# Patient Record
Sex: Female | Born: 1980 | Race: Black or African American | Hispanic: No | Marital: Single | Smoking: Never smoker
Health system: Southern US, Community
[De-identification: ages and names within clinical notes are randomized; demographics above are authoritative.]

## PROBLEM LIST (undated history)

## (undated) DIAGNOSIS — I10 Essential (primary) hypertension: Secondary | ICD-10-CM

## (undated) DIAGNOSIS — D649 Anemia, unspecified: Secondary | ICD-10-CM

## (undated) DIAGNOSIS — K6289 Other specified diseases of anus and rectum: Secondary | ICD-10-CM

## (undated) DIAGNOSIS — R0602 Shortness of breath: Secondary | ICD-10-CM

## (undated) DIAGNOSIS — F419 Anxiety disorder, unspecified: Secondary | ICD-10-CM

## (undated) DIAGNOSIS — A4902 Methicillin resistant Staphylococcus aureus infection, unspecified site: Secondary | ICD-10-CM

## (undated) DIAGNOSIS — F32A Depression, unspecified: Secondary | ICD-10-CM

## (undated) DIAGNOSIS — N390 Urinary tract infection, site not specified: Secondary | ICD-10-CM

## (undated) DIAGNOSIS — O24419 Gestational diabetes mellitus in pregnancy, unspecified control: Secondary | ICD-10-CM

## (undated) DIAGNOSIS — F329 Major depressive disorder, single episode, unspecified: Secondary | ICD-10-CM

## (undated) DIAGNOSIS — B009 Herpesviral infection, unspecified: Secondary | ICD-10-CM

## (undated) DIAGNOSIS — O359XX0 Maternal care for (suspected) fetal abnormality and damage, unspecified, not applicable or unspecified: Secondary | ICD-10-CM

## (undated) HISTORY — PX: OTHER SURGICAL HISTORY: SHX169

## (undated) HISTORY — DX: Anxiety disorder, unspecified: F41.9

## (undated) HISTORY — DX: Major depressive disorder, single episode, unspecified: F32.9

## (undated) HISTORY — DX: Maternal care for (suspected) fetal abnormality and damage, unspecified, not applicable or unspecified: O35.9XX0

## (undated) HISTORY — DX: Anemia, unspecified: D64.9

## (undated) HISTORY — DX: Depression, unspecified: F32.A

## (undated) HISTORY — DX: Essential (primary) hypertension: I10

## (undated) HISTORY — DX: Methicillin resistant Staphylococcus aureus infection, unspecified site: A49.02

---

## 2002-04-22 ENCOUNTER — Encounter: Admission: RE | Admit: 2002-04-22 | Discharge: 2002-04-22 | Payer: Self-pay | Admitting: *Deleted

## 2002-11-07 ENCOUNTER — Emergency Department (HOSPITAL_COMMUNITY): Admission: EM | Admit: 2002-11-07 | Discharge: 2002-11-07 | Payer: Self-pay | Admitting: Emergency Medicine

## 2002-11-08 ENCOUNTER — Encounter: Payer: Self-pay | Admitting: *Deleted

## 2003-02-05 ENCOUNTER — Ambulatory Visit (HOSPITAL_COMMUNITY): Admission: RE | Admit: 2003-02-05 | Discharge: 2003-02-05 | Payer: Self-pay | Admitting: Obstetrics & Gynecology

## 2003-02-05 ENCOUNTER — Encounter: Payer: Self-pay | Admitting: Obstetrics & Gynecology

## 2003-04-30 ENCOUNTER — Encounter: Payer: Self-pay | Admitting: Obstetrics

## 2003-04-30 ENCOUNTER — Inpatient Hospital Stay (HOSPITAL_COMMUNITY): Admission: AD | Admit: 2003-04-30 | Discharge: 2003-04-30 | Payer: Self-pay | Admitting: Obstetrics

## 2003-06-23 ENCOUNTER — Inpatient Hospital Stay (HOSPITAL_COMMUNITY): Admission: AD | Admit: 2003-06-23 | Discharge: 2003-06-23 | Payer: Self-pay | Admitting: Obstetrics

## 2003-07-05 ENCOUNTER — Inpatient Hospital Stay (HOSPITAL_COMMUNITY): Admission: AD | Admit: 2003-07-05 | Discharge: 2003-07-05 | Payer: Self-pay | Admitting: Obstetrics

## 2003-07-07 ENCOUNTER — Ambulatory Visit (HOSPITAL_COMMUNITY): Admission: RE | Admit: 2003-07-07 | Discharge: 2003-07-07 | Payer: Self-pay | Admitting: Obstetrics & Gynecology

## 2003-07-14 ENCOUNTER — Inpatient Hospital Stay (HOSPITAL_COMMUNITY): Admission: AD | Admit: 2003-07-14 | Discharge: 2003-07-19 | Payer: Self-pay | Admitting: Obstetrics

## 2003-07-16 ENCOUNTER — Encounter (INDEPENDENT_AMBULATORY_CARE_PROVIDER_SITE_OTHER): Payer: Self-pay | Admitting: *Deleted

## 2004-01-14 ENCOUNTER — Emergency Department (HOSPITAL_COMMUNITY): Admission: EM | Admit: 2004-01-14 | Discharge: 2004-01-14 | Payer: Self-pay | Admitting: Emergency Medicine

## 2004-07-12 ENCOUNTER — Encounter: Admission: RE | Admit: 2004-07-12 | Discharge: 2004-07-12 | Payer: Self-pay | Admitting: *Deleted

## 2004-08-19 ENCOUNTER — Ambulatory Visit (HOSPITAL_COMMUNITY): Admission: RE | Admit: 2004-08-19 | Discharge: 2004-08-19 | Payer: Self-pay | Admitting: Obstetrics & Gynecology

## 2005-01-06 ENCOUNTER — Inpatient Hospital Stay (HOSPITAL_COMMUNITY): Admission: RE | Admit: 2005-01-06 | Discharge: 2005-01-09 | Payer: Self-pay | Admitting: Obstetrics & Gynecology

## 2007-06-26 ENCOUNTER — Ambulatory Visit: Payer: Self-pay | Admitting: Internal Medicine

## 2007-11-27 ENCOUNTER — Emergency Department (HOSPITAL_COMMUNITY): Admission: EM | Admit: 2007-11-27 | Discharge: 2007-11-28 | Payer: Self-pay | Admitting: Emergency Medicine

## 2008-11-13 ENCOUNTER — Emergency Department: Payer: Self-pay | Admitting: Emergency Medicine

## 2009-03-07 ENCOUNTER — Observation Stay: Payer: Self-pay | Admitting: Obstetrics and Gynecology

## 2009-04-29 ENCOUNTER — Ambulatory Visit (HOSPITAL_COMMUNITY): Admission: RE | Admit: 2009-04-29 | Discharge: 2009-04-29 | Payer: Self-pay | Admitting: Obstetrics & Gynecology

## 2009-05-04 ENCOUNTER — Inpatient Hospital Stay (HOSPITAL_COMMUNITY): Admission: AD | Admit: 2009-05-04 | Discharge: 2009-05-04 | Payer: Self-pay | Admitting: Obstetrics & Gynecology

## 2009-05-18 ENCOUNTER — Encounter: Payer: Self-pay | Admitting: Obstetrics & Gynecology

## 2009-05-18 ENCOUNTER — Inpatient Hospital Stay (HOSPITAL_COMMUNITY): Admission: RE | Admit: 2009-05-18 | Discharge: 2009-05-21 | Payer: Self-pay | Admitting: Obstetrics & Gynecology

## 2009-05-22 ENCOUNTER — Encounter: Admission: RE | Admit: 2009-05-22 | Discharge: 2009-06-20 | Payer: Self-pay | Admitting: Obstetrics & Gynecology

## 2009-06-21 ENCOUNTER — Encounter: Admission: RE | Admit: 2009-06-21 | Discharge: 2009-07-08 | Payer: Self-pay | Admitting: Obstetrics & Gynecology

## 2009-07-22 ENCOUNTER — Encounter: Admission: RE | Admit: 2009-07-22 | Discharge: 2009-08-21 | Payer: Self-pay | Admitting: Obstetrics & Gynecology

## 2009-08-22 ENCOUNTER — Encounter: Admission: RE | Admit: 2009-08-22 | Discharge: 2009-08-26 | Payer: Self-pay | Admitting: Obstetrics & Gynecology

## 2009-09-24 ENCOUNTER — Emergency Department (HOSPITAL_COMMUNITY): Admission: EM | Admit: 2009-09-24 | Discharge: 2009-09-24 | Payer: Self-pay | Admitting: Emergency Medicine

## 2009-10-04 ENCOUNTER — Emergency Department: Payer: Self-pay | Admitting: Emergency Medicine

## 2009-12-04 ENCOUNTER — Emergency Department: Payer: Self-pay | Admitting: Emergency Medicine

## 2009-12-31 ENCOUNTER — Ambulatory Visit: Payer: Self-pay | Admitting: Family Medicine

## 2010-03-17 ENCOUNTER — Emergency Department: Payer: Self-pay | Admitting: Emergency Medicine

## 2010-07-10 DIAGNOSIS — O359XX0 Maternal care for (suspected) fetal abnormality and damage, unspecified, not applicable or unspecified: Secondary | ICD-10-CM

## 2010-07-10 HISTORY — DX: Maternal care for (suspected) fetal abnormality and damage, unspecified, not applicable or unspecified: O35.9XX0

## 2010-07-30 ENCOUNTER — Encounter: Payer: Self-pay | Admitting: Obstetrics & Gynecology

## 2010-08-01 ENCOUNTER — Encounter: Payer: Self-pay | Admitting: Obstetrics & Gynecology

## 2010-10-02 LAB — LIPASE, BLOOD: Lipase: 37 U/L (ref 11–59)

## 2010-10-02 LAB — URINALYSIS, ROUTINE W REFLEX MICROSCOPIC
Bilirubin Urine: NEGATIVE
Ketones, ur: NEGATIVE mg/dL
Nitrite: NEGATIVE
Protein, ur: NEGATIVE mg/dL
Urobilinogen, UA: 0.2 mg/dL (ref 0.0–1.0)

## 2010-10-02 LAB — COMPREHENSIVE METABOLIC PANEL
ALT: 23 U/L (ref 0–35)
Albumin: 3.8 g/dL (ref 3.5–5.2)
Calcium: 9.5 mg/dL (ref 8.4–10.5)
GFR calc Af Amer: >60 mL/min (ref >60)
GFR calc non Af Amer: >60 mL/min (ref >60)
Glucose, Bld: 129 mg/dL — ABNORMAL HIGH (ref 70–99)
Sodium: 137 mEq/L (ref 135–145)
Total Protein: 7 g/dL (ref 6.0–8.3)

## 2010-10-02 LAB — DIFFERENTIAL
Basophils Absolute: 0 10*3/uL (ref 0.0–0.1)
Basophils Relative: 0 % (ref 0–1)
Eosinophils Relative: 4 % (ref 0–5)
Lymphocytes Relative: 35 % (ref 12–46)
Neutro Abs: 3.9 10*3/uL (ref 1.7–7.7)

## 2010-10-02 LAB — CBC
Hemoglobin: 12.1 g/dL (ref 12.0–15.0)
MCHC: 32.8 g/dL (ref 30.0–36.0)
Platelets: 244 10*3/uL (ref 150–400)
RDW: 16 % — ABNORMAL HIGH (ref 11.5–15.5)

## 2010-10-11 LAB — CBC: Hemoglobin: 11.5 g/dL — AB (ref 12.0–16.0)

## 2010-10-11 LAB — HEPATITIS B SURFACE ANTIGEN: Hepatitis B Surface Ag: NEGATIVE

## 2010-10-11 LAB — RPR: RPR: NONREACTIVE

## 2010-10-11 LAB — GC/CHLAMYDIA PROBE AMP, GENITAL: Chlamydia: NEGATIVE

## 2010-10-11 LAB — ABO/RH

## 2010-10-11 LAB — HIV ANTIBODY (ROUTINE TESTING W REFLEX): HIV: NONREACTIVE

## 2010-10-12 LAB — CBC
HCT: 25.4 % — ABNORMAL LOW (ref 36.0–46.0)
HCT: 31.6 % — ABNORMAL LOW (ref 36.0–46.0)
Hemoglobin: 8.3 g/dL — ABNORMAL LOW (ref 12.0–15.0)
MCHC: 33.2 g/dL (ref 30.0–36.0)
MCV: 78.3 fL (ref 78.0–100.0)
MCV: 79 fL (ref 78.0–100.0)
RBC: 3.22 MIL/uL — ABNORMAL LOW (ref 3.87–5.11)
RBC: 4.04 MIL/uL (ref 3.87–5.11)
WBC: 11.7 10*3/uL — ABNORMAL HIGH (ref 4.0–10.5)
WBC: 11.7 10*3/uL — ABNORMAL HIGH (ref 4.0–10.5)

## 2010-10-12 LAB — GLUCOSE, CAPILLARY: Glucose-Capillary: 89 mg/dL (ref 70–99)

## 2010-10-12 LAB — RPR: RPR Ser Ql: NONREACTIVE

## 2010-10-12 LAB — TYPE AND SCREEN: Antibody Screen: NEGATIVE

## 2010-10-13 LAB — URINALYSIS, ROUTINE W REFLEX MICROSCOPIC
Bilirubin Urine: NEGATIVE
Glucose, UA: NEGATIVE mg/dL
Hgb urine dipstick: NEGATIVE
Ketones, ur: NEGATIVE mg/dL
Nitrite: NEGATIVE
Specific Gravity, Urine: 1.015 (ref 1.005–1.030)
pH: 7.5 (ref 5.0–8.0)

## 2010-10-13 LAB — GLUCOSE, CAPILLARY: Glucose-Capillary: 88 mg/dL (ref 70–99)

## 2010-10-13 LAB — URINE MICROSCOPIC-ADD ON

## 2010-10-23 ENCOUNTER — Emergency Department: Payer: Self-pay | Admitting: Emergency Medicine

## 2010-10-28 ENCOUNTER — Emergency Department: Payer: Self-pay | Admitting: Emergency Medicine

## 2010-11-25 NOTE — Op Note (Signed)
Lynn Vargas, [place] NO.:  [id number]   MEDICAL RECORD NO.:  [id number]          PATIENT TYPE:  INP   LOCATION:  9199                          FACILITY:  [facility]   PHYSICIAN:  Roseanna Rainbow, M.D.DATE OF BIRTH:  07/10/1981   DATE OF PROCEDURE:  01/06/2005  DATE OF DISCHARGE:                                 OPERATIVE REPORT   PREOPERATIVE DIAGNOSES:  Term intrauterine pregnancy, history of a previous  cesarean delivery, declines trial of labor.   POSTOPERATIVE DIAGNOSIS:  Term intrauterine pregnancy, history of a previous  cesarean delivery, declines trial of labor.   PROCEDURE:  Repeat low transverse cesarean section via Pfannenstiel.   SURGEON:  1.  Dr. Tamela Oddi.  2.  Dr. Clearance Coots   ANESTHESIA:  Spinal.   COMPLICATIONS:  None.   ESTIMATED BLOOD LOSS:  80-100 mL.   FLUIDS AND URINE OUTPUT:  As per anesthesiology.   INDICATIONS:  The patient is a 30 year old para 1 with an intrauterine  pregnancy at term for an elective cesarean delivery.   FINDINGS:  Infant in cephalic presentation.  Neonatology present at  delivery.   PROCEDURES:  The patient was taken to the operating room where spinal  anesthetic was administered and found to be adequate. She was then prepped  and draped in the usual sterile fashion in the dorsal supine position with a  leftward tilt.  A Pfannenstiel skin incision was then made with the scalpel  and carried through to the underlying layer of fascia. The fascia was nicked  in the midline and the incision extended laterally with Mayo scissors.  The  superior aspect of the fascial incision was then grasped with the Kocher  clamps, elevated, and the underlying rectus muscles dissected off.  The  inferior aspect of the incision was manipulated in a similar fashion.  The  rectus muscles were separated in the midline.  The parietoperitoneum was  tented up and entered sharply with Metzenbaum scissors.  The peritoneal  incision  was then extended superiorly and inferiorly with good visualization  of the bladder.  The bladder blade was then inserted and the vesicouterine  peritoneum identified, grasped with pickups, and entered sharply with  Metzenbaum scissors.  This incision was then extended laterally and the  bladder flap created sharply.  The bladder blade was then reinserted and the  lower uterine segment incised in a transverse fashion with the scalpel.  The  uterine incision was then extended bluntly.  The bladder blade was removed  and the infant's head delivered atraumatically.  The nose and mouth were  suctioned with bulb suction and the cord was clamped and cut. The infant was  handed off to the awaiting neonatologist.  The placenta was then removed.  The uterus was cleared of all clots and debris.  The uterine incision was  repaired with O Monocryl in a running locked fashion.  A second layer of the  same suture was used to obtain hemostasis.  The gutters were cleared of all  clots.  The peritoneum was closed with 2-0 Vicryl.  The fascia was  reapproximated  with O PDS in a running fashion.  The skin was closed in a  subcuticular fashion with 3-0 Monocryl.  The patient tolerated the procedure  well.  Sponge, lap, and needle counts were correct x2.  One gram of  cephazolin was given at cord clamp.  The patient was taken to the PACU in  stable condition.       LAJ/MEDQ  D:  01/06/2005  T:  01/06/2005  Job:  [id number]

## 2010-11-25 NOTE — Op Note (Signed)
NAME:  Lynn Vargas, Lynn Vargas NO.:  [id number]   MEDICAL RECORD NO.:  [id number]                   PATIENT TYPE:  INP   LOCATION:  9111                                 [facility]  [facility]   PHYSICIAN:  Roseanna Rainbow, M.D.         DATE OF BIRTH:  [birth year]   DATE OF PROCEDURE:  07/16/2003  DATE OF DISCHARGE:                                 OPERATIVE REPORT   PREOPERATIVE DIAGNOSES:  1. Forty-one plus week intrauterine pregnancy.  2. Failed induction.  3. Meconium-stained amniotic fluid.   POSTOPERATIVE DIAGNOSES:  1. Forty-one plus week intrauterine pregnancy.  2. Failed induction.  3. Meconium-stained amniotic fluid.   PROCEDURE:  Primary low transverse cesarean section via Pfannenstiel.   SURGEON:  Roseanna Rainbow, M.D.   ANESTHESIA:  Epidural.   COMPLICATIONS:  None.   ESTIMATED BLOOD LOSS:  1 L.   Fluids and urine output as per anesthesiology.   INDICATIONS:  The patient is a 30 year old G1, P0, at 41+ weeks, induced for  post dates.  Maximum dilatation 2 cm.   FINDINGS:  A female infant in cephalic presentation.  Neonatology present at  delivery.  Normal uterus, tubes, and ovaries.   DESCRIPTION OF PROCEDURE:  The patient was taken to the operating room,  where her epidural anesthetic was found to be adequate.  She was then  prepped and draped in the usual sterile fashion in the dorsal supine  position with a leftward tilt.  A Pfannenstiel skin incision was then made  with the scalpel and carried through to the underlying layer of fascia.  The  fascia was then incised in the midline and the incision extended laterally  with Mayo scissors.  The superior aspect of the fascial incision was then  grasped with Kocher clamps, elevated, and the underlying rectus muscles  dissected off.  Attention was then turned to the inferior aspect of this  incision, which was manipulated in a similar fashion.  The rectus muscles  were separated in the  midline.  The parietal peritoneum was identified,  tented up, and entered sharply with the Metzenbaum scissors.  The peritoneal  incision was then extended superiorly and inferiorly with good visualization  of the bladder.  The bladder blade was then inserted and the vesicouterine  peritoneum, identified, grasped with pickups, and entered sharply with the  Metzenbaum scissors.  This incision was then extended laterally and the  bladder flap created digitally.  The bladder blade was then reinserted in  the lower uterine segment, incised in a transverse fashion with the scalpel.  The uterine incision was then extended laterally with the bandage scissors.  The bladder blade was then removed and the infant's head delivered  atraumatically.  The nose and mouth were suctioned with a DeLee suction trap  and the cord was clamped and cut.  The infant was handed off to the awaiting  neonatologist.  Cord gases were sent.  The  placenta was then removed, the  uterus cleared of all clots and debris.  The uterine incision was repaired  with 0 Monocryl in a running locked fashion.  A second layer of the same  suture was used to obtain adequate hemostasis.  The gutters were cleared of  all clots.  The parietal peritoneum was closed with 2-0 Vicryl.  The fascia  was reapproximated with 0 Vicryl in a running fashion.  The skin was closed  with staples.  The patient tolerated the procedure well.  Sponge, lap, and  needle counts were correct x2.  Cefazolin 1 g was given at cord clamp.  The  patient was taken to the PACU in stable condition.                                               Roseanna Rainbow, M.D.    Judee Clara  D:  07/16/2003  T:  07/16/2003  Job:  [id number]

## 2010-11-25 NOTE — Discharge Summary (Signed)
Lynn Vargas, Lynn Vargas                ACCOUNT NO.:  [id number]   MEDICAL RECORD NO.:  [id number]          PATIENT TYPE:  INP   LOCATION:  9117                          [facility]   PHYSICIAN:  Charles A. Clearance Coots, M.D.DATE OF BIRTH:  [birth year]   DATE OF ADMISSION:  01/06/2005  DATE OF DISCHARGE:                                 DISCHARGE SUMMARY   ADMITTING DIAGNOSES:  1.  Thirty and six-sevenths weeks gestation.  2.  Previous cesarean section, desired repeat cesarean section.   DISCHARGE DIAGNOSES:  1.  Thirty and six-sevenths weeks gestation.  2.  Previous cesarean section, desired repeat cesarean section.  3.  Status post delivery by cesarean section, viable female infant, on December 27, 2004 at 11:08. Apgars of 9 at one minute, 9 at five minutes, weight      of 2990 g, length of 47 cm. Mother and infant discharged home in good      condition.   REASON FOR ADMISSION:  A 30 year old black female G3 P1 at 93 and six-  sevenths weeks gestation. History of previous cesarean section, desired  repeat cesarean section. Prenatal care was uncomplicated except for history  of positive herpes.   PAST MEDICAL HISTORY:  Surgery:  Cesarean section and pilonidal cyst  resection. Illnesses:  Herpes. Medications:  Prenatal vitamins, Valtrex.  Allergies:  No known drug allergies.   SOCIAL HISTORY:  Single. Negative tobacco, alcohol, or recreational drug  use.   PHYSICAL EXAMINATION:  VITAL SIGNS:  Afebrile, blood pressure 130/84, weight  226 pounds.  LUNGS:  Clear to auscultation bilaterally.  HEART:  Regular rate and rhythm.  ABDOMEN:  Gravid, nontender.  PELVIC:  Cervix is long and closed.   ADMITTING LABORATORY VALUES:  Hemoglobin 10.4, hematocrit 30. Rubella  nonimmune.   HOSPITAL COURSE:  The patient underwent a repeat low transverse cesarean  section on January 06, 2005. There were no intraoperative complications.  Postoperative course was uncomplicated and the patient  was discharged home  on postoperative day #3 in good condition.   DISCHARGE LABORATORY VALUES:  Hemoglobin 9.0, hematocrit 26.5.   DISCHARGE DISPOSITION:  1.  Medications:  Tylox, ibuprofen, and Valtrex were prescribed.  2.  Routine written instructions for obstetrical discharge after cesarean      section was given by booklet.  3.  The patient is to call the office for a follow-up appointment in 2      weeks.       CAH/MEDQ  D:  01/09/2005  T:  01/09/2005  Job:  [id number]

## 2010-11-25 NOTE — Discharge Summary (Signed)
NAME:  Lynn Vargas, Lynn Vargas NO.:  [id number]   MEDICAL RECORD NO.:  [id number]                   PATIENT TYPE:  INP   LOCATION:  9111                                 [facility]   PHYSICIAN:  Roseanna Rainbow, M.D.         DATE OF BIRTH:  28-Aug-1980   DATE OF ADMISSION:  07/14/2003  DATE OF DISCHARGE:  07/19/2003                                 DISCHARGE SUMMARY   CHIEF COMPLAINT:  The patient is a 30 year old, gravida 2, para 0, with an  intrauterine pregnancy at 41-3/7 weeks who presents for induction of labor  secondary to post dates.   HISTORY OF PRESENT ILLNESS:  As above. The patient denies any contractions,  rupture of membranes, vaginal bleeding. She of course has good fetal  movement, prenatal course, onset of care at 10 weeks. Pregnancy,  complications and risks, sickle cell trait, history of genital herpes, and  GBS bacteruria.   MEDICATIONS:  1. Prenatal vitamins.  2. Valtrex.  3. Medigesic.   ALLERGIES:  No known drug allergies.   PAST OBSTETRICS HISTORY:  Spontaneous abortion first trimester.   PAST GYNECOLOGIC HISTORY:  See above.   PAST MEDICAL HISTORY:  Gastritis.   PAST SURGICAL HISTORY:  Pilonidal cystectomy.   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 [place]   PRENATAL LABORATORY:  O positive, antibody screen negative, rubella immune,  hepatitis B surface antigen negative. With recent ultrasound at 31 weeks,  estimated fetal weight percentile of 75th, normal AFI.   PHYSICAL EXAMINATION:  VITAL SIGNS: Stable and afebrile.  GENERAL: Well-developed, in no apparent distress.  ABDOMEN: Gravida and nontender.  PELVIC EXAM: Deferred. Fetal  heart rate baseline 140s, good long term  variability, reactive; mild variable decelerations. No contractions.   ASSESSMENT:  Intrauterine pregnancy at 41+ weeks.   PLAN:  Induction of labor.   HOSPITAL COURSE:  The patient was admitted. The cervix was ripened  with  prostaglandin. Artificial rupture of membranes was performed, late in labor,  and meconium stained fluid was noted.  An amnio-infusion was started in  labor.  Despite appropriate Pitocin administration and adequate amount of  __________ units, the patient failed to progress beyond 3 cm.  At this point  the patient was taken to the operating room for Cesarean delivery secondary  to failed induction of labor.  Please see the dictated operative summary for  further details.  Her postoperative course was remarkable for mild  tachycardia, mild anemia with a hemoglobin of 8.6 on postoperative day #1.  EKG and oxygen saturations were normal. She was discharged to home on  postoperative day #3, tolerating a regular diet.   DISCHARGE DIAGNOSES:  1. Intrauterine pregnancy at 41+ weeks.  2. Failed induction of labor.   PROCEDURE:  Cesarean delivery.   CONDITION:  Stable.   DIET:  Regular.   ACTIVITY:  No strenuous activity.  Pelvic rest.   MEDICATIONS:  Percocet  one to two tablets q.6h. p.r.n.   DISPOSITION:  The patient is to follow up in the office in one week.                                               Roseanna Rainbow, M.D.    Judee Clara  D:  09/03/2003  T:  09/03/2003  Job:  [id number]

## 2011-01-18 ENCOUNTER — Inpatient Hospital Stay (HOSPITAL_COMMUNITY)
Admission: EM | Admit: 2011-01-18 | Discharge: 2011-01-18 | Disposition: A | Discharge status: Home / Self Care | Payer: BC Managed Care – PPO | Source: Ambulatory Visit | Attending: Obstetrics | Admitting: Obstetrics

## 2011-01-18 ENCOUNTER — Encounter (HOSPITAL_COMMUNITY): Payer: Self-pay

## 2011-01-18 ENCOUNTER — Inpatient Hospital Stay (HOSPITAL_COMMUNITY): Admission: EM | Admit: 2011-01-18 | Payer: Self-pay | Source: Ambulatory Visit | Admitting: Obstetrics

## 2011-01-18 DIAGNOSIS — R109 Unspecified abdominal pain: Secondary | ICD-10-CM | POA: Insufficient documentation

## 2011-01-18 DIAGNOSIS — N949 Unspecified condition associated with female genital organs and menstrual cycle: Secondary | ICD-10-CM

## 2011-01-18 LAB — URINALYSIS, ROUTINE W REFLEX MICROSCOPIC
Bilirubin Urine: NEGATIVE
Ketones, ur: NEGATIVE mg/dL
Leukocytes, UA: NEGATIVE
Nitrite: NEGATIVE
Protein, ur: NEGATIVE mg/dL
pH: 6.5 (ref 5.0–8.0)

## 2011-01-18 LAB — URINE MICROSCOPIC-ADD ON

## 2011-01-18 NOTE — Progress Notes (Signed)
Pt states that she is having more cramping than usual with "pain". States she did have some leaking "fluid" this am but not now.

## 2011-01-18 NOTE — ED Provider Notes (Signed)
History   Pt presents today c/o lower abd cramping that is worse with movement. She states the pain comes and goes and is especially bad after working all day. When she rests, the pain improves. She denies vag dc, bleeding, fever, or any other sx at this time.  Chief Complaint  Patient presents with  . Abdominal Pain   HPI  OB History    Grav Para Term Preterm Abortions TAB SAB Ect Mult Living   4 3 3  0 0 0 0 0 0 3      No past medical history on file.  Past Surgical History  Procedure Date  . Cesarean section     Family History  Problem Relation Age of Onset  . Asthma Mother   . Diabetes Son     History  Substance Use Topics  . Smoking status: Never Smoker   . Smokeless tobacco: Never Used  . Alcohol Use: No    Allergies:  Allergies  Allergen Reactions  . Latex     rash    No prescriptions prior to admission    Review of Systems  Constitutional: Negative for fever and chills.  Cardiovascular: Negative for chest pain.  Gastrointestinal: Positive for abdominal pain. Negative for nausea, vomiting and diarrhea.  Genitourinary: Negative for dysuria, urgency, frequency and hematuria.  Neurological: Negative for dizziness and headaches.  Psychiatric/Behavioral: Negative for depression and suicidal ideas.   Physical Exam   Blood pressure 112/67, pulse 99, temperature 98.6 F (37 C), temperature source Oral, resp. rate 20, height 5\' 2"  (1.575 m), weight 237 lb 9.6 oz (107.775 kg).  Physical Exam  Constitutional: She is oriented to person, place, and time. She appears well-developed and well-nourished.  HENT:  Head: Normocephalic and atraumatic.  GI: She exhibits no distension. There is no tenderness. There is no rebound and no guarding.  Genitourinary: No bleeding around the vagina. No vaginal discharge found.       Cervix Lg/closed.  Neurological: She is alert and oriented to person, place, and time.  Skin: Skin is warm and dry.  Psychiatric: She has a  normal mood and affect. Her behavior is normal. Judgment and thought content normal.    MAU Course  Procedures  Results for orders placed during the hospital encounter of 01/18/11 (from the past 24 hour(s))  URINALYSIS, ROUTINE W REFLEX MICROSCOPIC     Status: Abnormal   Collection Time   01/18/11  3:11 PM      Component Value Range   Color, Urine STRAW (*) YELLOW    Appearance CLEAR  CLEAR    Specific Gravity, Urine 1.010  1.005 - 1.030    pH 6.5  5.0 - 8.0    Glucose, UA 100 (*) NEGATIVE (mg/dL)   Hgb urine dipstick TRACE (*) NEGATIVE    Bilirubin Urine NEGATIVE  NEGATIVE    Ketones NEGATIVE  NEGATIVE (mg/dL)   Protein NEGATIVE  NEGATIVE (mg/dL)   Urobilinogen, UA 0.2  0.0 - 1.0 (mg/dL)   Nitrite NEGATIVE  NEGATIVE    Leukocytes, UA NEGATIVE  NEGATIVE   URINE MICROSCOPIC-ADD ON     Status: Abnormal   Collection Time   01/18/11  3:11 PM      Component Value Range   Squamous Epithelial / LPF FEW (*) RARE    WBC, UA 0-2  <3 (WBC/hpf)   Bacteria, UA RARE  RARE     A/P: 1) Round ligament pain: discussed with pt at length. She has f/u scheduled with Dr. Verdell Carmine  office. Discussed diet, activity, risks, and precautions.    Henrietta Hoover, [place] 01/18/11 1621

## 2011-01-18 NOTE — Progress Notes (Signed)
Patient complaints of increased pain and cramping.Pt states she has had pain before but feels in has been worse today.Pt indicates cramping in her lower abdomen and pain radiating across her right side. Pt denies vaginal bleeding

## 2011-04-04 ENCOUNTER — Observation Stay: Payer: Self-pay

## 2011-04-05 LAB — URINALYSIS, ROUTINE W REFLEX MICROSCOPIC
Bilirubin Urine: NEGATIVE
Glucose, UA: NEGATIVE
Nitrite: NEGATIVE
Specific Gravity, Urine: 1.014
pH: 6.5

## 2011-04-05 LAB — COMPREHENSIVE METABOLIC PANEL
ALT: 18
AST: 17
Albumin: 3.5
Alkaline Phosphatase: 30 — ABNORMAL LOW
Chloride: 105
Potassium: 3.8
Sodium: 136
Total Protein: 6.5

## 2011-04-05 LAB — DIFFERENTIAL
Basophils Relative: 0
Eosinophils Absolute: 0.2
Eosinophils Relative: 3
Monocytes Absolute: 0.5
Monocytes Relative: 6

## 2011-04-05 LAB — CBC
Platelets: 178
RDW: 12.6
WBC: 7.8

## 2011-04-05 LAB — HCG, QUANTITATIVE, PREGNANCY: hCG, Beta Chain, Quant, S: 39751 — ABNORMAL HIGH

## 2011-04-05 LAB — LIPASE, BLOOD: Lipase: 32

## 2011-04-05 LAB — POCT PREGNANCY, URINE: Preg Test, Ur: POSITIVE

## 2011-04-11 ENCOUNTER — Encounter (HOSPITAL_COMMUNITY): Payer: Self-pay

## 2011-04-11 ENCOUNTER — Inpatient Hospital Stay (HOSPITAL_COMMUNITY)
Admission: AD | Admit: 2011-04-11 | Discharge: 2011-04-12 | DRG: 886 | Disposition: A | Discharge status: Other Acute Inpatient Hospital | Payer: BC Managed Care – PPO | Source: Ambulatory Visit | Attending: Obstetrics & Gynecology | Admitting: Obstetrics & Gynecology

## 2011-04-11 ENCOUNTER — Ambulatory Visit: Payer: Self-pay | Admitting: Obstetrics & Gynecology

## 2011-04-11 DIAGNOSIS — O358XX0 Maternal care for other (suspected) fetal abnormality and damage, not applicable or unspecified: Secondary | ICD-10-CM | POA: Diagnosis present

## 2011-04-11 DIAGNOSIS — IMO0002 Reserved for concepts with insufficient information to code with codable children: Secondary | ICD-10-CM | POA: Diagnosis present

## 2011-04-11 DIAGNOSIS — O99810 Abnormal glucose complicating pregnancy: Secondary | ICD-10-CM | POA: Diagnosis present

## 2011-04-11 DIAGNOSIS — O34219 Maternal care for unspecified type scar from previous cesarean delivery: Secondary | ICD-10-CM | POA: Diagnosis present

## 2011-04-11 DIAGNOSIS — O409XX0 Polyhydramnios, unspecified trimester, not applicable or unspecified: Principal | ICD-10-CM | POA: Diagnosis present

## 2011-04-11 HISTORY — DX: Other specified diseases of anus and rectum: K62.89

## 2011-04-11 HISTORY — DX: Shortness of breath: R06.02

## 2011-04-11 HISTORY — DX: Gestational diabetes mellitus in pregnancy, unspecified control: O24.419

## 2011-04-11 HISTORY — DX: Herpesviral infection, unspecified: B00.9

## 2011-04-11 HISTORY — DX: Urinary tract infection, site not specified: N39.0

## 2011-04-11 MED ORDER — CALCIUM CARBONATE ANTACID 500 MG PO CHEW
1.0000 | CHEWABLE_TABLET | Freq: Three times a day (TID) | ORAL | Status: DC | PRN
  Administered 2011-04-11: 200 mg via ORAL
  Filled 2011-04-11: qty 1

## 2011-04-11 MED ORDER — ZOLPIDEM TARTRATE 5 MG PO TABS
5.0000 mg | ORAL_TABLET | Freq: Every evening | ORAL | Status: DC | PRN
  Administered 2011-04-11: 5 mg via ORAL
  Filled 2011-04-11: qty 1

## 2011-04-11 NOTE — ED Provider Notes (Signed)
History     No chief complaint on file.  HPI  Pt is 34 weeks 1 day pregnant and had an ultrasound today at [facility] and was told to come here because she had too much fluid.  She was treated for a UTI a couple weeks ago, she was asymptomatic.  She is on Penicillin.   She was unable to complete the Glucose screening due to vomiting the glucola.   She had gestational diabetes with her previous pregnancy Nov 2010.  She has had elevated BP.  She denies headaches.  She has had nausea throughout her pregnancy.  She last ate at 1 pm - salad, corn, roasted [place] and french fries and orange ade.  She had some leakage of fluid last night -pinkish discharge.  She had some lower abdominal pain last night which has resolved.  She denies constipation or diarrhea.    Past Medical History  Diagnosis Date  . Gestational diabetes     diet controlled  . Shortness of breath   . UTI (lower urinary tract infection)   . 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  . Cyst of perianal area     removed    Past Surgical History  Procedure Date  . Cesarean section     Family History  Problem Relation Age of Onset  . Asthma Mother   . Diabetes Son     History  Substance Use Topics  . Smoking status: Never Smoker   . Smokeless tobacco: Never Used  . Alcohol Use: No    Allergies:  Allergies  Allergen Reactions  . Latex     rash    Prescriptions prior to admission  Medication Sig Dispense Refill  . Multiple Vitamins-Minerals (MULTI-VITAMIN GUMMIES PO) Take 1 tablet by mouth daily.        . penicillin v potassium (VEETID) 250 MG tablet Take 250 mg by mouth 4 (four) times daily. For urinary tract infection       . zolpidem (AMBIEN) 10 MG tablet Take 10 mg by mouth at bedtime as needed. For sleep         ROS Physical Exam   Blood pressure 140/101, pulse 114, temperature 98.5 F (36.9 C), temperature source Oral, resp. rate 24, height 5' 2.5" (1.588 m),  weight 249 lb 12.8 oz (113.309 kg), SpO2 100.00%.  Physical Exam  MAU Course  Procedures  MDM   Assessment and Plan  Polyhydramnious@34  weeks 1 day with fetal anomaly for direct admit per Dr. Tamela Oddi   [facility] 04/11/2011, 5:15 PM

## 2011-04-11 NOTE — Progress Notes (Signed)
Patient states that she is because dr Tamela Oddi asked to come to [facility]. She had an ultrasound at [facility] and was told that she had too much amniotic fluids. She c/o constant back pain and intermittent pelvic pain. She states that she started having pinkish vaginal discharge yesterday. She decreased fetal movement

## 2011-04-11 NOTE — H&P (Signed)
This is Dr. Francoise Ceo dictating the history and physical on  Lynn Vargas she's a 30 year old gravida 6 para 3023 at 34 weeks and 1 day She was admitted by Dr. 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

## 2011-04-11 NOTE — Progress Notes (Signed)
Pt states she had an ultrasound at [facility] and has polyhydramnios and sent to MAU for evaluation.

## 2011-04-11 NOTE — Progress Notes (Signed)
Dr Gaynell Face notified of patient and that dr Tamela Oddi states to call him. Order received to admit patient to antenatal unit. mfm consult tomorrow for further plan of care.

## 2011-04-12 ENCOUNTER — Inpatient Hospital Stay (HOSPITAL_COMMUNITY): Payer: BC Managed Care – PPO

## 2011-04-12 DIAGNOSIS — IMO0002 Reserved for concepts with insufficient information to code with codable children: Secondary | ICD-10-CM | POA: Diagnosis present

## 2011-04-12 NOTE — Initial Assessments (Signed)
Lynn Vargas discussing findings of ultrasound, options for pt, plan of care, pt's questions answered, pt and fob verbalized understanding

## 2011-04-12 NOTE — Discharge Summary (Signed)
  Physician Discharge Summary  Patient ID: Lynn Vargas MRN: [id number] DOB/AGE: [birth year] 30 y.o.  Admit date: 04/11/2011 Discharge date: 04/12/2011  Admission Diagnoses:Principal Problem:  *Fetal tumor Active Problems:  Polyhydramnios, maternal, antepartum    Discharge Diagnoses:  Principal Problem:  *Fetal tumor Active Problems:  Polyhydramnios, maternal, antepartum   Discharged Condition: stable  Hospital Course: The patient was admitted.  Her O2 saturations remained in range.  The FHT was reassuring.  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  Consults: MFM  Significant Diagnostic Studies: radiology: Ultrasound: See above    Discharge Exam: Blood pressure 126/90, pulse 102, temperature 98.3 F (36.8 C), temperature source Oral, resp. rate 20, height 5' 2.5" (1.588 m), weight 113.309 kg (249 lb 12.8 oz), SpO2 100.00%. General appearance: alert and no distress Resp: clear to auscultation bilaterally  Disposition: Transfer to [facility]  Discharge Orders    Future Orders Please Complete By Expires   HIV antibody      Comments:   This external order was created through the Results Console.   GC/chlamydia probe amp, genital      Comments:   This external order was created through the Results Console.   CBC      Comments:   This external order was created through the Results Console.   Rubella antibody, IgM      Comments:   This external order was created through the Results Console.   Hepatitis B surface antigen      Comments:   This external order was created through the Results Console.   RPR      Comments:   This external order was created through the Results Console.   ABO/Rh      Comments:   This external order was created through the Results Console.     Current Discharge Medication List      STOP taking these medications     Multiple Vitamins-Minerals (MULTI-VITAMIN GUMMIES PO)      penicillin v potassium (VEETID) 250 MG tablet      zolpidem (AMBIEN) 10 MG tablet          Signed: JACKSON-MOORE,Tahari Clabaugh A 04/12/2011, 1:02 PM

## 2011-04-12 NOTE — Progress Notes (Signed)
MATERNAL FETAL MEDICINE CONSULT  Patient Name: Lynn Vargas Medical Record Number:  [id number] Date of Birth: May 22, 1981 Requesting Physician Name:  Roseanna Rainbow, MD Date of Service: 04/12/2011  Chief Complaint Fetal neck mass  History of Present Illness Lynn Vargas was seen today for prenatal diagnosis secondary to a fetal tumor mass at the request of Dr. Tamela Oddi  The patient is a 30 y.o. [id number], with an EDD of 05/22/2011, by Other Basis dating method.  She had a follow-up U/S study yesterday at an outside facility because of increasing fundal size.  The study demonstrated an appropriately grown fetus for [redacted] weeks gestation, polyhydramnios to >40 cm, and a solid appearing mass that appears to arise from the fetal neck.  Review of Systems Pertinent items are noted in HPI.  Patient History OB History    Grav Para Term Preterm Abortions TAB SAB Ect Mult Living   6 3 3  0 2 2 0 0 0 3     # Outc Date GA Lbr Len/2nd Wgt Sex Del Anes PTL Lv   1 TRM            2 TRM            3 TRM            4 TAB            5 TAB            6 CUR               Past Medical History  Diagnosis Date  . Gestational diabetes     diet controlled  . Shortness of breath   . UTI (lower urinary tract infection)   . 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  . Cyst of perianal area     removed    Past Surgical History  Procedure Date  . Cesarean section     History   Social History  . Marital Status: Married    Spouse Name: N/A    Number of Children: N/A  . Years of Education: N/A   Social History Main Topics  . Smoking status: Never Smoker   . Smokeless tobacco: Never Used  . Alcohol Use: No  . Drug Use: No  . Sexually Active: Not Currently   Other Topics Concern  . None   Social History Narrative  . None    Family History  Problem Relation Age of Onset  . Asthma Mother   . Diabetes Son    In addition, the patient has no family  history of mental retardation, birth defects, or genetic diseases.  Physical Examination Filed Vitals:   04/12/11 0832  BP: 126/90  Pulse: 102  Temp:   Resp: 20   General appearance - alert, well appearing, and in no distress, anxious, crying and upset Mental status - alert, oriented to person, place, and time.  Ultrasound findings are scanned in AS/EPIC.  Polyhydramnios is confirmed and an exophytic appearing mass appears to arise from the fetal right orbital fossa with primary blood supply from the ocular artery region.  There does not appear to be extensive involvement of the intracranial region with no deviation of the midline falx. The fetus appears to be swallowing and moving fluid from the nose and mouth on U/S study.  It does not appear that there is present respiratory obstruction, but MRI is recommended to delineate the source and extent of the  tumor in relation to the face and cranium..  Assessment and Recommendations 1. 34 week intrauterine gestation 2. 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 Gestational diabetes 4. Previous cesarean delivery  Recommend transfer to [facility] to [facility] MFM service for; 1. Fetal MRI 2. Fetal BPP/NST 3. Possible perinatal corticosteroids 4. Newborn Medicine and Pediatric Neurosurgery consults 5. Possible amnioreduction for symptoms 6. Planning for delivery and newborn management  I spent 30 minutes in face to face consultation with the patient. Thank you for the opportunity to work with Thermon Leyland and her husband.    Mariama Saintvil,JOE

## 2011-04-12 NOTE — Plan of Care (Signed)
Pt to be transferred to [facility] to Dr Gavin Potters for continued care, pt verbalized understanding and signed consent for transfer and release of medical records. Called [phone] and talked with Barkley Bruns, RN-charge nurse, report given and will call care link for pt to be transferred.

## 2011-04-12 NOTE — Treatment Plan (Signed)
Dr. Tamela Oddi at bedside discussing findings of ultrasound on 03/12/11, will repeat ultrasound today per MFM then make plan of care, pt and fob questions answered and verbalized understanding.

## 2011-04-12 NOTE — Plan of Care (Signed)
Pt transferred to [facility] via [facility] by stretcher with FOB at pt's side, pt vitals stable and no c/o pain or discomfort.

## 2011-04-12 NOTE — Progress Notes (Signed)
  S: Preterm labor symptoms: None.  O: Blood pressure 126/90, pulse 102, temperature 98.3 F (36.8 C), temperature source Oral, resp. rate 20, height 5' 2.5" (1.588 m), weight 113.309 kg (249 lb 12.8 oz), SpO2 100.00%.   OZH:YQMVHQIO: 140 bpm, Variability: Good {> 6 bpm) and Accelerations: Reactive Toco: None SVE: Deferred  A/P- 30 y.o. admitted with fetal tumor; polyhydramios  No evidence of maternal respiratory compromise Dating:  [redacted]w[redacted]d  MFM consult today

## 2011-04-14 DIAGNOSIS — B009 Herpesviral infection, unspecified: Secondary | ICD-10-CM | POA: Insufficient documentation

## 2011-04-14 DIAGNOSIS — D649 Anemia, unspecified: Secondary | ICD-10-CM | POA: Insufficient documentation

## 2011-04-19 DIAGNOSIS — N719 Inflammatory disease of uterus, unspecified: Secondary | ICD-10-CM | POA: Insufficient documentation

## 2011-04-19 NOTE — Progress Notes (Signed)
UR chart review completed.  

## 2011-04-22 DIAGNOSIS — B952 Enterococcus as the cause of diseases classified elsewhere: Secondary | ICD-10-CM | POA: Insufficient documentation

## 2011-04-22 DIAGNOSIS — R7881 Bacteremia: Secondary | ICD-10-CM | POA: Insufficient documentation

## 2011-07-12 ENCOUNTER — Other Ambulatory Visit: Payer: Self-pay | Admitting: Obstetrics & Gynecology

## 2011-07-12 DIAGNOSIS — R1900 Intra-abdominal and pelvic swelling, mass and lump, unspecified site: Secondary | ICD-10-CM

## 2011-07-14 ENCOUNTER — Ambulatory Visit (HOSPITAL_COMMUNITY): Payer: BC Managed Care – PPO | Attending: Obstetrics & Gynecology

## 2011-08-24 ENCOUNTER — Encounter (HOSPITAL_COMMUNITY): Payer: Self-pay | Admitting: *Deleted

## 2012-08-04 ENCOUNTER — Emergency Department: Payer: Self-pay | Admitting: Emergency Medicine

## 2012-08-04 LAB — COMPREHENSIVE METABOLIC PANEL
Anion Gap: 7 (ref 7–16)
BUN: 17 mg/dL (ref 7–18)
Calcium, Total: 8.7 mg/dL (ref 8.5–10.1)
EGFR (African American): >60
EGFR (Non-African Amer.): >60
Glucose: 188 mg/dL — ABNORMAL HIGH (ref 65–99)
Osmolality: 278 (ref 275–301)
Potassium: 4.5 mmol/L (ref 3.5–5.1)
SGOT(AST): 18 U/L (ref 15–37)

## 2012-08-04 LAB — URINALYSIS, COMPLETE
Bilirubin,UR: NEGATIVE
Blood: NEGATIVE
Glucose,UR: 50 mg/dL (ref 0–75)
Ketone: NEGATIVE
Nitrite: NEGATIVE
Ph: 6 (ref 4.5–8.0)
Protein: 30
Specific Gravity: 1.012 (ref 1.003–1.030)
Squamous Epithelial: 1
WBC UR: 1 /HPF (ref 0–5)

## 2012-08-04 LAB — CBC
HCT: 42 % (ref 35.0–47.0)
MCH: 29.6 pg (ref 26.0–34.0)
MCHC: 33.6 g/dL (ref 32.0–36.0)
MCV: 88 fL (ref 80–100)
Platelet: 187 10*3/uL (ref 150–440)
RDW: 13.3 % (ref 11.5–14.5)
WBC: 13.6 10*3/uL — ABNORMAL HIGH (ref 3.6–11.0)

## 2012-08-04 LAB — PRO B NATRIURETIC PEPTIDE: B-Type Natriuretic Peptide: 14 pg/mL (ref 0–125)

## 2012-08-04 LAB — LIPASE, BLOOD: Lipase: 130 U/L (ref 73–393)

## 2012-08-04 LAB — TROPONIN I: Troponin-I: <0.02 ng/mL

## 2013-06-13 ENCOUNTER — Other Ambulatory Visit (HOSPITAL_COMMUNITY): Payer: Self-pay | Admitting: Obstetrics & Gynecology

## 2013-06-13 DIAGNOSIS — N809 Endometriosis, unspecified: Secondary | ICD-10-CM

## 2013-06-13 DIAGNOSIS — C499 Malignant neoplasm of connective and soft tissue, unspecified: Secondary | ICD-10-CM

## 2013-06-13 DIAGNOSIS — N719 Inflammatory disease of uterus, unspecified: Secondary | ICD-10-CM

## 2014-02-22 ENCOUNTER — Ambulatory Visit (INDEPENDENT_AMBULATORY_CARE_PROVIDER_SITE_OTHER): Payer: BC Managed Care – PPO | Admitting: Family Medicine

## 2014-02-22 VITALS — BP 118/86 | HR 89 | Temp 98.7°F | Resp 15 | Ht 62.5 in | Wt 199.0 lb

## 2014-02-22 DIAGNOSIS — J029 Acute pharyngitis, unspecified: Secondary | ICD-10-CM

## 2014-02-22 DIAGNOSIS — J3489 Other specified disorders of nose and nasal sinuses: Secondary | ICD-10-CM

## 2014-02-22 DIAGNOSIS — R0981 Nasal congestion: Secondary | ICD-10-CM

## 2014-02-22 LAB — POCT RAPID STREP A (OFFICE): Rapid Strep A Screen: NEGATIVE

## 2014-02-22 MED ORDER — AMOXICILLIN 875 MG PO TABS: 875.0000 mg | ORAL_TABLET | Freq: Two times a day (BID) | ORAL | Status: DC

## 2014-02-22 NOTE — Progress Notes (Signed)
Subjective:    Patient ID: Lynn Vargas, female    DOB: 1980-10-04, 33 y.o.   MRN: [id number] This chart was scribed for Robyn Haber, MD by Steva Colder, ED Scribe. The patient was seen in room 8 at 10:42 AM.   Chief Complaint  Patient presents with  . Sore Throat    since yesterday  . Epistaxis    sneezing    HPI HPI Comments: Lynn Vargas is a 33 y.o. female who presents today complaining of sore throat and epistaxis onset yesterday. She states that yesterday morning the back of her throat was hurting. She states that she is having associated symptoms of rhinorrhea and congestion. She states that she has tried Zyrtec with no relief for her symptoms. She denies any other associated symptoms. 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 She states that she works at [employer] full time and receives her benefits through them. She states that she is allergic to Latex.  Patient Active Problem List   Diagnosis Date Noted  . Polyhydramnios, maternal, antepartum 04/12/2011   Past Medical History  Diagnosis Date  . Gestational diabetes     diet controlled  . Shortness of breath   . UTI (lower urinary tract infection)   . 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  . Cyst of perianal area     removed   Past Surgical History  Procedure Laterality Date  . Cesarean section     Allergies  Allergen Reactions  . Latex     rash   Prior to Admission medications   Not on File     Review of Systems  HENT: Positive for congestion, rhinorrhea, sneezing and sore throat.        Objective:   Physical Exam  Nursing note and vitals reviewed. Constitutional: She is oriented to person, place, and time. She appears well-developed and well-nourished. No distress.  HENT:  Head: Normocephalic and atraumatic.  Right Ear: Tympanic membrane, external ear and ear canal normal.  Left Ear: Tympanic membrane, external ear and ear canal normal.    Mouth/Throat: Posterior oropharyngeal erythema present.  Eyes: EOM are normal.  Neck: Neck supple. No tracheal deviation present.  Cardiovascular: Normal rate, regular rhythm and normal heart sounds.   Pulmonary/Chest: Effort normal and breath sounds normal. No respiratory distress.  Musculoskeletal: Normal range of motion.  Lymphadenopathy:    She has no cervical adenopathy.  Neurological: She is alert and oriented to person, place, and time.  Skin: Skin is warm and dry.  Psychiatric: She has a normal mood and affect. Her behavior is normal.         BP 118/86  Pulse 89  Temp(Src) 98.7 F (37.1 C) (Oral)  Resp 15  Ht 5' 2.5" (1.588 m)  Wt 199 lb (90.266 kg)  BMI 35.80 kg/m2  SpO2 100%  LMP 02/21/2014  Assessment & Plan:  DIAGNOSTIC STUDIES: Oxygen Saturation is 100% on room air, normal by my interpretation.    COORDINATION OF CARE: 10:45 AM-Discussed treatment plan which includes rapid strep and Amoxicillin with pt at bedside and pt agreed to plan.   1. Sore throat   2. Sinus congestion      I personally performed the services described in this documentation, which was scribed in my presence. The recorded information has been reviewed and is accurate.  Sore throat - Plan: POCT rapid strep A, Throat culture ([facility]), amoxicillin (AMOXIL) 875 MG  tablet  Sinus congestion - Plan: amoxicillin (AMOXIL) 875 MG tablet  Signed, Robyn Haber, MD

## 2014-02-24 LAB — CULTURE, GROUP A STREP: Organism ID, Bacteria: NORMAL

## 2015-07-07 ENCOUNTER — Encounter: Payer: Self-pay | Admitting: Medical Oncology

## 2015-07-07 ENCOUNTER — Emergency Department
Admission: EM | Admit: 2015-07-07 | Discharge: 2015-07-07 | Disposition: A | Discharge status: Home / Self Care | Payer: No Typology Code available for payment source | Attending: Emergency Medicine | Admitting: Emergency Medicine

## 2015-07-07 DIAGNOSIS — Y92410 Unspecified street and highway as the place of occurrence of the external cause: Secondary | ICD-10-CM | POA: Diagnosis not present

## 2015-07-07 DIAGNOSIS — S161XXA Strain of muscle, fascia and tendon at neck level, initial encounter: Secondary | ICD-10-CM | POA: Diagnosis not present

## 2015-07-07 DIAGNOSIS — Z91040 Latex allergy status: Secondary | ICD-10-CM | POA: Insufficient documentation

## 2015-07-07 DIAGNOSIS — S29002A Unspecified injury of muscle and tendon of back wall of thorax, initial encounter: Secondary | ICD-10-CM | POA: Diagnosis not present

## 2015-07-07 DIAGNOSIS — Y9389 Activity, other specified: Secondary | ICD-10-CM | POA: Insufficient documentation

## 2015-07-07 DIAGNOSIS — Z792 Long term (current) use of antibiotics: Secondary | ICD-10-CM | POA: Insufficient documentation

## 2015-07-07 DIAGNOSIS — Y998 Other external cause status: Secondary | ICD-10-CM | POA: Insufficient documentation

## 2015-07-07 DIAGNOSIS — S199XXA Unspecified injury of neck, initial encounter: Secondary | ICD-10-CM | POA: Diagnosis present

## 2015-07-07 MED ORDER — IBUPROFEN 800 MG PO TABS: 800.0000 mg | ORAL_TABLET | Freq: Three times a day (TID) | ORAL | Status: DC

## 2015-07-07 MED ORDER — IBUPROFEN 800 MG PO TABS
800.0000 mg | ORAL_TABLET | Freq: Once | ORAL | Status: AC
  Administered 2015-07-07: 800 mg via ORAL
  Filled 2015-07-07: qty 1

## 2015-07-07 MED ORDER — CYCLOBENZAPRINE HCL 5 MG PO TABS: ORAL_TABLET | ORAL | Status: DC
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
## 2015-07-07 NOTE — Discharge Instructions (Signed)
Cervical Sprain A cervical sprain is when the tissues (ligaments) that hold the neck bones in place stretch or tear. HOME CARE   Put ice on the injured area.  Put ice in a plastic bag.  Place a towel between your skin and the bag.  Leave the ice on for 15-20 minutes, 3-4 times a day.  You may have been given a collar to wear. This collar keeps your neck from moving while you heal.  Do not take the collar off unless told by your doctor.  If you have long hair, keep it outside of the collar.  Ask your doctor before changing the position of your collar. You may need to change its position over time to make it more comfortable.  If you are allowed to take off the collar for cleaning or bathing, follow your doctor's instructions on how to do it safely.  Keep your collar clean by wiping it with mild soap and water. Dry it completely. If the collar has removable pads, remove them every 1-2 days to hand wash them with soap and water. Allow them to air dry. They should be dry before you wear them in the collar.  Do not drive while wearing the collar.  Only take medicine as told by your doctor.  Keep all doctor visits as told.  Keep all physical therapy visits as told.  Adjust your work station so that you have good posture while you work.  Avoid positions and activities that make your problems worse.  Warm up and stretch before being active. GET HELP IF:  Your pain is not controlled with medicine.  You cannot take less pain medicine over time as planned.  Your activity level does not improve as expected. GET HELP RIGHT AWAY IF:   You are bleeding.  Your stomach is upset.  You have an allergic reaction to your medicine.  You develop new problems that you cannot explain.  You lose feeling (become numb) or you cannot move any part of your body (paralysis).  You have tingling or weakness in any part of your body.  Your symptoms get worse. Symptoms include:  Pain,  soreness, stiffness, puffiness (swelling), or a burning feeling in your neck.  Pain when your neck is touched.  Shoulder or upper back pain.  Limited ability to move your neck.  Headache.  Dizziness.  Your hands or arms feel week, lose feeling, or tingle.  Muscle spasms.  Difficulty swallowing or chewing. MAKE SURE YOU:   Understand these instructions.  Will watch your condition.  Will get help right away if you are not doing well or get worse.   This information is not intended to replace advice given to you by your health care provider. Make sure you discuss any questions you have with your health care provider.   Document Released: 12/13/2007 Document Revised: 02/26/2013 Document Reviewed: 01/01/2013 Elsevier Interactive Patient Education 2016 [place] or ice to muscles as needed for soreness. Continue taking ibuprofen every 8 hours for soreness and inflammation. Flexeril at bedtime only if needed for muscle spasms. Follow-up with Dr. Rance Muir office if any continued problems.

## 2015-11-27 ENCOUNTER — Emergency Department
Admission: EM | Admit: 2015-11-27 | Discharge: 2015-11-27 | Disposition: A | Discharge status: Home / Self Care | Payer: BLUE CROSS/BLUE SHIELD | Attending: Emergency Medicine | Admitting: Emergency Medicine

## 2015-11-27 ENCOUNTER — Encounter: Payer: Self-pay | Admitting: Emergency Medicine

## 2015-11-27 DIAGNOSIS — Z79899 Other long term (current) drug therapy: Secondary | ICD-10-CM | POA: Diagnosis not present

## 2015-11-27 DIAGNOSIS — H6092 Unspecified otitis externa, left ear: Secondary | ICD-10-CM | POA: Insufficient documentation

## 2015-11-27 DIAGNOSIS — Z792 Long term (current) use of antibiotics: Secondary | ICD-10-CM | POA: Insufficient documentation

## 2015-11-27 DIAGNOSIS — Z791 Long term (current) use of non-steroidal anti-inflammatories (NSAID): Secondary | ICD-10-CM | POA: Diagnosis not present

## 2015-11-27 DIAGNOSIS — Z91040 Latex allergy status: Secondary | ICD-10-CM | POA: Diagnosis not present

## 2015-11-27 DIAGNOSIS — H9202 Otalgia, left ear: Secondary | ICD-10-CM | POA: Diagnosis present

## 2015-11-27 MED ORDER — HYDROCODONE-ACETAMINOPHEN 5-325 MG PO TABS: 1.0000 | ORAL_TABLET | ORAL | Status: DC | PRN

## 2015-11-27 MED ORDER — OFLOXACIN 0.3 % OP SOLN: 4.0000 [drp] | Freq: Two times a day (BID) | OPHTHALMIC | Status: DC

## 2015-11-27 NOTE — ED Notes (Signed)
Pt was seen at urgent care on Monday and given anitbitoic for left ear infection. Pt c/o no pain relief and green drainage coming from ear. Pt denies fevers

## 2015-11-27 NOTE — Discharge Instructions (Signed)
Otitis Externa  Otitis externa is a bacterial or fungal infection of the outer ear canal. This is the area from the eardrum to the outside of the ear. Otitis externa is sometimes called "swimmer's ear."  CAUSES   Possible causes of infection include:  · Swimming in dirty water.  · Moisture remaining in the ear after swimming or bathing.  · Mild injury (trauma) to the ear.  · Objects stuck in the ear (foreign body).  · Cuts or scrapes (abrasions) on the outside of the ear.  SIGNS AND SYMPTOMS   The first symptom of infection is often itching in the ear canal. Later signs and symptoms may include swelling and redness of the ear canal, ear pain, and yellowish-white fluid (pus) coming from the ear. The ear pain may be worse when pulling on the earlobe.  DIAGNOSIS   Your health care provider will perform a physical exam. A sample of fluid may be taken from the ear and examined for bacteria or fungi.  TREATMENT   Antibiotic ear drops are often given for 10 to 14 days. Treatment may also include pain medicine or corticosteroids to reduce itching and swelling.  HOME CARE INSTRUCTIONS   · Apply antibiotic ear drops to the ear canal as prescribed by your health care provider.  · Take medicines only as directed by your health care provider.  · If you have diabetes, follow any additional treatment instructions from your health care provider.  · Keep all follow-up visits as directed by your health care provider.  PREVENTION   · Keep your ear dry. Use the corner of a towel to absorb water out of the ear canal after swimming or bathing.  · Avoid scratching or putting objects inside your ear. This can damage the ear canal or remove the protective wax that lines the canal. This makes it easier for bacteria and fungi to grow.  · Avoid swimming in lakes, polluted water, or poorly chlorinated pools.  · You may use ear drops made of rubbing alcohol and vinegar after swimming. Combine equal parts of white vinegar and alcohol in a bottle.  Put 3 or 4 drops into each ear after swimming.  SEEK MEDICAL CARE IF:   · You have a fever.  · Your ear is still red, swollen, painful, or draining pus after 3 days.  · Your redness, swelling, or pain gets worse.  · You have a severe headache.  · You have redness, swelling, pain, or tenderness in the area behind your ear.  MAKE SURE YOU:   · Understand these instructions.  · Will watch your condition.  · Will get help right away if you are not doing well or get worse.     This information is not intended to replace advice given to you by your health care provider. Make sure you discuss any questions you have with your health care provider.     Document Released: 06/26/2005 Document Revised: 07/17/2014 Document Reviewed: 07/13/2011  Elsevier Interactive Patient Education ©2016 Elsevier Inc[employer]    Ear Drops, Adult  You have been diagnosed with a condition requiring you to put drops of medicine into your outer ear.  HOME CARE INSTRUCTIONS   · Put drops in the affected ear as instructed. After putting the drops in, you will need to lie down with the affected ear facing up for ten minutes so the drops will remain in the ear canal and run down and fill the canal. Continue using the ear drops for   as long as directed by your health care provider.  · Prior to getting up, put a cotton ball gently in your ear canal. Leave enough of the cotton ball out so it can be easily removed. Do not attempt to push this down into the canal with a cotton-tipped swab or other instrument.  · Do not irrigate or wash out your ears if you have had a perforated eardrum or mastoid surgery, or unless instructed to do so by your health care provider.  · Keep appointments with your health care provider as instructed.  · Finish all medicine, or use for the length of time prescribed by your health care provider. Continue the drops even if your problem seems to be doing well after a couple days, or continue as instructed.  SEEK MEDICAL CARE IF:  · You  become worse or develop increasing pain.  · You notice any unusual drainage from your ear (particularly if the drainage has a bad smell).  · You develop hearing difficulties.  · You experience a serious form of dizziness in which you feel as if the room is spinning, and you feel nauseated (vertigo).  · The outside of your ear becomes red or swollen or both. This may be a sign of an allergic reaction.  MAKE SURE YOU:   · Understand these instructions.  · Will watch your condition.  · Will get help right away if you are not doing well or get worse.     This information is not intended to replace advice given to you by your health care provider. Make sure you discuss any questions you have with your health care provider.     Document Released: 06/20/2001 Document Revised: 07/17/2014 Document Reviewed: 01/21/2013  Elsevier Interactive Patient Education ©2016 Elsevier Inc.

## 2015-11-27 NOTE — ED Notes (Signed)
See triage note.

## 2015-11-27 NOTE — ED Provider Notes (Signed)
[facility] Emergency Department Provider Note  ____________________________________________  Time seen: Approximately 2:45 PM  I have reviewed the triage vital signs and the nursing notes.   HISTORY  Chief Complaint Otalgia    HPI Lynn Vargas is a 35 y.o. female presents for evaluation of left ear infection. Patient states that she was seen in the urgent care 5 days ago and given antibiotics and has now noticed green discharge coming from her ear.   Past Medical History  Diagnosis Date  . Gestational diabetes     diet controlled  . Shortness of breath   . UTI (lower urinary tract infection)   . 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  . Cyst of perianal area     removed    Patient Active Problem List   Diagnosis Date Noted  . Polyhydramnios, maternal, antepartum 04/12/2011    Past Surgical History  Procedure Laterality Date  . Cesarean section      Current Outpatient Rx  Name  Route  Sig  Dispense  Refill  . amoxicillin (AMOXIL) 875 MG tablet   Oral   Take 1 tablet (875 mg total) by mouth 2 (two) times daily.   20 tablet   0   . cyclobenzaprine (FLEXERIL) 5 MG tablet      Take one tablet at bedtime prn muscle spasms   5 tablet   1   . HYDROcodone-acetaminophen (NORCO) 5-325 MG tablet   Oral   Take 1-2 tablets by mouth every 4 (four) hours as needed for moderate pain.   15 tablet   0   . ibuprofen (ADVIL,MOTRIN) 800 MG tablet   Oral   Take 1 tablet (800 mg total) by mouth 3 (three) times daily.   30 tablet   0   . ofloxacin (OCUFLOX) 0.3 % ophthalmic solution   Left Ear   Place 4 drops into the left ear 2 (two) times daily.   5 mL   0     Allergies Latex  Family History  Problem Relation Age of Onset  . Asthma Mother   . Diabetes Son     Social History Social History  Substance Use Topics  . Smoking status: Never Smoker   . Smokeless tobacco: Never Used  . Alcohol Use: No     Review of Systems Constitutional: No fever/chills Eyes: No visual changes. ENT: Positive for left ear infection with green discharge/drainage coming from the ear. Cardiovascular: Denies chest pain. Respiratory: Denies shortness of breath. Gastrointestinal: No abdominal pain.  No nausea, no vomiting.  No diarrhea.  No constipation. Genitourinary: Negative for dysuria. Musculoskeletal: Negative for back pain. Skin: Negative for rash. Neurological: Negative for headaches, focal weakness or numbness.  10-point ROS otherwise negative.  ____________________________________________   PHYSICAL EXAM: BP 109/67 mmHg  Pulse 81  Temp(Src) 99 F (37.2 C) (Oral)  Resp 18  SpO2 100%  LMP 11/08/2015  VITAL SIGNS: ED Triage Vitals  Enc Vitals Group     BP --      Pulse --      Resp --      Temp --      Temp src --      SpO2 --      Weight --      Height --      Head Cir --      Peak Flow --      Pain Score 11/27/15 1444 9  Pain Loc --      Pain Edu? --      Excl. in [place]? --     Constitutional: Alert and oriented. Well appearing and in no acute distress. Head: Atraumatic.Left ear with greenish drainage noted from the ear canal itself. Nose: No congestion/rhinnorhea. Mouth/Throat: Mucous membranes are moist.  Oropharynx non-erythematous. Neck: No stridor. Full range of motion nontender.  Skin:  Skin is warm, dry and intact. No rash noted. Psychiatric: Mood and affect are normal. Speech and behavior are normal.  ____________________________________________   LABS (all labs ordered are listed, but only abnormal results are displayed)  Labs Reviewed - No data to display ____________________________________________    PROCEDURES  Procedure(s) performed: None  Critical Care performed: No  ____________________________________________   INITIAL IMPRESSION / ASSESSMENT AND PLAN / ED COURSE  Pertinent labs & imaging results that were available during my care of  the patient were reviewed by me and considered in my medical decision making (see chart for details).  Acute otitis externa. Rx given for Ocuflox drops. Patient to follow up with PCP or return to the ER as needed. Referral given to ENT if needed. ____________________________________________   FINAL CLINICAL IMPRESSION(S) / ED DIAGNOSES  Final diagnoses:  Otitis externa, left     This chart was dictated using voice recognition software/Dragon. Despite best efforts to proofread, errors can occur which can change the meaning. Any change was purely unintentional.   Arlyss Repress, PA-C 11/27/15 [place], MD 11/27/15 [phone]

## 2016-06-15 DIAGNOSIS — F419 Anxiety disorder, unspecified: Secondary | ICD-10-CM | POA: Insufficient documentation

## 2016-06-15 DIAGNOSIS — F329 Major depressive disorder, single episode, unspecified: Secondary | ICD-10-CM

## 2016-06-15 DIAGNOSIS — F32A Depression, unspecified: Secondary | ICD-10-CM | POA: Insufficient documentation

## 2016-07-17 ENCOUNTER — Telehealth: Payer: Self-pay

## 2016-07-17 ENCOUNTER — Ambulatory Visit: Payer: Self-pay | Admitting: Unknown Physician Specialty

## 2016-07-17 NOTE — Telephone Encounter (Signed)
Called and spoke to patient. Patient had appointment today, rescheduled for tomorrow, and provider is still going to be out tomorrow. Offered appointment with Apolonio Schneiders tomorrow but patient stated she would just call back and re schedule at another time. Apologized to patient for any issues this may cause her to have.

## 2016-07-18 ENCOUNTER — Ambulatory Visit: Payer: Self-pay | Admitting: Unknown Physician Specialty

## 2016-07-18 ENCOUNTER — Ambulatory Visit: Payer: Self-pay | Admitting: Family Medicine

## 2016-08-07 ENCOUNTER — Other Ambulatory Visit: Payer: Self-pay

## 2016-08-07 ENCOUNTER — Ambulatory Visit (INDEPENDENT_AMBULATORY_CARE_PROVIDER_SITE_OTHER): Payer: BLUE CROSS/BLUE SHIELD | Admitting: Family Medicine

## 2016-08-07 ENCOUNTER — Encounter: Payer: Self-pay | Admitting: Family Medicine

## 2016-08-07 VITALS — BP 116/78 | HR 90 | Temp 98.6°F | Ht 63.25 in | Wt 211.0 lb

## 2016-08-07 DIAGNOSIS — R635 Abnormal weight gain: Secondary | ICD-10-CM | POA: Diagnosis not present

## 2016-08-07 DIAGNOSIS — K5900 Constipation, unspecified: Secondary | ICD-10-CM | POA: Diagnosis not present

## 2016-08-07 DIAGNOSIS — N898 Other specified noninflammatory disorders of vagina: Secondary | ICD-10-CM | POA: Diagnosis not present

## 2016-08-07 DIAGNOSIS — Z7689 Persons encountering health services in other specified circumstances: Secondary | ICD-10-CM

## 2016-08-07 NOTE — Progress Notes (Signed)
BP 116/78   Pulse 90   Temp 98.6 F (37 C)   Ht 5' 3.25" (1.607 m)   Wt 211 lb (95.7 kg)   LMP 07/07/2016 (Exact Date)   SpO2 99%   BMI 37.08 kg/m    Subjective:    Patient ID: Hettie Holstein, female    DOB: [birth year], 36 y.o.   MRN: [id number]  HPI: MADDYX BEAMES is a 36 y.o. female  Chief Complaint  Patient presents with  . Establish Care    Would like STD check today. Needs to be scheduled for a CPE.   Patient presents today to re-establish care. States overall she is doing well. Not currently taking any long term medications, only taking a short term course of macrobid for possible UTI and a diflucan tablet for possible yeast infection that she received at UC last week. Still having some vaginal irritation and is with a new partner so would like a full STD panel today. Has known HSV-2, no recent outbreaks. Notes that her period has been abnormal the past 2 months, coming twice in December and not yet this month so would like to have a pregnancy test as well.  Does have anxiety and HTN but both are well controlled without medication. Also has iron deficiency anemia for which she was taking a natural iron supplement but was becoming constipated so d/c'ed the supplement.   Also notes that she has been working with a Physiological scientist x 1 year and not seeing any weight loss results. Has not worked with a Automotive engineer, and feels like she is lost as to how to balance her meals during the day. Very motivated to lose weight and seeking help to do so.   Relevant past medical, surgical, family and social history reviewed and updated as indicated. Interim medical history since our last visit reviewed. Allergies and medications reviewed and updated.  Review of Systems  Constitutional: Negative.   HENT: Negative.   Eyes: Negative.   Respiratory: Negative.   Cardiovascular: Negative.   Gastrointestinal: Positive for constipation.  Genitourinary: Positive for menstrual problem. Vaginal  pain: irritation.  Musculoskeletal: Negative.   Psychiatric/Behavioral: Negative.     Per HPI unless specifically indicated above     Objective:    BP 116/78   Pulse 90   Temp 98.6 F (37 C)   Ht 5' 3.25" (1.607 m)   Wt 211 lb (95.7 kg)   LMP 07/07/2016 (Exact Date)   SpO2 99%   BMI 37.08 kg/m   Wt Readings from Last 3 Encounters:  08/07/16 211 lb (95.7 kg)  07/07/15 199 lb (90.3 kg)  02/22/14 199 lb (90.3 kg)    Physical Exam  Constitutional: She is oriented to person, place, and time. She appears well-developed and well-nourished. No distress.  HENT:  Head: Atraumatic.  Eyes: Conjunctivae are normal. Pupils are equal, round, and reactive to light. No scleral icterus.  Neck: Normal range of motion. Neck supple.  Cardiovascular: Normal rate and normal heart sounds.   Pulmonary/Chest: Effort normal and breath sounds normal. No respiratory distress.  Abdominal: Soft. Bowel sounds are normal. She exhibits no distension. There is no tenderness.  Musculoskeletal: Normal range of motion.  Neurological: She is alert and oriented to person, place, and time.  Skin: Skin is warm and dry.  Psychiatric: She has a normal mood and affect. Her behavior is normal.  Nursing note and vitals reviewed.     Assessment & Plan:   Problem List Items Addressed  This Visit    None    Visit Diagnoses    Encounter to establish care    -  Primary   Overall doing well. Will have her come back for a Physical in the near future.    Vaginal irritation       Await repeat U/A, as well as STD labs. Will have her come back for wet prep if final diflucan tablet does not resolve itching.    Relevant Orders   UA/M w/rflx Culture, Routine   RPR   GC/Chlamydia Probe Amp   Pregnancy, urine   HIV antibody   Weight gain       Referral to nutrician education placed. Continue working with Physiological scientist and making healthy diet choices.    Relevant Orders   Amb Referral to Nutrition and Diabetic E    Constipation, unspecified constipation type       Discussed to restart iron supplement and add miralax and probiotic to help counteract. Drink plenty of water, high fiber diet.        Follow up plan: Return for Physical Exam.

## 2016-08-07 NOTE — Patient Instructions (Signed)
Follow up for a Physical Exam

## 2016-08-08 LAB — HIV ANTIBODY (ROUTINE TESTING W REFLEX): HIV SCREEN 4TH GENERATION: NONREACTIVE

## 2016-08-08 LAB — MICROSCOPIC EXAMINATION
EPITHELIAL CELLS (NON RENAL): NONE SEEN /HPF (ref 0–10)
WBC UA: NONE SEEN /HPF (ref 0–?)

## 2016-08-08 LAB — UA/M W/RFLX CULTURE, ROUTINE
Bilirubin, UA: NEGATIVE
Glucose, UA: NEGATIVE
KETONES UA: NEGATIVE
LEUKOCYTES UA: NEGATIVE
Nitrite, UA: NEGATIVE
PH UA: 7 (ref 5.0–7.5)
Protein, UA: NEGATIVE
SPEC GRAV UA: 1.015 (ref 1.005–1.030)
Urobilinogen, Ur: 0.2 mg/dL (ref 0.2–1.0)

## 2016-08-08 LAB — PREGNANCY, URINE: Preg Test, Ur: NEGATIVE

## 2016-08-08 LAB — RPR: RPR Ser Ql: NONREACTIVE

## 2016-08-09 LAB — GC/CHLAMYDIA PROBE AMP
Chlamydia trachomatis, NAA: NEGATIVE
Neisseria gonorrhoeae by PCR: NEGATIVE

## 2016-08-21 ENCOUNTER — Encounter: Payer: BLUE CROSS/BLUE SHIELD | Admitting: Family Medicine

## 2016-08-28 ENCOUNTER — Encounter: Payer: Self-pay | Admitting: Family Medicine

## 2016-08-28 ENCOUNTER — Encounter: Payer: No Typology Code available for payment source | Attending: Family Medicine | Admitting: Dietician

## 2016-08-28 ENCOUNTER — Ambulatory Visit (INDEPENDENT_AMBULATORY_CARE_PROVIDER_SITE_OTHER): Payer: BLUE CROSS/BLUE SHIELD | Admitting: Family Medicine

## 2016-08-28 ENCOUNTER — Encounter: Payer: Self-pay | Admitting: Dietician

## 2016-08-28 VITALS — Ht 63.5 in | Wt 209.5 lb

## 2016-08-28 VITALS — BP 108/70 | HR 87 | Temp 99.0°F | Resp 17 | Ht 63.0 in | Wt 213.0 lb

## 2016-08-28 DIAGNOSIS — R635 Abnormal weight gain: Secondary | ICD-10-CM

## 2016-08-28 DIAGNOSIS — F330 Major depressive disorder, recurrent, mild: Secondary | ICD-10-CM | POA: Diagnosis not present

## 2016-08-28 DIAGNOSIS — Z0000 Encounter for general adult medical examination without abnormal findings: Secondary | ICD-10-CM

## 2016-08-28 DIAGNOSIS — Z6836 Body mass index (BMI) 36.0-36.9, adult: Secondary | ICD-10-CM | POA: Diagnosis not present

## 2016-08-28 DIAGNOSIS — E669 Obesity, unspecified: Secondary | ICD-10-CM | POA: Diagnosis not present

## 2016-08-28 DIAGNOSIS — F419 Anxiety disorder, unspecified: Secondary | ICD-10-CM

## 2016-08-28 LAB — UA/M W/RFLX CULTURE, ROUTINE
BILIRUBIN UA: NEGATIVE
GLUCOSE, UA: NEGATIVE
KETONES UA: NEGATIVE
Leukocytes, UA: NEGATIVE
Nitrite, UA: NEGATIVE
PROTEIN UA: NEGATIVE
RBC UA: NEGATIVE
SPEC GRAV UA: 1.015 (ref 1.005–1.030)
UUROB: 0.2 mg/dL (ref 0.2–1.0)
pH, UA: 7 (ref 5.0–7.5)

## 2016-08-28 MED ORDER — BUPROPION HCL ER (XL) 150 MG PO TB24: 150.0000 mg | ORAL_TABLET | Freq: Every day | ORAL | 0 refills | Status: DC

## 2016-08-28 NOTE — Patient Instructions (Signed)
   Continue to eat every 3-5 hours during the day.   Include lean protein source (7-30g), healthy carbohydrate, and plenty of veggies and/or fruit.   Discuss other possible causes of weight gain with doctor: stress/ stress hormones, testosterone level, basic metabolic rate, bacteria levels in your digestive tract.

## 2016-08-28 NOTE — Progress Notes (Signed)
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 Auditory . Visual . 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 She uses MyFitnessPal sporadically to track food intake, and averages under 1500kcal daily. She reports working to eliminate debt, and so cooks at home mostly. She is particularly concerned with abdominal fat, and states she has not been able to lose from her abdomen until she became ill with GI virus recently. She feels that some other issue is likely affecting her weight.    Physical activity: personal trainer 1x a week for 1 hour; on the job walking and lifting packages 40hrs weekly.  Dietary Intake:  Usual eating pattern includes 3 meals and 1-2 snacks per day. Dining out frequency: 1-2 meals per week.  Breakfast: orange juice cleanse drink or other protein drink, was eating oatmeal but getting full before finishing serving.  Snack: nuts with cranberries, small pack Lunch: lean cuisine meal Snack: none or same as am Supper: salmon, brown or wild rice, vegetables Snack: none Beverages: water, sometimes with Crystal light, coffee in am with splenda  Nutrition Care Education: Topics covered: weight management Basic nutrition: appropriate nutrient balance, appropriate meal and snack schedule Weight control: Estimated caloric needs at 1400-1500kcal daily for weight loss. Discussed factors that  can influence weight such as hormones, including stress-related hormones, thyroid function, gut bacteria, basal metabolic rate. Reviewed importance of eating at regular intervals and ensuring adequate nutrient intake from protein, CHO, and fat. Discussed role of physical activity in weight control.    Nutritional Diagnosis:  [place]-3.3 Overweight/obesity As related to history of excess calories, stress.  As evidenced by patient report.  Intervention: Instruction as noted above.   Measured patient's metabolic rate and body composition using InBody scale; BMR estimated to be 1509kcal.    Commended patient for efforts made thus far, and encouraged her to continue with healthy habits.    Encouraged patient to further discuss other potential weight factors at next MD visit.   No follow-up scheduled at this time; patient to call and schedule later if needed, or call with any questions.  Education Materials given:  Marland Kitchen InBody analysis report . Goals/ instructions  Learner/ who was taught:  . Patient   Level of understanding: Marland Kitchen Verbalizes/ demonstrates competency  Demonstrated degree of understanding via:   Teach back Learning barriers: . None  Willingness to learn/ readiness for change: . Eager, change in progress  Monitoring and Evaluation:  Dietary intake, exercise, and body weight      follow up: prn

## 2016-08-28 NOTE — Progress Notes (Signed)
BP 108/70 (BP Location: Left Arm, Patient Position: Sitting, Cuff Size: Large)   Pulse 87   Temp 99 F (37.2 C) (Oral)   Resp 17   Ht 5\' 3"  (1.6 m)   Wt 213 lb (96.6 kg)   LMP 08/10/2016 (Approximate)   SpO2 99%   BMI 37.73 kg/m    Subjective:    Patient ID: Lynn Vargas, female    DOB: [birth year], 36 y.o.   MRN: [id number]  HPI: Lynn Vargas is a 36 y.o. female presenting on 08/28/2016 for comprehensive medical examination. Current medical complaints include:weight gain. Had dietician appointment this morning as recommended at previous visit, states this went well but she is very frustrated because she has been working on lifestyle modification since visit last month and has not made any progress. Currently taking a metabolism support supplement from [facility]. Does state her work schedule was previously her biggest hindrance but that has resolved over the last few weeks so she is getting back with her trainer at the gym more often now. Works for [employer] and has a very physical job, also runs some mornings before work. Very interested in starting some sort of medicine at this point.   States her depression and anxiety are doing well right now, no real concerns with either. Pt under a great deal of stress being a single mom and working full time, but going back to a more doable work schedule lately has really helped her moods. States her biggest trigger now is her frustration with her weight. No SI/HI, crying spells, anhedonia.    Depression Screen done today and results listed below:  Depression screen [facility] 2/9 08/28/2016 08/28/2016  Decreased Interest 0 0  Down, Depressed, Hopeless 0 0  PHQ - 2 Score 0 0    The patient does not have a history of falls. I did not complete a risk assessment for falls. A plan of care for falls was not documented.   Past Medical History:  Past Medical History:  Diagnosis Date  . Anemia   . Anxiety   . Cyst of perianal area    removed  . Depression   .  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 Gestational diabetes    diet controlled  . 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  . Hypertension    after pregnancy  . Shortness of breath   . UTI (lower urinary tract infection)     Surgical History:  Past Surgical History:  Procedure Laterality Date  . CESAREAN SECTION    . Pionidal cyst      Medications:  No current outpatient prescriptions on file prior to visit.   No current facility-administered medications on file prior to visit.     Allergies:  Allergies  Allergen Reactions  . Latex     rash    Social History:  Social History   Social History  . Marital status: Married    Spouse name: N/A  . Number of children: N/A  . Years of education: N/A   Occupational History  . Not on file.   Social History Main Topics  . Smoking status: Never Smoker  . Smokeless tobacco: Never Used  . Alcohol use No  . Drug use: No  . Sexual activity: Not Currently   Other Topics Concern  . Not on file   Social History Narrative  . No narrative  on file   History  Smoking Status  . Never Smoker  Smokeless Tobacco  . Never Used   History  Alcohol Use No    Family History:  Family History  Problem Relation Age of Onset  . Asthma Mother   . Cancer Daughter     placenta cancer  . Diabetes Maternal Aunt   . Heart disease Maternal Grandmother   . Stroke Neg Hx     Past medical history, surgical history, medications, allergies, family history and social history reviewed with patient today and changes made to appropriate areas of the chart.   Review of Systems - General ROS: positive for  - weight gain Psychological ROS: negative Ophthalmic ROS: negative ENT ROS: negative Breast ROS: negative for breast lumps Respiratory ROS: no cough, shortness of breath, or wheezing Cardiovascular ROS: no chest pain or dyspnea on  exertion Gastrointestinal ROS: no abdominal pain, change in bowel habits, or black or bloody stools Genito-Urinary ROS: no dysuria, trouble voiding, or hematuria Musculoskeletal ROS: negative Neurological ROS: no TIA or stroke symptoms Dermatological ROS: negative All other ROS negative except what is listed above and in the HPI.      Objective:    BP 108/70 (BP Location: Left Arm, Patient Position: Sitting, Cuff Size: Large)   Pulse 87   Temp 99 F (37.2 C) (Oral)   Resp 17   Ht 5\' 3"  (1.6 m)   Wt 213 lb (96.6 kg)   LMP 08/10/2016 (Approximate)   SpO2 99%   BMI 37.73 kg/m   Wt Readings from Last 3 Encounters:  08/28/16 213 lb (96.6 kg)  08/28/16 209 lb 8 oz (95 kg)  08/07/16 211 lb (95.7 kg)    Physical Exam  Constitutional: She is oriented to person, place, and time. She appears well-developed and well-nourished. No distress.  HENT:  Head: Atraumatic.  Right Ear: External ear normal.  Left Ear: External ear normal.  Nose: Nose normal.  Mouth/Throat: Oropharynx is clear and moist. No oropharyngeal exudate.  Eyes: Conjunctivae are normal. Pupils are equal, round, and reactive to light. No scleral icterus.  Neck: Normal range of motion. Neck supple. No thyromegaly present.  Cardiovascular: Normal rate, regular rhythm, normal heart sounds and intact distal pulses.   Pulmonary/Chest: Effort normal and breath sounds normal. No respiratory distress. Right breast exhibits no mass, no skin change and no tenderness. Left breast exhibits no mass, no skin change and no tenderness.  Abdominal: Soft. Bowel sounds are normal. She exhibits no distension. There is no tenderness.  Genitourinary: There is no lesion on the right labia. There is no lesion on the left labia. Cervix exhibits no friability. Right adnexum displays no mass and no tenderness. Left adnexum displays no mass and no tenderness. Vaginal discharge (thin grey/white discharge present) found.  Musculoskeletal: Normal range of  motion. She exhibits no edema or tenderness.  Lymphadenopathy:    She has no cervical adenopathy.    She has no axillary adenopathy.  Neurological: She is alert and oriented to person, place, and time.  Skin: Skin is warm and dry.  Psychiatric: She has a normal mood and affect. Her behavior is normal.  Nursing note and vitals reviewed.     Assessment & Plan:   Problem List Items Addressed This Visit      Other   Depression    Stable without medications currently. Will be starting Wellbutrin mostly for weight loss purposes, monitor closely for mood response. Will check in by phone in 1-2 weeks.  Relevant Medications   buPROPion (WELLBUTRIN XL) 150 MG 24 hr tablet   Anxiety    Stable without medications currently.       Relevant Medications   buPROPion (WELLBUTRIN XL) 150 MG 24 hr tablet    Other Visit Diagnoses    Annual physical exam    -  Primary   Await labs, pt non-fasting today.    Relevant Orders   CBC with Differential/Platelet (Completed)   Comprehensive metabolic panel (Completed)   Lipid Panel w/o Chol/HDL Ratio (Completed)   UA/M w/rflx Culture, Routine (Completed)   IGP, Aptima HPV, rfx 16/18,45   Weight gain       Continue with dietician and trainer, long discussion about further modifications. Discussed many medication options and pt agreeable to starting wellbutrin.    Relevant Orders   TSH (Completed)    Will start wellbutrin 150 mg QD x 1 week, then if tolerating will discuss increasing to 300. Risks and benefits discussed.   Follow up plan: Return in about 2 months (around 10/26/2016).   LABORATORY TESTING:  - Pap smear: pap done  IMMUNIZATIONS:   - Tdap: Tetanus vaccination status reviewed: last tetanus booster within 10 years. - Influenza: Up to date  PATIENT COUNSELING:   Advised to take 1 mg of folate supplement per day if capable of pregnancy.   Sexuality: Discussed sexually transmitted diseases, partner selection, use of condoms,  avoidance of unintended pregnancy  and contraceptive alternatives.   Advised to avoid cigarette smoking.  I discussed with the patient that most people either abstain from alcohol or drink within safe limits (<=14/week and <=4 drinks/occasion for males, <=7/weeks and <= 3 drinks/occasion for females) and that the risk for alcohol disorders and other health effects rises proportionally with the number of drinks per week and how often a drinker exceeds daily limits.  Discussed cessation/primary prevention of drug use and availability of treatment for abuse.   Diet: Encouraged to adjust caloric intake to maintain  or achieve ideal body weight, to reduce intake of dietary saturated fat and total fat, to limit sodium intake by avoiding high sodium foods and not adding table salt, and to maintain adequate dietary potassium and calcium preferably from fresh fruits, vegetables, and low-fat dairy products.    stressed the importance of regular exercise  Injury prevention: Discussed safety belts, safety helmets, smoke detector, smoking near bedding or upholstery.   Dental health: Discussed importance of regular tooth brushing, flossing, and dental visits.    NEXT PREVENTATIVE PHYSICAL DUE IN 1 YEAR. Return in about 2 months (around 10/26/2016).

## 2016-08-29 LAB — CBC WITH DIFFERENTIAL/PLATELET
BASOS: 1 %
Basophils Absolute: 0 10*3/uL (ref 0.0–0.2)
EOS (ABSOLUTE): 0.2 10*3/uL (ref 0.0–0.4)
EOS: 3 %
HEMATOCRIT: 35.5 % (ref 34.0–46.6)
HEMOGLOBIN: 11.8 g/dL (ref 11.1–15.9)
IMMATURE GRANS (ABS): 0 10*3/uL (ref 0.0–0.1)
Immature Granulocytes: 0 %
LYMPHS ABS: 2.1 10*3/uL (ref 0.7–3.1)
LYMPHS: 32 %
MCH: 28.9 pg (ref 26.6–33.0)
MCHC: 33.2 g/dL (ref 31.5–35.7)
MCV: 87 fL (ref 79–97)
MONOCYTES: 9 %
Monocytes Absolute: 0.6 10*3/uL (ref 0.1–0.9)
NEUTROS ABS: 3.6 10*3/uL (ref 1.4–7.0)
Neutrophils: 55 %
Platelets: 249 10*3/uL (ref 150–379)
RBC: 4.08 x10E6/uL (ref 3.77–5.28)
RDW: 14.1 % (ref 12.3–15.4)
WBC: 6.6 10*3/uL (ref 3.4–10.8)

## 2016-08-29 LAB — COMPREHENSIVE METABOLIC PANEL
ALBUMIN: 4 g/dL (ref 3.5–5.5)
ALK PHOS: 36 IU/L — AB (ref 39–117)
ALT: 16 IU/L (ref 0–32)
AST: 13 IU/L (ref 0–40)
Albumin/Globulin Ratio: 1.6 (ref 1.2–2.2)
BILIRUBIN TOTAL: 0.2 mg/dL (ref 0.0–1.2)
BUN / CREAT RATIO: 18 (ref 9–23)
BUN: 12 mg/dL (ref 6–20)
CHLORIDE: 106 mmol/L (ref 96–106)
CO2: 23 mmol/L (ref 18–29)
Calcium: 9.2 mg/dL (ref 8.7–10.2)
Creatinine, Ser: 0.67 mg/dL (ref 0.57–1.00)
GFR calc non Af Amer: 113 mL/min/{1.73_m2} (ref >59)
GFR, EST AFRICAN AMERICAN: 131 mL/min/{1.73_m2} (ref >59)
GLOBULIN, TOTAL: 2.5 g/dL (ref 1.5–4.5)
Glucose: 93 mg/dL (ref 65–99)
Potassium: 4.2 mmol/L (ref 3.5–5.2)
SODIUM: 144 mmol/L (ref 134–144)
TOTAL PROTEIN: 6.5 g/dL (ref 6.0–8.5)

## 2016-08-29 LAB — LIPID PANEL W/O CHOL/HDL RATIO
CHOLESTEROL TOTAL: 130 mg/dL (ref 100–199)
HDL: 35 mg/dL — ABNORMAL LOW (ref >39)
LDL CALC: 74 mg/dL (ref 0–99)
Triglycerides: 106 mg/dL (ref 0–149)
VLDL CHOLESTEROL CAL: 21 mg/dL (ref 5–40)

## 2016-08-29 LAB — TSH: TSH: 1.57 u[IU]/mL (ref 0.450–4.500)

## 2016-08-30 NOTE — Assessment & Plan Note (Addendum)
Stable without medications currently. Will be starting Wellbutrin mostly for weight loss purposes, monitor closely for mood response. Will check in by phone in 1-2 weeks.

## 2016-08-30 NOTE — Patient Instructions (Signed)
Follow up in 2 months

## 2016-08-30 NOTE — Assessment & Plan Note (Signed)
Stable without medications currently.

## 2016-09-01 LAB — IGP, APTIMA HPV, RFX 16/18,45
HPV Aptima: POSITIVE — AB
HPV GENOTYPE 18,45: NEGATIVE
HPV Genotype 16: NEGATIVE
PAP Smear Comment: 0

## 2016-09-04 ENCOUNTER — Telehealth: Payer: Self-pay | Admitting: Family Medicine

## 2016-09-04 DIAGNOSIS — B977 Papillomavirus as the cause of diseases classified elsewhere: Secondary | ICD-10-CM

## 2016-09-04 NOTE — Telephone Encounter (Signed)
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 Normally we just repeat in 1 year, but given her complicated history with the placental malignancy I would like her to see GYN to monitor this.

## 2016-09-04 NOTE — Telephone Encounter (Signed)
Patient notified.  Patient asked to speak to [facility] for more explaining.  Lynn Vargas consulted patient via phone.

## 2016-09-04 NOTE — Telephone Encounter (Signed)
Spoke with patient about results and answered all questions she had. Await GYN consult.

## 2016-09-14 ENCOUNTER — Telehealth: Payer: Self-pay | Admitting: Family Medicine

## 2016-09-14 NOTE — Telephone Encounter (Signed)
Patient requested Dr. Duke Salvia who is part of [facility]. Referral submitted to [facility].

## 2016-09-14 NOTE — Telephone Encounter (Signed)
Routing to referral coordinator.

## 2016-09-14 NOTE — Telephone Encounter (Signed)
Patient called to give information regarding the OB-GYN referral. She would like to be referred to Dr Delsa Sale [address] [place]  Thanks

## 2016-09-15 ENCOUNTER — Telehealth: Payer: Self-pay | Admitting: Family Medicine

## 2016-09-15 NOTE — Telephone Encounter (Signed)
Please call [name] with [facility] regarding the referral on this patient, she has a question about the abnormal PAP test  Thanks  [phone] [name]

## 2016-09-18 NOTE — Telephone Encounter (Signed)
Called [name] back.  She was transferred the referral from Dr. Glennon Mac b/c usually if it's HPV positive F/U in just one year.  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   [name] said she'd note that and send the referral back to Dr. Glennon Mac with all the information given.

## 2016-10-03 ENCOUNTER — Ambulatory Visit (INDEPENDENT_AMBULATORY_CARE_PROVIDER_SITE_OTHER): Payer: BLUE CROSS/BLUE SHIELD | Admitting: Family Medicine

## 2016-10-03 ENCOUNTER — Encounter: Payer: Self-pay | Admitting: Family Medicine

## 2016-10-03 VITALS — BP 115/81 | HR 70 | Temp 99.0°F | Wt 203.0 lb

## 2016-10-03 DIAGNOSIS — N898 Other specified noninflammatory disorders of vagina: Secondary | ICD-10-CM

## 2016-10-03 DIAGNOSIS — H6592 Unspecified nonsuppurative otitis media, left ear: Secondary | ICD-10-CM | POA: Diagnosis not present

## 2016-10-03 MED ORDER — FLUCONAZOLE 150 MG PO TABS: 150.0000 mg | ORAL_TABLET | Freq: Once | ORAL | 0 refills | Status: AC

## 2016-10-03 NOTE — Patient Instructions (Signed)
Follow up as scheduled.  

## 2016-10-03 NOTE — Progress Notes (Signed)
BP 115/81   Pulse 70   Temp 99 F (37.2 C)   Wt 203 lb (92.1 kg)   SpO2 100%   BMI 35.96 kg/m    Subjective:    Patient ID: Lynn Vargas, female    DOB: 02/04/1981, 36 y.o.   MRN: [id number]  HPI: Lynn Vargas is a 36 y.o. female  Chief Complaint  Patient presents with  . Ear Pain    left ear x 2 weeks, head congestion x 3-4 days, h/a, sinus pressure.  No fever, no runny nose, no sore throat.  . Vaginitis    discharge x 5 days, just started her period, some odor, white in color.  no itching or burning.   Patient presents with 2 weeks of intermittent left ear pain/pressure. Notes sinus pressure and drainage as well the past few days. Denies cough, sore throat, fever, chills, aches. Usually takes sudafed and finds good relief when she gets these symptoms but has not yet tried anything.   Also notes 5 days of vaginal discharge and itching. Hx of yeast infections, states that is what this feels like. Discharge is white, some odor noted. Has not tried OTC creams. Started period today.   Past Medical History:  Diagnosis Date  . Anemia   . Anxiety   . Cyst of perianal area    removed  . Depression   . 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 Gestational diabetes    diet controlled  . 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  . Hypertension    after pregnancy  . Shortness of breath   . UTI (lower urinary tract infection)    Social History   Social History  . Marital status: Married    Spouse name: N/A  . Number of children: N/A  . Years of education: N/A   Occupational History  . Not on file.   Social History Main Topics  . Smoking status: Never Smoker  . Smokeless tobacco: Never Used  . Alcohol use No  . Drug use: No  . Sexual activity: Not Currently   Other Topics Concern  . Not on file   Social History Narrative  . No narrative on file   Relevant  past medical, surgical, family and social history reviewed and updated as indicated. Interim medical history since our last visit reviewed. Allergies and medications reviewed and updated.  Review of Systems  Constitutional: Negative.   HENT: Positive for ear pain, rhinorrhea and sinus pressure.   Eyes: Negative.   Respiratory: Negative.   Cardiovascular: Negative.   Gastrointestinal: Negative.   Genitourinary: Positive for vaginal discharge.  Musculoskeletal: Negative.   Neurological: Negative.   Psychiatric/Behavioral: Negative.     Per HPI unless specifically indicated above     Objective:    BP 115/81   Pulse 70   Temp 99 F (37.2 C)   Wt 203 lb (92.1 kg)   SpO2 100%   BMI 35.96 kg/m   Wt Readings from Last 3 Encounters:  10/03/16 203 lb (92.1 kg)  08/28/16 213 lb (96.6 kg)  08/28/16 209 lb 8 oz (95 kg)    Physical Exam  Constitutional: She is oriented to person, place, and time. She appears well-developed and well-nourished. No distress.  HENT:  Head: Atraumatic.  Nasal mucosa boggy and erythematous Left middle ear effusion   Eyes: Conjunctivae are normal. Pupils are equal,  round, and reactive to light.  Neck: Normal range of motion. Neck supple.  Cardiovascular: Normal rate and normal heart sounds.   Pulmonary/Chest: Effort normal and breath sounds normal.  Genitourinary:  Genitourinary Comments: Deferred with shared decision making d/t period  Neurological: She is alert and oriented to person, place, and time.  Skin: Skin is warm and dry.  Psychiatric: She has a normal mood and affect. Her behavior is normal.  Nursing note and vitals reviewed.     Assessment & Plan:   Problem List Items Addressed This Visit    None    Visit Diagnoses    Left otitis media with effusion    -  Primary   Discussed sudafed, zyrtec, and flonase BID. Can take tylenol or ibuprofen prn for pain relief. Follow up if worsening or no improvement.    Relevant Medications    fluconazole (DIFLUCAN) 150 MG tablet   Vaginal discharge       Will treat empirically with diflucan tablet and OTC creams, wet prep if no improvement once period ends. Discussed probiotic tablet daily       Follow up plan: Return for as scheduled.

## 2016-10-04 ENCOUNTER — Telehealth: Payer: Self-pay | Admitting: Family Medicine

## 2016-10-04 NOTE — Telephone Encounter (Signed)
Patient is scheduled for May 9th

## 2016-10-04 NOTE — Telephone Encounter (Signed)
Judeen Hammans from [place] please call  [phone]  Thanks

## 2016-10-23 ENCOUNTER — Ambulatory Visit (INDEPENDENT_AMBULATORY_CARE_PROVIDER_SITE_OTHER): Payer: BLUE CROSS/BLUE SHIELD | Admitting: Family Medicine

## 2016-10-23 ENCOUNTER — Encounter: Payer: Self-pay | Admitting: Family Medicine

## 2016-10-23 VITALS — BP 92/64 | HR 92 | Temp 99.4°F | Ht 63.5 in | Wt 201.0 lb

## 2016-10-23 DIAGNOSIS — N760 Acute vaginitis: Secondary | ICD-10-CM

## 2016-10-23 DIAGNOSIS — B9689 Other specified bacterial agents as the cause of diseases classified elsewhere: Secondary | ICD-10-CM | POA: Diagnosis not present

## 2016-10-23 DIAGNOSIS — F330 Major depressive disorder, recurrent, mild: Secondary | ICD-10-CM | POA: Diagnosis not present

## 2016-10-23 LAB — WET PREP FOR TRICH, YEAST, CLUE
Clue Cell Exam: POSITIVE — AB
TRICHOMONAS EXAM: NEGATIVE
YEAST EXAM: NEGATIVE

## 2016-10-23 MED ORDER — BUPROPION HCL ER (XL) 300 MG PO TB24: 300.0000 mg | ORAL_TABLET | Freq: Every day | ORAL | 0 refills | Status: DC

## 2016-10-23 MED ORDER — METRONIDAZOLE 500 MG PO TABS: 500.0000 mg | ORAL_TABLET | Freq: Two times a day (BID) | ORAL | 0 refills | Status: DC

## 2016-10-23 NOTE — Progress Notes (Signed)
BP 92/64 (BP Location: Right Arm, Patient Position: Sitting, Cuff Size: Large)   Pulse 92   Temp 99.4 F (37.4 C)   Ht 5' 3.5" (1.613 m)   Wt 201 lb (91.2 kg)   LMP 10/03/2016 (Exact Date)   SpO2 99%   BMI 35.05 kg/m    Subjective:    Patient ID: Lynn Vargas, female    DOB: 08-07-1980, 36 y.o.   MRN: [id number]  HPI: Lynn Vargas is a 36 y.o. female  Chief Complaint  Patient presents with  . Follow-up  . Depression   Patient presents for follow up depression and weight loss. Doing very well with her moods and her weight loss, down 12 lb since starting the medication. Still working with her Physiological scientist and improving her diet. Does feel like she wants to increase the dose, as she feels a positive effect but like it could be more.   Also complains of vaginal odor the past few weeks. Recently took diflucan for yeast, and ever since has noticed this smell. Denies discharge recently. Not currently sexually active. Denies dysuria, itching, irregular bleeding.   Past Medical History:  Diagnosis Date  . Anemia   . Anxiety   . Cyst of perianal area    removed  . Depression   . 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 Gestational diabetes    diet controlled  . 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  . Hypertension    after pregnancy  . Shortness of breath   . UTI (lower urinary tract infection)    Social History   Social History  . Marital status: Married    Spouse name: N/A  . Number of children: N/A  . Years of education: N/A   Occupational History  . Not on file.   Social History Main Topics  . Smoking status: Never Smoker  . Smokeless tobacco: Never Used  . Alcohol use No  . Drug use: No  . Sexual activity: Not Currently   Other Topics Concern  . Not on file   Social History Narrative  . No narrative on file    Relevant past medical, surgical,  family and social history reviewed and updated as indicated. Interim medical history since our last visit reviewed. Allergies and medications reviewed and updated.  Depression screen [facility] 2/9 10/23/2016 08/28/2016 08/28/2016  Decreased Interest 0 0 0  Down, Depressed, Hopeless 0 0 0  PHQ - 2 Score 0 0 0  Altered sleeping 3 - -  Tired, decreased energy 3 - -  Feeling bad or failure about yourself  0 - -  Trouble concentrating 0 - -  Moving slowly or fidgety/restless 1 - -  Suicidal thoughts 0 - -  PHQ-9 Score 7 - -    Review of Systems  Constitutional: Negative.   HENT: Negative.   Respiratory: Negative.   Cardiovascular: Negative.   Gastrointestinal: Negative.   Genitourinary:       Vaginal odor  Musculoskeletal: Negative.   Neurological: Negative.   Psychiatric/Behavioral: Negative.     Per HPI unless specifically indicated above     Objective:    BP 92/64 (BP Location: Right Arm, Patient Position: Sitting, Cuff Size: Large)   Pulse 92   Temp 99.4 F (37.4 C)   Ht 5' 3.5" (1.613 m)   Wt 201 lb (91.2 kg)   LMP 10/03/2016 (Exact  Date)   SpO2 99%   BMI 35.05 kg/m   Wt Readings from Last 3 Encounters:  10/23/16 201 lb (91.2 kg)  10/03/16 203 lb (92.1 kg)  08/28/16 213 lb (96.6 kg)    Physical Exam  Constitutional: She is oriented to person, place, and time. She appears well-developed and well-nourished.  HENT:  Head: Atraumatic.  Eyes: Conjunctivae are normal. Pupils are equal, round, and reactive to light.  Neck: Normal range of motion. Neck supple.  Cardiovascular: Normal rate and normal heart sounds.   Pulmonary/Chest: Effort normal and breath sounds normal.  Genitourinary: Vaginal discharge (thin white discharge present) found.  Musculoskeletal: Normal range of motion.  Neurological: She is alert and oriented to person, place, and time.  Skin: Skin is warm and dry.  Psychiatric: She has a normal mood and affect. Her behavior is normal.  Nursing note and  vitals reviewed.     Assessment & Plan:   Problem List Items Addressed This Visit      Other   Depression - Primary    Doing very well on wellbutrin, will increase to 300 mg. Continue good lifestyle modifications, f/u with any concerns prior to scheduled f/u in 3 months      Relevant Medications   buPROPion (WELLBUTRIN XL) 300 MG 24 hr tablet    Other Visit Diagnoses    BV (bacterial vaginosis)       Wet prep + for clue cells. Flagyl sent, discussed probiotic, gentle soaps, d/c summer's eve products. F/u if no improvement   Relevant Medications   metroNIDAZOLE (FLAGYL) 500 MG tablet   Other Relevant Orders   WET PREP FOR [place], YEAST, CLUE (Completed)       Follow up plan: Return in about 3 months (around 01/22/2017) for Depression.

## 2016-10-23 NOTE — Assessment & Plan Note (Signed)
Doing very well on wellbutrin, will increase to 300 mg. Continue good lifestyle modifications, f/u with any concerns prior to scheduled f/u in 3 months

## 2016-10-23 NOTE — Patient Instructions (Signed)
Follow up in 3 months

## 2016-10-27 ENCOUNTER — Other Ambulatory Visit: Payer: Self-pay | Admitting: Family Medicine

## 2016-11-10 ENCOUNTER — Telehealth: Payer: Self-pay | Admitting: Family Medicine

## 2016-11-10 NOTE — Telephone Encounter (Signed)
Not sure it's got to do with her medication, this is a new symptom and she's been on it a while so she should probably come in to talk about it

## 2016-11-10 NOTE — Telephone Encounter (Signed)
Routing to provider for advice.

## 2016-11-10 NOTE — Telephone Encounter (Signed)
Patient would like to speak to [facility] regarding her antidepressant, she does not feel it is working. She has had stomach pain for 3 days.  [phone]

## 2016-11-10 NOTE — Telephone Encounter (Signed)
Called patient.  Voice mailbox is full, unable to leave a message.

## 2016-11-13 NOTE — Telephone Encounter (Signed)
Spoke with patient, she states she went to [facility] and she stopped all of her meds. They think it was her increased iron dosage that caused her pain. She just restarted her Wellbutrin today. She will let us know if her symptoms return.

## 2016-11-20 ENCOUNTER — Other Ambulatory Visit: Payer: Self-pay | Admitting: Occupational Medicine

## 2016-11-20 ENCOUNTER — Ambulatory Visit: Payer: Self-pay

## 2016-11-20 DIAGNOSIS — M25531 Pain in right wrist: Secondary | ICD-10-CM

## 2017-02-05 ENCOUNTER — Encounter: Payer: Self-pay | Admitting: Family Medicine

## 2017-02-05 ENCOUNTER — Ambulatory Visit (INDEPENDENT_AMBULATORY_CARE_PROVIDER_SITE_OTHER): Payer: BLUE CROSS/BLUE SHIELD | Admitting: Family Medicine

## 2017-02-05 VITALS — BP 105/70 | HR 87 | Temp 98.6°F | Wt 199.0 lb

## 2017-02-05 DIAGNOSIS — B9689 Other specified bacterial agents as the cause of diseases classified elsewhere: Secondary | ICD-10-CM

## 2017-02-05 DIAGNOSIS — N760 Acute vaginitis: Secondary | ICD-10-CM

## 2017-02-05 DIAGNOSIS — F330 Major depressive disorder, recurrent, mild: Secondary | ICD-10-CM

## 2017-02-05 MED ORDER — METRONIDAZOLE 500 MG PO TABS: 500.0000 mg | ORAL_TABLET | Freq: Two times a day (BID) | ORAL | 0 refills | Status: DC

## 2017-02-05 MED ORDER — BUPROPION HCL ER (XL) 300 MG PO TB24: 300.0000 mg | ORAL_TABLET | Freq: Every day | ORAL | 1 refills | Status: DC

## 2017-02-05 NOTE — Progress Notes (Signed)
BP 105/70   Pulse 87   Temp 98.6 F (37 C)   Wt 199 lb (90.3 kg)   LMP 01/14/2017 (Exact Date)   SpO2 99%   BMI 34.70 kg/m    Subjective:    Patient ID: Lynn Vargas, female    DOB: Jun 20, 1981, 36 y.o.   MRN: [id number]  HPI: Lynn Vargas is a 36 y.o. female  Chief Complaint  Patient presents with  . Depression    follow up   Patient presents for depression and weight loss f/u today. Doing very well, feels happier than she has in years. Is down 15 lb from starting weight and still working diligently with her trainer as well as on her diet. No side effects from the medication. Taking faithfully.   Does feel like her BV infection is back. Having odor, discharge, and some pressure in the area again as she did before. Still taking her probiotics daily, and practicing good feminine hygiene tactics. Denies dysuria, itching, lesions.   Past Medical History:  Diagnosis Date  . Anemia   . Anxiety   . Cyst of perianal area    removed  . Depression   . 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 Gestational diabetes    diet controlled  . 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  . Hypertension    after pregnancy  . Shortness of breath   . UTI (lower urinary tract infection)    Social History   Social History  . Marital status: Married    Spouse name: N/A  . Number of children: N/A  . Years of education: N/A   Occupational History  . Not on file.   Social History Main Topics  . Smoking status: Never Smoker  . Smokeless tobacco: Never Used  . Alcohol use No  . Drug use: No  . Sexual activity: Not Currently   Other Topics Concern  . Not on file   Social History Narrative  . No narrative on file   Relevant past medical, surgical, family and social history reviewed and updated as indicated. Interim medical history since our last visit reviewed. Allergies and  medications reviewed and updated.  Review of Systems  Constitutional: Negative.   HENT: Negative.   Eyes: Negative.   Respiratory: Negative.   Cardiovascular: Negative.   Genitourinary: Positive for pelvic pain (pressure) and vaginal discharge.       Vaginal odor   Musculoskeletal: Negative.   Neurological: Negative.   Psychiatric/Behavioral: Negative.    Per HPI unless specifically indicated above     Objective:    BP 105/70   Pulse 87   Temp 98.6 F (37 C)   Wt 199 lb (90.3 kg)   LMP 01/14/2017 (Exact Date)   SpO2 99%   BMI 34.70 kg/m   Wt Readings from Last 3 Encounters:  02/05/17 199 lb (90.3 kg)  10/23/16 201 lb (91.2 kg)  10/03/16 203 lb (92.1 kg)    Physical Exam  Constitutional: She is oriented to person, place, and time. She appears well-developed and well-nourished. No distress.  Cardiovascular: Normal rate and normal heart sounds.   Pulmonary/Chest: Effort normal and breath sounds normal. No respiratory distress.  Musculoskeletal: Normal range of motion.  Neurological: She is alert and oriented to person, place, and time.  Skin: Skin is warm.  Nursing note and vitals reviewed.  Results for orders placed  or performed in visit on 10/23/16  WET PREP FOR [place], YEAST, CLUE  Result Value Ref Range   Trichomonas Exam Negative Negative   Yeast Exam Negative Negative   Clue Cell Exam Positive (A) Negative      Assessment & Plan:   Problem List Items Addressed This Visit      Other   Depression - Primary    Doing very well on 300 mg wellbutrin. Continue current regimen. Continue good lifestyle modifications.       Relevant Medications   buPROPion (WELLBUTRIN XL) 300 MG 24 hr tablet    Other Visit Diagnoses    BV (bacterial vaginosis)       Will start another round of flagyl. Continue probiotic and good hygiene. F/u if no improvement. Will start topical for prevention if recurring   Relevant Medications   metroNIDAZOLE (FLAGYL) 500 MG tablet         Follow up plan: Return in about 7 months (around 09/06/2017) for CPE, depression follow up.

## 2017-02-05 NOTE — Patient Instructions (Signed)
Follow up in 6 months 

## 2017-02-05 NOTE — Assessment & Plan Note (Signed)
Doing very well on 300 mg wellbutrin. Continue current regimen. Continue good lifestyle modifications.

## 2017-03-29 ENCOUNTER — Ambulatory Visit (INDEPENDENT_AMBULATORY_CARE_PROVIDER_SITE_OTHER): Payer: BLUE CROSS/BLUE SHIELD | Admitting: Family Medicine

## 2017-03-29 ENCOUNTER — Ambulatory Visit: Payer: BLUE CROSS/BLUE SHIELD | Admitting: Family Medicine

## 2017-03-29 ENCOUNTER — Encounter: Payer: Self-pay | Admitting: Family Medicine

## 2017-03-29 VITALS — BP 104/69 | HR 88 | Temp 99.3°F | Wt 198.0 lb

## 2017-03-29 DIAGNOSIS — R5383 Other fatigue: Secondary | ICD-10-CM | POA: Diagnosis not present

## 2017-03-29 DIAGNOSIS — D649 Anemia, unspecified: Secondary | ICD-10-CM

## 2017-03-29 NOTE — Progress Notes (Signed)
BP 104/69   Pulse 88   Temp 99.3 F (37.4 C)   Wt 198 lb (89.8 kg)   LMP 03/10/2017 (Exact Date)   SpO2 98%   Breastfeeding? No   BMI 34.52 kg/m    Subjective:    Patient ID: Lynn Vargas, female    DOB: Sep 27, 1980, 36 y.o.   MRN: [id number]  HPI: Lynn Vargas is a 36 y.o. female  Chief Complaint  Patient presents with  . Glucose    she wants to make sure she doesn't have DM. Had gestational diabetes and now after she eats something sweet she feels stange and falls into a deep sleep.   Patient presents today with concerns about her blood sugar. Noticing lately that if she has anything sweet lately she gets really sleepy and will fall asleep. States she always drinks lots of water and urinates frequently so unsure if more so lately. Had Gestational DM with one of her pregnancies. Does not have a primary fhx of DM.   On some new supplements for vitamins/weight loss, also takes iron pills nightly. Does have a hx of anemia, wanting levels checked.   Past Medical History:  Diagnosis Date  . Anemia   . Anxiety   . Cyst of perianal area    removed  . Depression   . 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 Gestational diabetes    diet controlled  . 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  . Hypertension    after pregnancy  . Shortness of breath   . UTI (lower urinary tract infection)    Social History   Social History  . Marital status: Married    Spouse name: N/A  . Number of children: N/A  . Years of education: N/A   Occupational History  . Not on file.   Social History Main Topics  . Smoking status: Never Smoker  . Smokeless tobacco: Never Used  . Alcohol use No  . Drug use: No  . Sexual activity: Not Currently   Other Topics Concern  . Not on file   Social History Narrative  . No narrative on file     Relevant past medical, surgical, family and  social history reviewed and updated as indicated. Interim medical history since our last visit reviewed. Allergies and medications reviewed and updated.  Review of Systems  Constitutional: Positive for fatigue.  HENT: Negative.   Respiratory: Negative.   Cardiovascular: Negative.   Gastrointestinal: Negative.  Negative for nausea and vomiting.  Endocrine: Negative for polydipsia, polyphagia and polyuria.  Genitourinary: Negative.   Musculoskeletal: Negative.   Neurological: Negative.   Psychiatric/Behavioral: Negative.    Per HPI unless specifically indicated above     Objective:    BP 104/69   Pulse 88   Temp 99.3 F (37.4 C)   Wt 198 lb (89.8 kg)   LMP 03/10/2017 (Exact Date)   SpO2 98%   Breastfeeding? No   BMI 34.52 kg/m   Wt Readings from Last 3 Encounters:  03/29/17 198 lb (89.8 kg)  02/05/17 199 lb (90.3 kg)  10/23/16 201 lb (91.2 kg)    Physical Exam  Constitutional: She is oriented to person, place, and time. She appears well-developed and well-nourished. No distress.  HENT:  Head: Atraumatic.  Eyes: Pupils are equal, round, and reactive to light. No scleral icterus.  Neck: Normal range of motion.  Neck supple.  Cardiovascular: Normal rate and normal heart sounds.   Pulmonary/Chest: Effort normal and breath sounds normal. No respiratory distress.  Musculoskeletal: Normal range of motion.  Neurological: She is alert and oriented to person, place, and time.  Skin: Skin is warm and dry.  Psychiatric: She has a normal mood and affect. Her behavior is normal.  Nursing note and vitals reviewed.   Results for orders placed or performed in visit on 70/01/74  Basic Metabolic Panel (BMET)  Result Value Ref Range   Glucose 98 65 - 99 mg/dL   BUN 13 6 - 20 mg/dL   Creatinine, Ser 0.87 0.57 - 1.00 mg/dL   GFR calc non Af Amer 86 >59 mL/min/1.73   GFR calc Af Amer 99 >59 mL/min/1.73   BUN/Creatinine Ratio 15 9 - 23   Sodium 135 134 - 144 mmol/L   Potassium 4.3  3.5 - 5.2 mmol/L   Chloride 100 96 - 106 mmol/L   CO2 21 20 - 29 mmol/L   Calcium 9.4 8.7 - 10.2 mg/dL  HgB A1c  Result Value Ref Range   Hgb A1c MFr Bld 5.1 4.8 - 5.6 %   Est. average glucose Bld gHb Est-mCnc 100 mg/dL  CBC with Differential/Platelet  Result Value Ref Range   WBC 5.2 3.4 - 10.8 x10E3/uL   RBC 4.25 3.77 - 5.28 x10E6/uL   Hemoglobin 12.5 11.1 - 15.9 g/dL   Hematocrit 37.2 34.0 - 46.6 %   MCV 88 79 - 97 fL   MCH 29.4 26.6 - 33.0 pg   MCHC 33.6 31.5 - 35.7 g/dL   RDW 14.6 12.3 - 15.4 %   Platelets 232 150 - 379 x10E3/uL   Neutrophils 62 Not Estab. %   Lymphs 25 Not Estab. %   Monocytes 8 Not Estab. %   Eos 4 Not Estab. %   Basos 1 Not Estab. %   Neutrophils Absolute 3.2 1.4 - 7.0 x10E3/uL   Lymphocytes Absolute 1.3 0.7 - 3.1 x10E3/uL   Monocytes Absolute 0.4 0.1 - 0.9 x10E3/uL   EOS (ABSOLUTE) 0.2 0.0 - 0.4 x10E3/uL   Basophils Absolute 0.0 0.0 - 0.2 x10E3/uL   Immature Granulocytes 0 Not Estab. %   Immature Grans (Abs) 0.0 0.0 - 0.1 x10E3/uL      Assessment & Plan:   Problem List Items Addressed This Visit      Other   Anemia    Will check levels today given her hx of anemia and recent fatigue. Continue supplements and good balanced diet.       Relevant Orders   CBC with Differential/Platelet (Completed)    Other Visit Diagnoses    Fatigue, unspecified type    -  Primary   Pt concerned for diabetes. Will check BMP and A1C today. Discussed continuing good lifestyle modifications, low GI foods, drinking plenty of water   Relevant Orders   Basic Metabolic Panel (BMET) (Completed)   HgB A1c (Completed)   CBC with Differential/Platelet (Completed)       Follow up plan: Return for as scheduled.

## 2017-03-30 LAB — CBC WITH DIFFERENTIAL/PLATELET
BASOS ABS: 0 10*3/uL (ref 0.0–0.2)
Basos: 1 %
EOS (ABSOLUTE): 0.2 10*3/uL (ref 0.0–0.4)
Eos: 4 %
HEMATOCRIT: 37.2 % (ref 34.0–46.6)
Hemoglobin: 12.5 g/dL (ref 11.1–15.9)
Immature Grans (Abs): 0 10*3/uL (ref 0.0–0.1)
Immature Granulocytes: 0 %
LYMPHS ABS: 1.3 10*3/uL (ref 0.7–3.1)
Lymphs: 25 %
MCH: 29.4 pg (ref 26.6–33.0)
MCHC: 33.6 g/dL (ref 31.5–35.7)
MCV: 88 fL (ref 79–97)
MONOS ABS: 0.4 10*3/uL (ref 0.1–0.9)
Monocytes: 8 %
NEUTROS PCT: 62 %
Neutrophils Absolute: 3.2 10*3/uL (ref 1.4–7.0)
Platelets: 232 10*3/uL (ref 150–379)
RBC: 4.25 x10E6/uL (ref 3.77–5.28)
RDW: 14.6 % (ref 12.3–15.4)
WBC: 5.2 10*3/uL (ref 3.4–10.8)

## 2017-03-30 LAB — BASIC METABOLIC PANEL
BUN/Creatinine Ratio: 15 (ref 9–23)
BUN: 13 mg/dL (ref 6–20)
CALCIUM: 9.4 mg/dL (ref 8.7–10.2)
CHLORIDE: 100 mmol/L (ref 96–106)
CO2: 21 mmol/L (ref 20–29)
Creatinine, Ser: 0.87 mg/dL (ref 0.57–1.00)
GFR calc Af Amer: 99 mL/min/{1.73_m2} (ref >59)
GFR, EST NON AFRICAN AMERICAN: 86 mL/min/{1.73_m2} (ref >59)
Glucose: 98 mg/dL (ref 65–99)
POTASSIUM: 4.3 mmol/L (ref 3.5–5.2)
Sodium: 135 mmol/L (ref 134–144)

## 2017-03-30 LAB — HEMOGLOBIN A1C
ESTIMATED AVERAGE GLUCOSE: 100 mg/dL
Hgb A1c MFr Bld: 5.1 % (ref 4.8–5.6)

## 2017-04-01 NOTE — Assessment & Plan Note (Signed)
Will check levels today given her hx of anemia and recent fatigue. Continue supplements and good balanced diet.

## 2017-04-01 NOTE — Patient Instructions (Signed)
Follow up as needed

## 2017-07-13 ENCOUNTER — Encounter: Payer: Self-pay | Admitting: Family Medicine

## 2017-07-15 ENCOUNTER — Other Ambulatory Visit: Payer: Self-pay | Admitting: Family Medicine

## 2017-08-03 DIAGNOSIS — M778 Other enthesopathies, not elsewhere classified: Secondary | ICD-10-CM | POA: Insufficient documentation

## 2017-08-29 ENCOUNTER — Other Ambulatory Visit: Payer: Self-pay | Admitting: Family Medicine

## 2017-08-29 MED ORDER — BUPROPION HCL ER (XL) 300 MG PO TB24: 300.0000 mg | ORAL_TABLET | Freq: Every day | ORAL | 1 refills | Status: DC

## 2017-08-29 NOTE — Telephone Encounter (Signed)
Copied from [place]. Topic: Quick Communication - Rx Refill/Question >> Aug 29, 2017 10:56 AM Lynn Vargas wrote: Contact pt to advise for med refill, pt would like a phone call today  Medication:  buPROPion (WELLBUTRIN XL) 300 MG 24 hr tablet [[id number]]    Preferred Pharmacy (with phone number or street name): [facility]   Agent: Please be advised that RX refills may take up to 3 business days. We ask that you follow-up with your pharmacy.

## 2017-08-29 NOTE — Telephone Encounter (Signed)
Upcoming appointment 09/07/2017

## 2017-08-30 ENCOUNTER — Encounter: Payer: BLUE CROSS/BLUE SHIELD | Admitting: Family Medicine

## 2017-09-03 ENCOUNTER — Encounter: Payer: BLUE CROSS/BLUE SHIELD | Admitting: Family Medicine

## 2017-09-07 ENCOUNTER — Ambulatory Visit (INDEPENDENT_AMBULATORY_CARE_PROVIDER_SITE_OTHER): Payer: BLUE CROSS/BLUE SHIELD | Admitting: Family Medicine

## 2017-09-07 ENCOUNTER — Encounter: Payer: Self-pay | Admitting: Family Medicine

## 2017-09-07 VITALS — BP 108/76 | HR 84 | Temp 98.3°F | Ht 62.25 in | Wt 192.2 lb

## 2017-09-07 DIAGNOSIS — N898 Other specified noninflammatory disorders of vagina: Secondary | ICD-10-CM

## 2017-09-07 DIAGNOSIS — Z0001 Encounter for general adult medical examination with abnormal findings: Secondary | ICD-10-CM | POA: Diagnosis not present

## 2017-09-07 DIAGNOSIS — F330 Major depressive disorder, recurrent, mild: Secondary | ICD-10-CM

## 2017-09-07 DIAGNOSIS — Z0000 Encounter for general adult medical examination without abnormal findings: Secondary | ICD-10-CM

## 2017-09-07 LAB — UA/M W/RFLX CULTURE, ROUTINE
Bilirubin, UA: NEGATIVE
Glucose, UA: NEGATIVE
Ketones, UA: NEGATIVE
Leukocytes, UA: NEGATIVE
NITRITE UA: NEGATIVE
PH UA: 6 (ref 5.0–7.5)
Protein, UA: NEGATIVE
Specific Gravity, UA: 1.01 (ref 1.005–1.030)
UUROB: 0.2 mg/dL (ref 0.2–1.0)

## 2017-09-07 LAB — WET PREP FOR TRICH, YEAST, CLUE
Clue Cell Exam: NEGATIVE
Trichomonas Exam: NEGATIVE
Yeast Exam: NEGATIVE

## 2017-09-07 LAB — MICROSCOPIC EXAMINATION
Bacteria, UA: NONE SEEN
WBC, UA: NONE SEEN /hpf (ref 0–?)

## 2017-09-07 MED ORDER — BUPROPION HCL ER (XL) 300 MG PO TB24: 300.0000 mg | ORAL_TABLET | Freq: Every day | ORAL | 1 refills | Status: DC

## 2017-09-07 NOTE — Progress Notes (Signed)
BP 108/76 (BP Location: Right Arm, Patient Position: Sitting, Cuff Size: Normal)   Pulse 84   Temp 98.3 F (36.8 C) (Oral)   Ht 5' 2.25" (1.581 m)   Wt 192 lb 3.2 oz (87.2 kg)   SpO2 100%   BMI 34.87 kg/m    Subjective:    Patient ID: Lynn Vargas, female    DOB: [birth year], 37 y.o.   MRN: [id number]  HPI: Lynn STRAUSSER is a 37 y.o. female presenting on 09/07/2017 for comprehensive medical examination. Current medical complaints include:none other than some vaginal discharge and change in odor the past week or so.   Doing very well with wellbutrin, feels her moods have improved significantly since starting it. Has also made major lifestyle changes the past year and has lost and kept off about 20 lb. Denies side effects, takes faithfully.   Depression Screen done today and results listed below:  Depression screen [facility] 2/9 09/07/2017 02/05/2017 10/23/2016 08/28/2016 08/28/2016  Decreased Interest 0 0 0 0 0  Down, Depressed, Hopeless 0 - 0 0 0  PHQ - 2 Score 0 0 0 0 0  Altered sleeping 3 - 3 - -  Tired, decreased energy 1 - 3 - -  Change in appetite 1 - - - -  Feeling bad or failure about yourself  0 - 0 - -  Trouble concentrating 0 - 0 - -  Moving slowly or fidgety/restless 0 - 1 - -  Suicidal thoughts 0 - 0 - -  PHQ-9 Score 5 - 7 - -    The patient does not have a history of falls. I did not complete a risk assessment for falls. A plan of care for falls was not documented.   Past Medical History:  Past Medical History:  Diagnosis Date  . Anemia   . Anxiety   . Cyst of perianal area    removed  . Depression   . 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 Gestational diabetes    diet controlled  . 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  . Hypertension    after pregnancy  . Shortness of breath   . UTI (lower urinary tract infection)     Surgical History:  Past Surgical  History:  Procedure Laterality Date  . CESAREAN SECTION    . Pionidal cyst      Medications:  No current outpatient medications on file prior to visit.   No current facility-administered medications on file prior to visit.     Allergies:  Allergies  Allergen Reactions  . Latex     rash    Social History:  Social History   Socioeconomic History  . Marital status: Married    Spouse name: Not on file  . Number of children: Not on file  . Years of education: Not on file  . Highest education level: Not on file  Social Needs  . Financial resource strain: Not on file  . Food insecurity - worry: Not on file  . Food insecurity - inability: Not on file  . Transportation needs - medical: Not on file  . Transportation needs - non-medical: Not on file  Occupational History  . Not on file  Tobacco Use  . Smoking status: Never Smoker  . Smokeless tobacco: Never Used  Substance and Sexual Activity  . Alcohol use: No  . Drug use: No  .  Sexual activity: Not Currently  Other Topics Concern  . Not on file  Social History Narrative  . Not on file   Social History   Tobacco Use  Smoking Status Never Smoker  Smokeless Tobacco Never Used   Social History   Substance and Sexual Activity  Alcohol Use No    Family History:  Family History  Problem Relation Age of Onset  . Asthma Mother   . Cancer Daughter        placenta cancer  . Diabetes Maternal Aunt   . Heart disease Maternal Grandmother   . Stroke Neg Hx     Past medical history, surgical history, medications, allergies, family history and social history reviewed with patient today and changes made to appropriate areas of the chart.   Review of Systems - General ROS: negative Psychological ROS: negative Ophthalmic ROS: negative ENT ROS: negative Allergy and Immunology ROS: negative Breast ROS: negative for breast lumps Respiratory ROS: no cough, shortness of breath, or wheezing Cardiovascular ROS: no chest  pain or dyspnea on exertion Gastrointestinal ROS: no abdominal pain, change in bowel habits, or black or bloody stools Genito-Urinary ROS: no dysuria, trouble voiding, or hematuria Musculoskeletal ROS: negative Neurological ROS: no TIA or stroke symptoms Dermatological ROS: negative All other ROS negative except what is listed above and in the HPI.      Objective:    BP 108/76 (BP Location: Right Arm, Patient Position: Sitting, Cuff Size: Normal)   Pulse 84   Temp 98.3 F (36.8 C) (Oral)   Ht 5' 2.25" (1.581 m)   Wt 192 lb 3.2 oz (87.2 kg)   SpO2 100%   BMI 34.87 kg/m   Wt Readings from Last 3 Encounters:  09/07/17 192 lb 3.2 oz (87.2 kg)  03/29/17 198 lb (89.8 kg)  02/05/17 199 lb (90.3 kg)    Physical Exam  Constitutional: She is oriented to person, place, and time. She appears well-developed and well-nourished. No distress.  HENT:  Head: Atraumatic.  Right Ear: External ear normal.  Left Ear: External ear normal.  Nose: Nose normal.  Mouth/Throat: Oropharynx is clear and moist. No oropharyngeal exudate.  Eyes: Conjunctivae are normal. Pupils are equal, round, and reactive to light. No scleral icterus.  Neck: Normal range of motion. Neck supple. No thyromegaly present.  Cardiovascular: Normal rate, regular rhythm, normal heart sounds and intact distal pulses.  Pulmonary/Chest: Effort normal and breath sounds normal. No respiratory distress.  Breast exam declined  Abdominal: Soft. Bowel sounds are normal. She exhibits no mass. There is no tenderness.  Musculoskeletal: Normal range of motion. She exhibits no edema or tenderness.  Lymphadenopathy:    She has no cervical adenopathy.  Neurological: She is alert and oriented to person, place, and time. No cranial nerve deficit.  Skin: Skin is warm and dry. No rash noted.  Psychiatric: She has a normal mood and affect. Her behavior is normal.  Nursing note and vitals reviewed.   Results for orders placed or performed in  visit on 09/07/17  WET PREP FOR [place], YEAST, CLUE  Result Value Ref Range   Trichomonas Exam Negative Negative   Yeast Exam Negative Negative   Clue Cell Exam Negative Negative  Microscopic Examination  Result Value Ref Range   WBC, UA None seen 0 - 5 /hpf   RBC, UA 0-2 0 - 2 /hpf   Epithelial Cells (non renal) 0-10 0 - 10 /hpf   Bacteria, UA None seen None seen/Few  CBC with Differential/Platelet  Result Value Ref Range   WBC 6.1 3.4 - 10.8 x10E3/uL   RBC 4.20 3.77 - 5.28 x10E6/uL   Hemoglobin 12.7 11.1 - 15.9 g/dL   Hematocrit 36.9 34.0 - 46.6 %   MCV 88 79 - 97 fL   MCH 30.2 26.6 - 33.0 pg   MCHC 34.4 31.5 - 35.7 g/dL   RDW 14.1 12.3 - 15.4 %   Platelets 227 150 - 379 x10E3/uL   Neutrophils 59 Not Estab. %   Lymphs 29 Not Estab. %   Monocytes 6 Not Estab. %   Eos 5 Not Estab. %   Basos 1 Not Estab. %   Neutrophils Absolute 3.6 1.4 - 7.0 x10E3/uL   Lymphocytes Absolute 1.8 0.7 - 3.1 x10E3/uL   Monocytes Absolute 0.4 0.1 - 0.9 x10E3/uL   EOS (ABSOLUTE) 0.3 0.0 - 0.4 x10E3/uL   Basophils Absolute 0.0 0.0 - 0.2 x10E3/uL   Immature Granulocytes 0 Not Estab. %   Immature Grans (Abs) 0.0 0.0 - 0.1 x10E3/uL  Comprehensive metabolic panel  Result Value Ref Range   Glucose 88 65 - 99 mg/dL   BUN 13 6 - 20 mg/dL   Creatinine, Ser 0.78 0.57 - 1.00 mg/dL   GFR calc non Af Amer 97 >59 mL/min/1.73   GFR calc Af Amer 112 >59 mL/min/1.73   BUN/Creatinine Ratio 17 9 - 23   Sodium 137 134 - 144 mmol/L   Potassium 4.3 3.5 - 5.2 mmol/L   Chloride 102 96 - 106 mmol/L   CO2 22 20 - 29 mmol/L   Calcium 9.2 8.7 - 10.2 mg/dL   Total Protein 7.0 6.0 - 8.5 g/dL   Albumin 4.3 3.5 - 5.5 g/dL   Globulin, Total 2.7 1.5 - 4.5 g/dL   Albumin/Globulin Ratio 1.6 1.2 - 2.2   Bilirubin Total 0.5 0.0 - 1.2 mg/dL   Alkaline Phosphatase 43 39 - 117 IU/L   AST 18 0 - 40 IU/L   ALT 15 0 - 32 IU/L  Lipid Panel w/o Chol/HDL Ratio  Result Value Ref Range   Cholesterol, Total 154 100 - 199 mg/dL    Triglycerides 39 0 - 149 mg/dL   HDL 57 >39 mg/dL   VLDL Cholesterol Cal 8 5 - 40 mg/dL   LDL Calculated 89 0 - 99 mg/dL  TSH  Result Value Ref Range   TSH 1.940 0.450 - 4.500 uIU/mL  UA/M w/rflx Culture, Routine  Result Value Ref Range   Specific Gravity, UA 1.010 1.005 - 1.030   pH, UA 6.0 5.0 - 7.5   Color, UA Yellow Yellow   Appearance Ur Clear Clear   Leukocytes, UA Negative Negative   Protein, UA Negative Negative/Trace   Glucose, UA Negative Negative   Ketones, UA Negative Negative   RBC, UA Trace (A) Negative   Bilirubin, UA Negative Negative   Urobilinogen, Ur 0.2 0.2 - 1.0 mg/dL   Nitrite, UA Negative Negative   Microscopic Examination See below:       Assessment & Plan:   Problem List Items Addressed This Visit      Other   Depression    Stable and under good control with wellbutrin. Continue current regimen      Relevant Medications   buPROPion (WELLBUTRIN XL) 300 MG 24 hr tablet    Other Visit Diagnoses    Vaginal discharge    -  Primary   Negative wet prep. Start probiotics, use gentle unscented soaps, f/u with any changes or worseening course  Relevant Orders   WET PREP FOR [place] (Completed)   Annual physical exam       Relevant Orders   CBC with Differential/Platelet (Completed)   Comprehensive metabolic panel (Completed)   Lipid Panel w/o Chol/HDL Ratio (Completed)   TSH (Completed)   UA/M w/rflx Culture, Routine (Completed)       Follow up plan: Return in about 6 months (around 03/10/2018) for Mood f/u.   LABORATORY TESTING:  - Pap smear: up to date  IMMUNIZATIONS:   - Tdap: Tetanus vaccination status reviewed: last tetanus booster within 10 years. - Influenza: Refused  PATIENT COUNSELING:   Advised to take 1 mg of folate supplement per day if capable of pregnancy.   Sexuality: Discussed sexually transmitted diseases, partner selection, use of condoms, avoidance of unintended pregnancy  and contraceptive alternatives.    Advised to avoid cigarette smoking.  I discussed with the patient that most people either abstain from alcohol or drink within safe limits (<=14/week and <=4 drinks/occasion for males, <=7/weeks and <= 3 drinks/occasion for females) and that the risk for alcohol disorders and other health effects rises proportionally with the number of drinks per week and how often a drinker exceeds daily limits.  Discussed cessation/primary prevention of drug use and availability of treatment for abuse.   Diet: Encouraged to adjust caloric intake to maintain  or achieve ideal body weight, to reduce intake of dietary saturated fat and total fat, to limit sodium intake by avoiding high sodium foods and not adding table salt, and to maintain adequate dietary potassium and calcium preferably from fresh fruits, vegetables, and low-fat dairy products.    stressed the importance of regular exercise  Injury prevention: Discussed safety belts, safety helmets, smoke detector, smoking near bedding or upholstery.   Dental health: Discussed importance of regular tooth brushing, flossing, and dental visits.    NEXT PREVENTATIVE PHYSICAL DUE IN 1 YEAR. Return in about 6 months (around 03/10/2018) for Mood f/u.

## 2017-09-08 LAB — COMPREHENSIVE METABOLIC PANEL
A/G RATIO: 1.6 (ref 1.2–2.2)
ALBUMIN: 4.3 g/dL (ref 3.5–5.5)
ALT: 15 IU/L (ref 0–32)
AST: 18 IU/L (ref 0–40)
Alkaline Phosphatase: 43 IU/L (ref 39–117)
BUN/Creatinine Ratio: 17 (ref 9–23)
BUN: 13 mg/dL (ref 6–20)
Bilirubin Total: 0.5 mg/dL (ref 0.0–1.2)
CALCIUM: 9.2 mg/dL (ref 8.7–10.2)
CO2: 22 mmol/L (ref 20–29)
Chloride: 102 mmol/L (ref 96–106)
Creatinine, Ser: 0.78 mg/dL (ref 0.57–1.00)
GFR calc Af Amer: 112 mL/min/{1.73_m2} (ref >59)
GFR, EST NON AFRICAN AMERICAN: 97 mL/min/{1.73_m2} (ref >59)
GLOBULIN, TOTAL: 2.7 g/dL (ref 1.5–4.5)
Glucose: 88 mg/dL (ref 65–99)
POTASSIUM: 4.3 mmol/L (ref 3.5–5.2)
SODIUM: 137 mmol/L (ref 134–144)
TOTAL PROTEIN: 7 g/dL (ref 6.0–8.5)

## 2017-09-08 LAB — CBC WITH DIFFERENTIAL/PLATELET
BASOS: 1 %
Basophils Absolute: 0 10*3/uL (ref 0.0–0.2)
EOS (ABSOLUTE): 0.3 10*3/uL (ref 0.0–0.4)
EOS: 5 %
HEMATOCRIT: 36.9 % (ref 34.0–46.6)
HEMOGLOBIN: 12.7 g/dL (ref 11.1–15.9)
IMMATURE GRANS (ABS): 0 10*3/uL (ref 0.0–0.1)
IMMATURE GRANULOCYTES: 0 %
Lymphocytes Absolute: 1.8 10*3/uL (ref 0.7–3.1)
Lymphs: 29 %
MCH: 30.2 pg (ref 26.6–33.0)
MCHC: 34.4 g/dL (ref 31.5–35.7)
MCV: 88 fL (ref 79–97)
MONOS ABS: 0.4 10*3/uL (ref 0.1–0.9)
Monocytes: 6 %
NEUTROS PCT: 59 %
Neutrophils Absolute: 3.6 10*3/uL (ref 1.4–7.0)
Platelets: 227 10*3/uL (ref 150–379)
RBC: 4.2 x10E6/uL (ref 3.77–5.28)
RDW: 14.1 % (ref 12.3–15.4)
WBC: 6.1 10*3/uL (ref 3.4–10.8)

## 2017-09-08 LAB — LIPID PANEL W/O CHOL/HDL RATIO
Cholesterol, Total: 154 mg/dL (ref 100–199)
HDL: 57 mg/dL (ref >39)
LDL Calculated: 89 mg/dL (ref 0–99)
Triglycerides: 39 mg/dL (ref 0–149)
VLDL Cholesterol Cal: 8 mg/dL (ref 5–40)

## 2017-09-08 LAB — TSH: TSH: 1.94 u[IU]/mL (ref 0.450–4.500)

## 2017-09-09 NOTE — Assessment & Plan Note (Signed)
Stable and under good control with wellbutrin. Continue current regimen 

## 2017-09-09 NOTE — Patient Instructions (Signed)
Follow up in 6 months 

## 2018-01-16 ENCOUNTER — Ambulatory Visit: Payer: BLUE CROSS/BLUE SHIELD | Admitting: Family Medicine

## 2018-02-27 ENCOUNTER — Telehealth: Payer: Self-pay | Admitting: Family Medicine

## 2018-02-27 NOTE — Telephone Encounter (Unsigned)
Copied from [place] [id number]. Topic: Quick Communication - See Telephone Encounter >> Feb 27, 2018  9:46 AM Neva Seat wrote: Pt is requeting her buPROPion (WELLBUTRIN XL) 300 MG 24 hr tablet be incresed.  Pt states the current dosage is not helping. Pl

## 2018-02-28 NOTE — Telephone Encounter (Signed)
We can discuss this at her upcoming OV, she's already on a high dose so we may need to discuss a different medication

## 2018-02-28 NOTE — Telephone Encounter (Signed)
Explained to patient what Apolonio Schneiders advised. Patient has an appointment 03/15/2018. Patient became frustrated and stated that she didn't understand what the problem was because Apolonio Schneiders asked her last visit if she needed to go up on her medication.  I explained to patient again that Apolonio Schneiders stated you were on such and high dose and maybe change to a different therapy at the upcoming office visit. I offered patient a sooner appointment and patient stated "uhm, no thanks."  Explained to patient that we would see on the 6th, if she had any other questions or concerns to call and to have great evening.   Routing to provider as Juluis Rainier.

## 2018-03-05 ENCOUNTER — Telehealth: Payer: Self-pay | Admitting: Family Medicine

## 2018-03-05 ENCOUNTER — Other Ambulatory Visit: Payer: Self-pay

## 2018-03-05 MED ORDER — BUPROPION HCL ER (XL) 300 MG PO TB24: 300.0000 mg | ORAL_TABLET | Freq: Every day | ORAL | 0 refills | Status: DC

## 2018-03-05 NOTE — Telephone Encounter (Signed)
Copied from [place] [id number]. Topic: Quick Communication - Rx Refill/Question >> Mar 05, 2018 10:49 AM Synthia Innocent wrote: Medication: buPROPion (WELLBUTRIN XL) 300 MG 24 hr tablet, only has 1 pill left, will need enough to get through appt on 03/15/18  Has the patient contacted their pharmacy? Yes.   (Agent: If no, request that the patient contact the pharmacy for the refill.) (Agent: If yes, when and what did the pharmacy advise?)  Preferred Pharmacy (with phone number or street name): [facility] [address]: Please be advised that RX refills may take up to 3 business days. We ask that you follow-up with your pharmacy.

## 2018-03-15 ENCOUNTER — Ambulatory Visit: Payer: BLUE CROSS/BLUE SHIELD | Admitting: Family Medicine

## 2018-03-15 ENCOUNTER — Encounter: Payer: Self-pay | Admitting: Family Medicine

## 2018-03-15 VITALS — BP 105/70 | HR 74 | Temp 98.4°F | Ht 62.0 in | Wt 197.4 lb

## 2018-03-15 DIAGNOSIS — E669 Obesity, unspecified: Secondary | ICD-10-CM | POA: Insufficient documentation

## 2018-03-15 DIAGNOSIS — F330 Major depressive disorder, recurrent, mild: Secondary | ICD-10-CM

## 2018-03-15 MED ORDER — BUPROPION HCL ER (XL) 300 MG PO TB24: 300.0000 mg | ORAL_TABLET | Freq: Every day | ORAL | 1 refills | Status: DC

## 2018-03-15 MED ORDER — BUPROPION HCL ER (XL) 150 MG PO TB24: 150.0000 mg | ORAL_TABLET | Freq: Every day | ORAL | 1 refills | Status: DC

## 2018-03-15 NOTE — Progress Notes (Signed)
BP 105/70 (BP Location: Right Arm, Patient Position: Sitting, Cuff Size: Normal)   Pulse 74   Temp 98.4 F (36.9 C) (Oral)   Ht 5\' 2"  (1.575 m)   Wt 197 lb 6.4 oz (89.5 kg)   SpO2 99%   BMI 36.10 kg/m    Subjective:    Patient ID: Lynn Vargas, female    DOB: 06-Feb-1981, 37 y.o.   MRN: [id number]  HPI: Lynn Vargas is a 37 y.o. female  Chief Complaint  Patient presents with  . Depression  . Fatigue  . Anxiety   Here today for weight management and mood f/u. Has been taking 300 mg wellbutrin for over a year and has been able to lose 20 lb. Ran out of medication a few weeks ago and never went to pick up refill. Has since gained 5 lb back. Does intermittent fasting which seems to work. Wanting to increase dose and get back on it. Denies SI/HI.   Depression screen [facility] 2/9 03/15/2018 09/07/2017 02/05/2017  Decreased Interest 1 0 0  Down, Depressed, Hopeless 2 0 -  PHQ - 2 Score 3 0 0  Altered sleeping 0 3 -  Tired, decreased energy 3 1 -  Change in appetite 3 1 -  Feeling bad or failure about yourself  0 0 -  Trouble concentrating 0 0 -  Moving slowly or fidgety/restless 0 0 -  Suicidal thoughts 0 0 -  PHQ-9 Score 9 5 -    Relevant past medical, surgical, family and social history reviewed and updated as indicated. Interim medical history since our last visit reviewed. Allergies and medications reviewed and updated.  Review of Systems  Per HPI unless specifically indicated above     Objective:    BP 105/70 (BP Location: Right Arm, Patient Position: Sitting, Cuff Size: Normal)   Pulse 74   Temp 98.4 F (36.9 C) (Oral)   Ht 5\' 2"  (1.575 m)   Wt 197 lb 6.4 oz (89.5 kg)   SpO2 99%   BMI 36.10 kg/m   Wt Readings from Last 3 Encounters:  03/15/18 197 lb 6.4 oz (89.5 kg)  09/07/17 192 lb 3.2 oz (87.2 kg)  03/29/17 198 lb (89.8 kg)    Physical Exam  Constitutional: She is oriented to person, place, and time. She appears well-developed and well-nourished. No  distress.  HENT:  Head: Atraumatic.  Eyes: Conjunctivae and EOM are normal.  Neck: Normal range of motion. Neck supple.  Cardiovascular: Normal rate, regular rhythm and normal heart sounds.  Pulmonary/Chest: Effort normal and breath sounds normal.  Musculoskeletal: Normal range of motion.  Neurological: She is alert and oriented to person, place, and time.  Skin: Skin is warm and dry.  Psychiatric: She has a normal mood and affect. Her behavior is normal.  Nursing note and vitals reviewed.   Results for orders placed or performed in visit on 09/07/17  WET PREP FOR [place], YEAST, CLUE  Result Value Ref Range   Trichomonas Exam Negative Negative   Yeast Exam Negative Negative   Clue Cell Exam Negative Negative  Microscopic Examination  Result Value Ref Range   WBC, UA None seen 0 - 5 /hpf   RBC, UA 0-2 0 - 2 /hpf   Epithelial Cells (non renal) 0-10 0 - 10 /hpf   Bacteria, UA None seen None seen/Few  CBC with Differential/Platelet  Result Value Ref Range   WBC 6.1 3.4 - 10.8 x10E3/uL   RBC 4.20 3.77 - 5.28  x10E6/uL   Hemoglobin 12.7 11.1 - 15.9 g/dL   Hematocrit 36.9 34.0 - 46.6 %   MCV 88 79 - 97 fL   MCH 30.2 26.6 - 33.0 pg   MCHC 34.4 31.5 - 35.7 g/dL   RDW 14.1 12.3 - 15.4 %   Platelets 227 150 - 379 x10E3/uL   Neutrophils 59 Not Estab. %   Lymphs 29 Not Estab. %   Monocytes 6 Not Estab. %   Eos 5 Not Estab. %   Basos 1 Not Estab. %   Neutrophils Absolute 3.6 1.4 - 7.0 x10E3/uL   Lymphocytes Absolute 1.8 0.7 - 3.1 x10E3/uL   Monocytes Absolute 0.4 0.1 - 0.9 x10E3/uL   EOS (ABSOLUTE) 0.3 0.0 - 0.4 x10E3/uL   Basophils Absolute 0.0 0.0 - 0.2 x10E3/uL   Immature Granulocytes 0 Not Estab. %   Immature Grans (Abs) 0.0 0.0 - 0.1 x10E3/uL  Comprehensive metabolic panel  Result Value Ref Range   Glucose 88 65 - 99 mg/dL   BUN 13 6 - 20 mg/dL   Creatinine, Ser 0.78 0.57 - 1.00 mg/dL   GFR calc non Af Amer 97 >59 mL/min/1.73   GFR calc Af Amer 112 >59 mL/min/1.73    BUN/Creatinine Ratio 17 9 - 23   Sodium 137 134 - 144 mmol/L   Potassium 4.3 3.5 - 5.2 mmol/L   Chloride 102 96 - 106 mmol/L   CO2 22 20 - 29 mmol/L   Calcium 9.2 8.7 - 10.2 mg/dL   Total Protein 7.0 6.0 - 8.5 g/dL   Albumin 4.3 3.5 - 5.5 g/dL   Globulin, Total 2.7 1.5 - 4.5 g/dL   Albumin/Globulin Ratio 1.6 1.2 - 2.2   Bilirubin Total 0.5 0.0 - 1.2 mg/dL   Alkaline Phosphatase 43 39 - 117 IU/L   AST 18 0 - 40 IU/L   ALT 15 0 - 32 IU/L  Lipid Panel w/o Chol/HDL Ratio  Result Value Ref Range   Cholesterol, Total 154 100 - 199 mg/dL   Triglycerides 39 0 - 149 mg/dL   HDL 57 >39 mg/dL   VLDL Cholesterol Cal 8 5 - 40 mg/dL   LDL Calculated 89 0 - 99 mg/dL  TSH  Result Value Ref Range   TSH 1.940 0.450 - 4.500 uIU/mL  UA/M w/rflx Culture, Routine  Result Value Ref Range   Specific Gravity, UA 1.010 1.005 - 1.030   pH, UA 6.0 5.0 - 7.5   Color, UA Yellow Yellow   Appearance Ur Clear Clear   Leukocytes, UA Negative Negative   Protein, UA Negative Negative/Trace   Glucose, UA Negative Negative   Ketones, UA Negative Negative   RBC, UA Trace (A) Negative   Bilirubin, UA Negative Negative   Urobilinogen, Ur 0.2 0.2 - 1.0 mg/dL   Nitrite, UA Negative Negative   Microscopic Examination See below:       Assessment & Plan:   Problem List Items Addressed This Visit      Other   Depression - Primary    Restart wellbutrin, increase dose to 450 mg. Continue lifestyle modifications      Relevant Medications   buPROPion (WELLBUTRIN XL) 150 MG 24 hr tablet   buPROPion (WELLBUTRIN XL) 300 MG 24 hr tablet   Obesity (BMI 35.0-39.9 without comorbidity)    Restart wellbutrin, increase dose to 450 mg. Continue lifestyle modifications          Follow up plan: Return in about 6 months (around 09/13/2018) for CPE.

## 2018-03-18 NOTE — Assessment & Plan Note (Signed)
Restart wellbutrin, increase dose to 450 mg. Continue lifestyle modifications

## 2018-03-18 NOTE — Patient Instructions (Signed)
Follow up in 6 months 

## 2018-05-27 ENCOUNTER — Ambulatory Visit: Payer: BLUE CROSS/BLUE SHIELD | Admitting: Family Medicine

## 2018-05-27 ENCOUNTER — Encounter: Payer: Self-pay | Admitting: Family Medicine

## 2018-05-27 VITALS — BP 115/79 | HR 91 | Temp 99.3°F | Ht 62.0 in | Wt 199.8 lb

## 2018-05-27 DIAGNOSIS — N760 Acute vaginitis: Secondary | ICD-10-CM | POA: Diagnosis not present

## 2018-05-27 DIAGNOSIS — B379 Candidiasis, unspecified: Secondary | ICD-10-CM

## 2018-05-27 DIAGNOSIS — G44209 Tension-type headache, unspecified, not intractable: Secondary | ICD-10-CM

## 2018-05-27 DIAGNOSIS — N898 Other specified noninflammatory disorders of vagina: Secondary | ICD-10-CM | POA: Diagnosis not present

## 2018-05-27 DIAGNOSIS — B9689 Other specified bacterial agents as the cause of diseases classified elsewhere: Secondary | ICD-10-CM

## 2018-05-27 LAB — WET PREP FOR TRICH, YEAST, CLUE
CLUE CELL EXAM: POSITIVE — AB
TRICHOMONAS EXAM: NEGATIVE
YEAST EXAM: POSITIVE — AB

## 2018-05-27 MED ORDER — METRONIDAZOLE 500 MG PO TABS: 500.0000 mg | ORAL_TABLET | Freq: Two times a day (BID) | ORAL | 0 refills | Status: DC

## 2018-05-27 MED ORDER — FLUCONAZOLE 150 MG PO TABS: 150.0000 mg | ORAL_TABLET | Freq: Once | ORAL | 0 refills | Status: AC

## 2018-05-27 NOTE — Progress Notes (Signed)
BP 115/79   Pulse 91   Temp 99.3 F (37.4 C) (Oral)   Ht 5\' 2"  (1.575 m)   Wt 199 lb 12.8 oz (90.6 kg)   SpO2 98%   BMI 36.54 kg/m    Subjective:    Patient ID: Lynn Vargas, female    DOB: July 14, 1980, 37 y.o.   MRN: [id number]  HPI: Lynn Vargas is a 37 y.o. female  Chief Complaint  Patient presents with  . Vaginal Discharge    pt states she has been having vaginal discharge for about a month  . Headache    pt states she has been having off and on headaches for the past 3 months, states it was very bad today    VAGINAL DISCHARGE Duration: 1 month Discharge description: cottage cheese  Pruritus: no Dysuria: no Malodorous: yes Urinary frequency: yes Fevers: no Abdominal pain: yes  Sexual activity: practicing safe sex History of sexually transmitted diseases: no Recent antibiotic use: no Context: staying about the same  Treatments attempted: none  Headache Duration: today- has had 1 other headache in the past Onset: gradual Severity: severe Quality: dull and aching Frequency: has eased off right now Location: back of the head Headache duration: couple of hours Radiation: no Headache status at time of visit: current headache Treatments attempted: none   Aura: no Nausea:  no Vomiting: no Photophobia:  no Phonophobia:  yes Effect on social functioning:  yes Numbers of missed days of school/work each month: <1  Confusion:  no Gait disturbance/ataxia:  no Behavioral changes:  yes- had a "nervous break down" the last week in October, has been doing better Fevers:  no  Relevant past medical, surgical, family and social history reviewed and updated as indicated. Interim medical history since our last visit reviewed. Allergies and medications reviewed and updated.  Review of Systems  Constitutional: Negative.   Respiratory: Negative.   Cardiovascular: Negative.   Genitourinary: Positive for vaginal discharge. Negative for decreased urine volume,  difficulty urinating, dyspareunia, dysuria, enuresis, flank pain, frequency, genital sores, hematuria, menstrual problem, pelvic pain, urgency, vaginal bleeding and vaginal pain.  Neurological: Positive for headaches. Negative for dizziness, tremors, seizures, syncope, facial asymmetry, speech difficulty, weakness, light-headedness and numbness.  Hematological: Negative.   Psychiatric/Behavioral: Negative.     Per HPI unless specifically indicated above     Objective:    BP 115/79   Pulse 91   Temp 99.3 F (37.4 C) (Oral)   Ht 5\' 2"  (1.575 m)   Wt 199 lb 12.8 oz (90.6 kg)   SpO2 98%   BMI 36.54 kg/m   Wt Readings from Last 3 Encounters:  05/27/18 199 lb 12.8 oz (90.6 kg)  03/15/18 197 lb 6.4 oz (89.5 kg)  09/07/17 192 lb 3.2 oz (87.2 kg)    Physical Exam  Constitutional: She is oriented to person, place, and time. She appears well-developed and well-nourished. No distress.  HENT:  Head: Normocephalic and atraumatic.  Right Ear: Hearing normal.  Left Ear: Hearing normal.  Nose: Nose normal.  Eyes: Pupils are equal, round, and reactive to light. Conjunctivae, EOM and lids are normal. Right eye exhibits no discharge. Left eye exhibits no discharge. No scleral icterus.  Cardiovascular: Normal rate, regular rhythm, normal heart sounds and intact distal pulses. Exam reveals no gallop and no friction rub.  No murmur heard. Pulmonary/Chest: Effort normal and breath sounds normal. No stridor. No respiratory distress. She has no wheezes. She has no rales. She exhibits no tenderness.  Abdominal: Soft. Bowel sounds are normal. She exhibits no distension and no mass. There is no tenderness. There is no guarding.  Genitourinary: Vaginal discharge found.  Musculoskeletal: Normal range of motion. She exhibits no edema or tenderness.  Neurological: She is alert and oriented to person, place, and time.  Skin: Skin is warm, dry and intact. Capillary refill takes less than 2 seconds. No rash  noted. She is not diaphoretic. No cyanosis or erythema. No pallor.  Psychiatric: She has a normal mood and affect. Her speech is normal and behavior is normal. Judgment and thought content normal. Cognition and memory are normal.  Nursing note and vitals reviewed.   Results for orders placed or performed in visit on 05/27/18  WET PREP FOR [place], YEAST, CLUE  Result Value Ref Range   Trichomonas Exam Negative Negative   Yeast Exam Positive (A) Negative   Clue Cell Exam Positive (A) Negative      Assessment & Plan:   Problem List Items Addressed This Visit    None    Visit Diagnoses    BV (bacterial vaginosis)    -  Primary   Will treat with flagyl. Call with any concerns or if not getting better.    Relevant Medications   metroNIDAZOLE (FLAGYL) 500 MG tablet   fluconazole (DIFLUCAN) 150 MG tablet   Vaginal discharge       +Clue cells and yeast.   Relevant Orders   WET PREP FOR [place], YEAST, CLUE (Completed)   Acute non intractable tension-type headache       No sign of migraine. Offered toradol shot, which was declined. Will start stretches for her neck and work on stress. Call if not getting better or getting worse   Yeast infection       Will treat with diflucan. Call with any concerns or if not getting better.    Relevant Medications   metroNIDAZOLE (FLAGYL) 500 MG tablet   fluconazole (DIFLUCAN) 150 MG tablet       Follow up plan: Return if symptoms worsen or fail to improve.

## 2018-05-27 NOTE — Patient Instructions (Signed)
Tension Headache A tension headache is a feeling of pain, pressure, or aching that is often felt over the front and sides of the head. The pain can be dull, or it can feel tight (constricting). Tension headaches are not normally associated with nausea or vomiting, and they do not get worse with physical activity. Tension headaches can last from 30 minutes to several days. This is the most common type of headache. CAUSES The exact cause of this condition is not known. Tension headaches often begin after stress, anxiety, or depression. Other triggers may include:  Alcohol.  Too much caffeine, or caffeine withdrawal.  Respiratory infections, such as colds, flu, or sinus infections.  Dental problems or teeth clenching.  Fatigue.  Holding your head and neck in the same position for a long period of time, such as while using a computer.  Smoking. SYMPTOMS Symptoms of this condition include:  A feeling of pressure around the head.  Dull, aching head pain.  Pain felt over the front and sides of the head.  Tenderness in the muscles of the head, neck, and shoulders. DIAGNOSIS This condition may be diagnosed based on your symptoms and a physical exam. Tests may be done, such as a CT scan or an MRI of your head. These tests may be done if your symptoms are severe or unusual. TREATMENT This condition may be treated with lifestyle changes and medicines to help relieve symptoms. HOME CARE INSTRUCTIONS Managing Pain  Take over-the-counter and prescription medicines only as told by your health care provider.  Lie down in a dark, quiet room when you have a headache.  If directed, apply ice to the head and neck area: ? Put ice in a plastic bag. ? Place a towel between your skin and the bag. ? Leave the ice on for 20 minutes, 2-3 times per day.  Use a heating pad or a hot shower to apply heat to the head and neck area as told by your health care provider. Eating and Drinking  Eat meals on  a regular schedule.  Limit alcohol use.  Decrease your caffeine intake, or stop using caffeine. General Instructions  Keep all follow-up visits as told by your health care provider. This is important.  Keep a headache journal to help find out what may trigger your headaches. For example, write down: ? What you eat and drink. ? How much sleep you get. ? Any change to your diet or medicines.  Try massage or other relaxation techniques.  Limit stress.  Sit up straight, and avoid tensing your muscles.  Do not use tobacco products, including cigarettes, chewing tobacco, or e-cigarettes. If you need help quitting, ask your health care provider.  Exercise regularly as told by your health care provider.  Get 7-9 hours of sleep, or the amount recommended by your health care provider. SEEK MEDICAL CARE IF:  Your symptoms are not helped by medicine.  You have a headache that is different from what you normally experience.  You have nausea or you vomit.  You have a fever. SEEK IMMEDIATE MEDICAL CARE IF:  Your headache becomes severe.  You have repeated vomiting.  You have a stiff neck.  You have a loss of vision.  You have problems with speech.  You have pain in your eye or ear.  You have muscular weakness or loss of muscle control.  You lose your balance or you have trouble walking.  You feel faint or you pass out.  You have confusion. This information   is not intended to replace advice given to you by your health care provider. Make sure you discuss any questions you have with your health care provider. Document Released: 06/26/2005 Document Revised: 03/17/2015 Document Reviewed: 10/19/2014 Elsevier Interactive Patient Education  2017 [employer]  Neck Exercises Neck exercises can be important for many reasons:  They can help you to improve and maintain flexibility in your neck. This can be especially important as you age.  They can help to make your neck  stronger. This can make movement easier.  They can reduce or prevent neck pain.  They may help your upper back.  Ask your health care provider which neck exercises would be best for you. Exercises Neck Press Repeat this exercise 10 times. Do it first thing in the morning and right before bed or as told by your health care provider. 1. Lie on your back on a firm bed or on the floor with a pillow under your head. 2. Use your neck muscles to push your head down on the pillow and straighten your spine. 3. Hold the position as well as you can. Keep your head facing up and your chin tucked. 4. Slowly count to 5 while holding this position. 5. Relax for a few seconds. Then repeat.  Isometric Strengthening Do a full set of these exercises 2 times a day or as told by your health care provider. 1. Sit in a supportive chair and place your hand on your forehead. 2. Push forward with your head and neck while pushing back with your hand. Hold for 10 seconds. 3. Relax. Then repeat the exercise 3 times. 4. Next, do thesequence again, this time putting your hand against the back of your head. Use your head and neck to push backward against the hand pressure. 5. Finally, do the same exercise on either side of your head, pushing sideways against the pressure of your hand.  Prone Head Lifts Repeat this exercise 5 times. Do this 2 times a day or as told by your health care provider. 1. Lie face-down, resting on your elbows so that your chest and upper back are raised. 2. Start with your head facing downward, near your chest. Position your chin either on or near your chest. 3. Slowly lift your head upward. Lift until you are looking straight ahead. Then continue lifting your head as far back as you can stretch. 4. Hold your head up for 5 seconds. Then slowly lower it to your starting position.  Supine Head Lifts Repeat this exercise 8-10 times. Do this 2 times a day or as told by your health care  provider. 1. Lie on your back, bending your knees to point to the ceiling and keeping your feet flat on the floor. 2. Lift your head slowly off the floor, raising your chin toward your chest. 3. Hold for 5 seconds. 4. Relax and repeat.  Scapular Retraction Repeat this exercise 5 times. Do this 2 times a day or as told by your health care provider. 1. Stand with your arms at your sides. Look straight ahead. 2. Slowly pull both shoulders backward and downward until you feel a stretch between your shoulder blades in your upper back. 3. Hold for 10-30 seconds. 4. Relax and repeat.  Contact a health care provider if:  Your neck pain or discomfort gets much worse when you do an exercise.  Your neck pain or discomfort does not improve within 2 hours after you exercise. If you have any of these problems,  stop exercising right away. Do not do the exercises again unless your health care provider says that you can. Get help right away if:  You develop sudden, severe neck pain. If this happens, stop exercising right away. Do not do the exercises again unless your health care provider says that you can. Exercises Neck Stretch  Repeat this exercise 3-5 times. 1. Do this exercise while standing or while sitting in a chair. 2. Place your feet flat on the floor, shoulder-width apart. 3. Slowly turn your head to the right. Turn it all the way to the right so you can look over your right shoulder. Do not tilt or tip your head. 4. Hold this position for 10-30 seconds. 5. Slowly turn your head to the left, to look over your left shoulder. 6. Hold this position for 10-30 seconds.  Neck Retraction Repeat this exercise 8-10 times. Do this 3-4 times a day or as told by your health care provider. 1. Do this exercise while standing or while sitting in a sturdy chair. 2. Look straight ahead. Do not bend your neck. 3. Use your fingers to push your chin backward. Do not bend your neck for this movement.  Continue to face straight ahead. If you are doing the exercise properly, you will feel a slight sensation in your throat and a stretch at the back of your neck. 4. Hold the stretch for 1-2 seconds. Relax and repeat.  This information is not intended to replace advice given to you by your health care provider. Make sure you discuss any questions you have with your health care provider. Document Released: 06/07/2015 Document Revised: 12/02/2015 Document Reviewed: 01/04/2015 Elsevier Interactive Patient Education  [employer].

## 2018-07-15 ENCOUNTER — Telehealth: Payer: Self-pay | Admitting: Family Medicine

## 2018-07-15 ENCOUNTER — Ambulatory Visit: Payer: Self-pay

## 2018-07-15 NOTE — Telephone Encounter (Signed)
See triage notes

## 2018-07-15 NOTE — Telephone Encounter (Signed)
Patient called in with c/o "genital herpes." She says "I have a flare up that started 2 days ago and I take Valtrex for it, but I don't have any more and I threw the bottle away. I have pain at a 9 with no other symptoms." According to protocol, see PCP within 24 hours, she says "I don't usually come in. They will send it in. Just have her to send it in for me. I really need it." I asked for the dosage on the bottle, she says she threw it away, because it's been so long she's had a flare up. I advised I will send this high priority for refill. I called [facility] and spoke to [place], [facility] who says the Valtrex 500 mg written to take BID was last prescribed in 05/2017 #6 pills.   Reason for Disposition . [1] Rash (e.g., redness, tiny bumps, sore) of genital area AND [2] present > 24 hours  Answer Assessment - Initial Assessment Questions 1. SYMPTOM: "What's the main symptom you're concerned about?" (e.g., rash, itching, swelling, dryness)     Rash 2. LOCATION: "Where is the outside located?" (e.g., inside/outside, left/right)     Rash 3. ONSET: "When did the rash start?"     2 days ago 4. PAIN: "Is there any pain?" If so, ask: "How bad is it?" (Scale: 1-10; mild, moderate, severe)   -  MILD (1-3): doesn't interfere with normal activities    -  MODERATE (4-7): interferes with normal activities (e.g., work or school) or awakens from sleep     -  SEVERE (8-10): excruciating pain, unable to do any normal activities     9 5. CAUSE: "What do you think is causing the symptoms?"     Herpes flare up 6. OTHER SYMPTOMS: "Do you have any other symptoms?" (e.g., fever, vaginal bleeding, pain with urination)     No 7. PREGNANCY: "Is there any chance you are pregnant?" "When was your last menstrual period?"     No  Protocols used: [facility]

## 2018-07-15 NOTE — Telephone Encounter (Signed)
Patient called, left VM to return call to the office. Will need to know why Valtrex is needed. This med is not on the recent profile or history medication profile.

## 2018-07-15 NOTE — Telephone Encounter (Signed)
Patient called, left VM to return call to the office. 

## 2018-07-15 NOTE — Telephone Encounter (Signed)
Copied from [place] [id number]. Topic: Quick Communication - Rx Refill/Question >> Jul 15, 2018  8:42 AM Judyann Munson wrote: Medication: Valtrex   Has the patient contacted their pharmacy? No  Preferred Pharmacy (with phone number or street name): [id number] [address], [place] [phone] (Phone) [phone] (Fax)    Agent: Please be advised that RX refills may take up to 3 business days. We ask that you follow-up with your pharmacy.

## 2018-07-16 MED ORDER — VALACYCLOVIR HCL 1 G PO TABS: 1000.0000 mg | ORAL_TABLET | Freq: Two times a day (BID) | ORAL | 1 refills | Status: DC

## 2018-07-16 NOTE — Telephone Encounter (Signed)
RX sent

## 2018-07-18 ENCOUNTER — Ambulatory Visit: Payer: Self-pay | Admitting: Family Medicine

## 2018-07-31 ENCOUNTER — Encounter: Payer: Self-pay | Admitting: Family Medicine

## 2018-07-31 ENCOUNTER — Ambulatory Visit: Payer: BLUE CROSS/BLUE SHIELD | Admitting: Family Medicine

## 2018-07-31 VITALS — BP 122/84 | HR 71 | Temp 98.8°F | Ht 62.0 in | Wt 203.8 lb

## 2018-07-31 DIAGNOSIS — F419 Anxiety disorder, unspecified: Secondary | ICD-10-CM | POA: Diagnosis not present

## 2018-07-31 DIAGNOSIS — F330 Major depressive disorder, recurrent, mild: Secondary | ICD-10-CM | POA: Diagnosis not present

## 2018-07-31 DIAGNOSIS — E6609 Other obesity due to excess calories: Secondary | ICD-10-CM | POA: Diagnosis not present

## 2018-07-31 DIAGNOSIS — R102 Pelvic and perineal pain: Secondary | ICD-10-CM | POA: Diagnosis not present

## 2018-07-31 DIAGNOSIS — Z6837 Body mass index (BMI) 37.0-37.9, adult: Secondary | ICD-10-CM

## 2018-07-31 MED ORDER — BUSPIRONE HCL 7.5 MG PO TABS: 7.5000 mg | ORAL_TABLET | Freq: Three times a day (TID) | ORAL | 1 refills | Status: DC

## 2018-07-31 NOTE — Progress Notes (Addendum)
BP 122/84   Pulse 71   Temp 98.8 F (37.1 C) (Oral)   Ht 5\' 2"  (1.575 m)   Wt 203 lb 12.8 oz (92.4 kg)   SpO2 97%   BMI 37.28 kg/m    Subjective:    Patient ID: Lynn Vargas, female    DOB: May 09, 1981, 38 y.o.   MRN: [id number]  HPI: Lynn Vargas is a 38 y.o. female  Chief Complaint  Patient presents with  . Depression  . Weight Check   Here today for 6 month f/u.   Anxiety is worse than ever. Feels like she got overworked during the holidays and has not recovered. Does still feel a benefit with the wellbutrin but possibly needing more. Thinks her weight gain is worsening things as well. Denies SI/HI.   Not working out anymore the past 3 months. Still eating fairly healthy and trying to do intermittent fasting. Finding it hard to find the time to be active with her increased work schedule.   Having persistent right sided pelvic cramping that feels like when she has had ovarian cysts in the past. 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 Not currently followed by GYN. Denies abnormal bleeding, weight loss, fatigue.   Depression screen [facility] 2/9 07/31/2018 03/15/2018 09/07/2017  Decreased Interest 3 1 0  Down, Depressed, Hopeless 3 2 0  PHQ - 2 Score 6 3 0  Altered sleeping 1 0 3  Tired, decreased energy 2 3 1   Change in appetite 3 3 1   Feeling bad or failure about yourself  0 0 0  Trouble concentrating 1 0 0  Moving slowly or fidgety/restless 0 0 0  Suicidal thoughts 0 0 0  PHQ-9 Score 13 9 5    GAD 7 : Generalized Anxiety Score 07/31/2018 03/15/2018  Nervous, Anxious, on Edge 1 3  Control/stop worrying 3 0  Worry too much - different things 2 2  Trouble relaxing 3 1  Restless 1 0  Easily annoyed or irritable 3 3  Afraid - awful might happen 0 0  Total GAD 7 Score 13 9  Anxiety Difficulty - Not difficult at all    Relevant past medical, surgical, family and social history reviewed and updated as indicated.  Interim medical history since our last visit reviewed. Allergies and medications reviewed and updated.  Review of Systems  Per HPI unless specifically indicated above     Objective:    BP 122/84   Pulse 71   Temp 98.8 F (37.1 C) (Oral)   Ht 5\' 2"  (1.575 m)   Wt 203 lb 12.8 oz (92.4 kg)   SpO2 97%   BMI 37.28 kg/m   Wt Readings from Last 3 Encounters:  07/31/18 203 lb 12.8 oz (92.4 kg)  05/27/18 199 lb 12.8 oz (90.6 kg)  03/15/18 197 lb 6.4 oz (89.5 kg)    Physical Exam Vitals signs and nursing note reviewed.  Constitutional:      Appearance: Normal appearance. She is obese. She is not ill-appearing.  HENT:     Head: Atraumatic.  Eyes:     Extraocular Movements: Extraocular movements intact.     Conjunctiva/sclera: Conjunctivae normal.  Neck:     Musculoskeletal: Normal range of motion and neck supple.  Cardiovascular:     Rate and Rhythm: Normal rate and regular rhythm.     Heart sounds: Normal heart sounds.  Pulmonary:     Effort: Pulmonary effort  is normal.     Breath sounds: Normal breath sounds.  Abdominal:     General: There is no distension.     Palpations: There is no mass.     Tenderness: There is no abdominal tenderness. There is no right CVA tenderness, left CVA tenderness or guarding.  Musculoskeletal: Normal range of motion.  Skin:    General: Skin is warm and dry.  Neurological:     Mental Status: She is alert and oriented to person, place, and time.  Psychiatric:        Mood and Affect: Mood normal.        Thought Content: Thought content normal.        Judgment: Judgment normal.     Results for orders placed or performed in visit on 05/27/18  WET PREP FOR [place], YEAST, CLUE  Result Value Ref Range   Trichomonas Exam Negative Negative   Yeast Exam Positive (A) Negative   Clue Cell Exam Positive (A) Negative      Assessment & Plan:   Problem List Items Addressed This Visit      Other   Depression    Not under good control, add  buspar and monitor for benefit      Relevant Medications   busPIRone (BUSPAR) 7.5 MG tablet   Anxiety - Primary    Not under good control, will add buspar and recheck in 1 month. Discussed meditation, counseling etc      Relevant Medications   busPIRone (BUSPAR) 7.5 MG tablet   Obesity    Work on lifestyle modifications and getting back on track with stress/anxiety levels. Continue to monitor. Discussed weight loss medications, pt wanting to hold off for now       Other Visit Diagnoses    Pelvic cramping       U/s ordered, follow up with GYN if worsening sxs or abnormal imaging. Declines further workup today   Relevant Orders   US Pelvic Complete With Transvaginal       Follow up plan: Return in about 6 weeks (around 09/11/2018) for Anxiety.

## 2018-08-04 DIAGNOSIS — E669 Obesity, unspecified: Secondary | ICD-10-CM | POA: Insufficient documentation

## 2018-08-04 NOTE — Assessment & Plan Note (Signed)
Not under good control, will add buspar and recheck in 1 month. Discussed meditation, counseling etc

## 2018-08-04 NOTE — Assessment & Plan Note (Signed)
Not under good control, add buspar and monitor for benefit

## 2018-08-04 NOTE — Assessment & Plan Note (Signed)
Work on lifestyle modifications and getting back on track with stress/anxiety levels. Continue to monitor. Discussed weight loss medications, pt wanting to hold off for now

## 2018-08-09 NOTE — Addendum Note (Signed)
Addended by: Merrie Roof E on: 08/09/2018 11:58 AM   Modules accepted: Orders

## 2018-08-16 ENCOUNTER — Ambulatory Visit: Payer: BLUE CROSS/BLUE SHIELD

## 2018-08-19 ENCOUNTER — Other Ambulatory Visit: Payer: Self-pay | Admitting: Family Medicine

## 2018-08-19 ENCOUNTER — Ambulatory Visit
Admission: RE | Admit: 2018-08-19 | Discharge: 2018-08-19 | Disposition: A | Discharge status: Home / Self Care | Payer: BLUE CROSS/BLUE SHIELD | Source: Ambulatory Visit | Attending: Family Medicine | Admitting: Family Medicine

## 2018-08-19 ENCOUNTER — Encounter: Payer: Self-pay | Admitting: Family Medicine

## 2018-08-19 DIAGNOSIS — R102 Pelvic and perineal pain: Secondary | ICD-10-CM | POA: Diagnosis present

## 2018-08-20 ENCOUNTER — Other Ambulatory Visit: Payer: Self-pay | Admitting: Family Medicine

## 2018-08-20 MED ORDER — VALACYCLOVIR HCL 1 G PO TABS: 1000.0000 mg | ORAL_TABLET | Freq: Two times a day (BID) | ORAL | 1 refills | Status: DC

## 2018-09-11 ENCOUNTER — Ambulatory Visit: Payer: BLUE CROSS/BLUE SHIELD | Admitting: Family Medicine

## 2018-09-16 ENCOUNTER — Ambulatory Visit (INDEPENDENT_AMBULATORY_CARE_PROVIDER_SITE_OTHER): Payer: BLUE CROSS/BLUE SHIELD | Admitting: Family Medicine

## 2018-09-16 ENCOUNTER — Encounter: Payer: Self-pay | Admitting: Family Medicine

## 2018-09-16 VITALS — BP 106/72 | HR 84 | Temp 98.7°F | Ht 63.5 in | Wt 213.4 lb

## 2018-09-16 DIAGNOSIS — E669 Obesity, unspecified: Secondary | ICD-10-CM | POA: Diagnosis not present

## 2018-09-16 DIAGNOSIS — F330 Major depressive disorder, recurrent, mild: Secondary | ICD-10-CM

## 2018-09-16 MED ORDER — BUSPIRONE HCL 10 MG PO TABS: 10.0000 mg | ORAL_TABLET | Freq: Every day | ORAL | 1 refills | Status: DC

## 2018-09-16 NOTE — Progress Notes (Signed)
BP 106/72 (BP Location: Right Arm, Patient Position: Sitting, Cuff Size: Large)   Pulse 84   Temp 98.7 F (37.1 C) (Oral)   Ht 5' 3.5" (1.613 m)   Wt 213 lb 6.4 oz (96.8 kg)   SpO2 97%   BMI 37.21 kg/m    Subjective:    Patient ID: Lynn Vargas, female    DOB: 1981-05-18, 38 y.o.   MRN: [id number]  HPI: KEISHAWNA CARRANZA is a 38 y.o. female  Chief Complaint  Patient presents with  . Anxiety    No Complaints  . Depression   Here today for anxiety and depression f/u. Has been on wellbutrin for about 2 years now which previously worked well but lately has not seemed to be enough. Buspar added at last visit, states it makes her sleepy so she takes it once at bedtime daily. Sleeping well, feeling happier than ever. Denies SI/HI, side effects aside from sedation. Stopped the wellbutrin weeks ago because she feels so good on the buspar.   Depression screen [facility] 2/9 07/31/2018 03/15/2018 09/07/2017  Decreased Interest 3 1 0  Down, Depressed, Hopeless 3 2 0  PHQ - 2 Score 6 3 0  Altered sleeping 1 0 3  Tired, decreased energy 2 3 1   Change in appetite 3 3 1   Feeling bad or failure about yourself  0 0 0  Trouble concentrating 1 0 0  Moving slowly or fidgety/restless 0 0 0  Suicidal thoughts 0 0 0  PHQ-9 Score 13 9 5    Relevant past medical, surgical, family and social history reviewed and updated as indicated. Interim medical history since our last visit reviewed. Allergies and medications reviewed and updated.  Review of Systems  Per HPI unless specifically indicated above     Objective:    BP 106/72 (BP Location: Right Arm, Patient Position: Sitting, Cuff Size: Large)   Pulse 84   Temp 98.7 F (37.1 C) (Oral)   Ht 5' 3.5" (1.613 m)   Wt 213 lb 6.4 oz (96.8 kg)   SpO2 97%   BMI 37.21 kg/m   Wt Readings from Last 3 Encounters:  09/16/18 213 lb 6.4 oz (96.8 kg)  07/31/18 203 lb 12.8 oz (92.4 kg)  05/27/18 199 lb 12.8 oz (90.6 kg)    Physical Exam Vitals signs and  nursing note reviewed.  Constitutional:      Appearance: Normal appearance. She is not ill-appearing.  HENT:     Head: Atraumatic.  Eyes:     Extraocular Movements: Extraocular movements intact.     Conjunctiva/sclera: Conjunctivae normal.  Neck:     Musculoskeletal: Normal range of motion and neck supple.  Cardiovascular:     Rate and Rhythm: Normal rate and regular rhythm.     Heart sounds: Normal heart sounds.  Pulmonary:     Effort: Pulmonary effort is normal.     Breath sounds: Normal breath sounds.  Musculoskeletal: Normal range of motion.  Skin:    General: Skin is warm and dry.  Neurological:     Mental Status: She is alert and oriented to person, place, and time.  Psychiatric:        Mood and Affect: Mood normal.        Thought Content: Thought content normal.        Judgment: Judgment normal.     Results for orders placed or performed in visit on 05/27/18  WET PREP FOR [place], YEAST, CLUE  Result Value Ref Range  Trichomonas Exam Negative Negative   Yeast Exam Positive (A) Negative   Clue Cell Exam Positive (A) Negative      Assessment & Plan:   Problem List Items Addressed This Visit      Other   Depression - Primary    Significant improvement on once daily buspar. Will increase to 10 mg nightly and continue to monitor      Relevant Medications   busPIRone (BUSPAR) 10 MG tablet   Obesity (BMI 35.0-39.9 without comorbidity)    Had lost almost 15 lb with a new ensure diet she was using but has gained back since coming off the diet. Discussed getting back on track with cutting back on sweets and increasing exercise          Follow up plan: Return in about 6 months (around 03/19/2019) for Mood f/u.

## 2018-09-16 NOTE — Assessment & Plan Note (Signed)
Significant improvement on once daily buspar. Will increase to 10 mg nightly and continue to monitor

## 2018-09-16 NOTE — Assessment & Plan Note (Signed)
Had lost almost 15 lb with a new ensure diet she was using but has gained back since coming off the diet. Discussed getting back on track with cutting back on sweets and increasing exercise

## 2018-10-07 ENCOUNTER — Telehealth: Payer: BLUE CROSS/BLUE SHIELD | Admitting: Family

## 2018-10-07 DIAGNOSIS — N760 Acute vaginitis: Secondary | ICD-10-CM

## 2018-10-07 DIAGNOSIS — B9689 Other specified bacterial agents as the cause of diseases classified elsewhere: Secondary | ICD-10-CM

## 2018-10-07 MED ORDER — METRONIDAZOLE 500 MG PO TABS: 500.0000 mg | ORAL_TABLET | Freq: Two times a day (BID) | ORAL | 0 refills | Status: DC

## 2018-10-07 NOTE — Addendum Note (Signed)
Addended by: Evelina Dun A on: 10/07/2018 04:54 PM   Modules accepted: Orders

## 2018-10-07 NOTE — Progress Notes (Signed)

## 2018-10-28 ENCOUNTER — Encounter: Payer: Self-pay | Admitting: Family Medicine

## 2018-10-31 ENCOUNTER — Ambulatory Visit: Payer: BLUE CROSS/BLUE SHIELD | Admitting: Family Medicine

## 2018-10-31 ENCOUNTER — Other Ambulatory Visit: Payer: Self-pay

## 2018-10-31 ENCOUNTER — Encounter: Payer: Self-pay | Admitting: Family Medicine

## 2018-10-31 VITALS — BP 118/84 | HR 81 | Temp 99.0°F | Ht 63.5 in | Wt 213.0 lb

## 2018-10-31 DIAGNOSIS — N760 Acute vaginitis: Secondary | ICD-10-CM

## 2018-10-31 DIAGNOSIS — F330 Major depressive disorder, recurrent, mild: Secondary | ICD-10-CM

## 2018-10-31 DIAGNOSIS — E669 Obesity, unspecified: Secondary | ICD-10-CM

## 2018-10-31 DIAGNOSIS — N898 Other specified noninflammatory disorders of vagina: Secondary | ICD-10-CM | POA: Diagnosis not present

## 2018-10-31 DIAGNOSIS — B9689 Other specified bacterial agents as the cause of diseases classified elsewhere: Secondary | ICD-10-CM

## 2018-10-31 LAB — UA/M W/RFLX CULTURE, ROUTINE
Bilirubin, UA: NEGATIVE
Glucose, UA: NEGATIVE
Ketones, UA: NEGATIVE
Leukocytes,UA: NEGATIVE
Nitrite, UA: NEGATIVE
Protein,UA: NEGATIVE
Specific Gravity, UA: 1.02 (ref 1.005–1.030)
Urobilinogen, Ur: 1 mg/dL (ref 0.2–1.0)
pH, UA: 6.5 (ref 5.0–7.5)

## 2018-10-31 LAB — MICROSCOPIC EXAMINATION
Bacteria, UA: NONE SEEN
WBC, UA: NONE SEEN /hpf (ref 0–5)

## 2018-10-31 LAB — WET PREP FOR TRICH, YEAST, CLUE
Clue Cell Exam: POSITIVE — AB
Trichomonas Exam: NEGATIVE
Yeast Exam: NEGATIVE

## 2018-10-31 MED ORDER — METRONIDAZOLE 0.75 % VA GEL: 1.0000 | Freq: Two times a day (BID) | VAGINAL | 3 refills | Status: DC

## 2018-10-31 MED ORDER — BUSPIRONE HCL 10 MG PO TABS: 10.0000 mg | ORAL_TABLET | Freq: Every day | ORAL | 1 refills | Status: DC

## 2018-10-31 NOTE — Patient Instructions (Addendum)
Boric acid suppositories  Probiotics 1-2 times daily  Hyaluronic acid for dryness

## 2018-10-31 NOTE — Assessment & Plan Note (Signed)
Continue wellbutrin, work on lifestyle modifications. F/u in 6 moths for recheck

## 2018-10-31 NOTE — Assessment & Plan Note (Signed)
Continue buspar and wellbutrin, follow up if continuing to struggle with side effects

## 2018-10-31 NOTE — Progress Notes (Signed)
BP 118/84   Pulse 81   Temp 99 F (37.2 C) (Oral)   Ht 5' 3.5" (1.613 m)   Wt 213 lb (96.6 kg)   SpO2 98%   BMI 37.14 kg/m    Subjective:    Patient ID: Lynn Vargas, female    DOB: 05/30/1981, 38 y.o.   MRN: [id number]  HPI: [name] is a 38 y.o. female  Chief Complaint  Patient presents with  . Vaginitis    Vaginal odor. Took Flagyl that was prescribed at virtual visit.   . Medication Management    Pt would like to know if provider can refill hydroquineone 4% cream. She was given by another provider but has misplaced it. Would like printed RXs today   Here today for ongoing vaginal odor and discharge. Just finished a course of flagyl about 2 weeks ago which didn't seem to clear things up. Gets frequent BV infections. Some right sided Abdominal cramping as well. Denies fevers, chills, dysuria, hematuria. No known exposure to STIs but does want to be checked to be sure. No rashes but does have a small raised bump externally in groin area she would like checked as well that was first noticed today.   Started back on her buspar and wellbutrin as she had a 15 lb weight gain and worsened moods after coming off of them. Feeling better already but dealing with vaginal dryness which has always been a side effect of it for her. Not trying anything OTC for this. Does not want to come off the wellbutrin as it helps her so much. Denies SI/HI.   Depression screen [facility] 2/9 07/31/2018 03/15/2018 09/07/2017  Decreased Interest 3 1 0  Down, Depressed, Hopeless 3 2 0  PHQ - 2 Score 6 3 0  Altered sleeping 1 0 3  Tired, decreased energy 2 3 1   Change in appetite 3 3 1   Feeling bad or failure about yourself  0 0 0  Trouble concentrating 1 0 0  Moving slowly or fidgety/restless 0 0 0  Suicidal thoughts 0 0 0  PHQ-9 Score 13 9 5     Relevant past medical, surgical, family and social history reviewed and updated as indicated. Interim medical history since our last visit reviewed. Allergies  and medications reviewed and updated.  Review of Systems  Per HPI unless specifically indicated above     Objective:    BP 118/84   Pulse 81   Temp 99 F (37.2 C) (Oral)   Ht 5' 3.5" (1.613 m)   Wt 213 lb (96.6 kg)   SpO2 98%   BMI 37.14 kg/m   Wt Readings from Last 3 Encounters:  10/31/18 213 lb (96.6 kg)  09/16/18 213 lb 6.4 oz (96.8 kg)  07/31/18 203 lb 12.8 oz (92.4 kg)    Physical Exam Vitals signs and nursing note reviewed.  Constitutional:      Appearance: Normal appearance. She is not ill-appearing.  HENT:     Head: Atraumatic.  Eyes:     Extraocular Movements: Extraocular movements intact.     Conjunctiva/sclera: Conjunctivae normal.  Neck:     Musculoskeletal: Normal range of motion and neck supple.  Cardiovascular:     Rate and Rhythm: Normal rate and regular rhythm.     Heart sounds: Normal heart sounds.  Pulmonary:     Effort: Pulmonary effort is normal.     Breath sounds: Normal breath sounds.  Abdominal:     General: Bowel sounds are normal.  There is no distension.     Palpations: Abdomen is soft.     Tenderness: There is no abdominal tenderness. There is no right CVA tenderness or left CVA tenderness.  Genitourinary:    Vagina: Vaginal discharge present.  Musculoskeletal: Normal range of motion.  Skin:    General: Skin is warm and dry.     Comments: Healing folliculitis bump center of mons pubis. No redness or drainage present  Neurological:     Mental Status: She is alert and oriented to person, place, and time.  Psychiatric:        Mood and Affect: Mood normal.        Thought Content: Thought content normal.        Judgment: Judgment normal.     Results for orders placed or performed in visit on 05/27/18  WET PREP FOR [place], YEAST, CLUE  Result Value Ref Range   Trichomonas Exam Negative Negative   Yeast Exam Positive (A) Negative   Clue Cell Exam Positive (A) Negative      Assessment & Plan:   Problem List Items Addressed This  Visit      Other   Depression    Continue buspar and wellbutrin, follow up if continuing to struggle with side effects      Relevant Medications   buPROPion (WELLBUTRIN XL) 300 MG 24 hr tablet   busPIRone (BUSPAR) 10 MG tablet   Obesity (BMI 35.0-39.9 without comorbidity)    Continue wellbutrin, work on lifestyle modifications. F/u in 6 moths for recheck       Other Visit Diagnoses    BV (bacterial vaginosis)    -  Primary   Failed multiple courses of PO flagyl the past year. Vaginal metronidazole sent, should take for at least 3 weeks. Probiotics, boric acid recommended   Relevant Orders   HIV Antibody (routine testing w rflx)   RPR   UA/M w/rflx Culture, Routine   GC/Chlamydia Probe Amp   Vaginal odor       U/A benign, wet prep + for BV. Await STI testing for r/o. Tx BV   Relevant Orders   WET PREP FOR [place], YEAST, CLUE       Follow up plan: Return in about 6 months (around 05/02/2019) for CPE.

## 2018-11-01 LAB — RPR: RPR Ser Ql: NONREACTIVE

## 2018-11-01 LAB — HIV ANTIBODY (ROUTINE TESTING W REFLEX): HIV Screen 4th Generation wRfx: NONREACTIVE

## 2018-11-02 LAB — GC/CHLAMYDIA PROBE AMP
Chlamydia trachomatis, NAA: NEGATIVE
Neisseria Gonorrhoeae by PCR: NEGATIVE

## 2018-11-05 ENCOUNTER — Other Ambulatory Visit (HOSPITAL_COMMUNITY): Payer: Self-pay | Admitting: Obstetrics & Gynecology

## 2018-11-05 ENCOUNTER — Other Ambulatory Visit: Payer: Self-pay | Admitting: Obstetrics & Gynecology

## 2018-11-05 DIAGNOSIS — R102 Pelvic and perineal pain: Secondary | ICD-10-CM

## 2018-12-11 ENCOUNTER — Other Ambulatory Visit: Payer: Self-pay

## 2018-12-11 ENCOUNTER — Ambulatory Visit (HOSPITAL_COMMUNITY)
Admission: RE | Admit: 2018-12-11 | Discharge: 2018-12-11 | Disposition: A | Discharge status: Home / Self Care | Payer: BC Managed Care – PPO | Source: Ambulatory Visit | Attending: Obstetrics & Gynecology | Admitting: Obstetrics & Gynecology

## 2018-12-11 DIAGNOSIS — R102 Pelvic and perineal pain: Secondary | ICD-10-CM | POA: Diagnosis not present

## 2018-12-11 MED ORDER — IOHEXOL 300 MG/ML  SOLN
100.0000 mL | Freq: Once | INTRAMUSCULAR | Status: AC | PRN
  Administered 2018-12-11: 100 mL via INTRAVENOUS

## 2019-02-04 ENCOUNTER — Encounter: Payer: Self-pay | Admitting: Family Medicine

## 2019-02-04 ENCOUNTER — Ambulatory Visit: Payer: BC Managed Care – PPO | Admitting: Family Medicine

## 2019-02-04 ENCOUNTER — Other Ambulatory Visit: Payer: Self-pay

## 2019-02-04 VITALS — BP 110/77 | HR 75 | Temp 98.4°F | Ht 63.5 in | Wt 201.0 lb

## 2019-02-04 DIAGNOSIS — K130 Diseases of lips: Secondary | ICD-10-CM | POA: Diagnosis not present

## 2019-02-04 MED ORDER — MUPIROCIN 2 % EX OINT: 1.0000 "application " | TOPICAL_OINTMENT | Freq: Two times a day (BID) | CUTANEOUS | 0 refills | Status: DC

## 2019-02-04 NOTE — Progress Notes (Signed)
BP 110/77   Pulse 75   Temp 98.4 F (36.9 C) (Oral)   Ht 5' 3.5" (1.613 m)   Wt 201 lb (91.2 kg)   SpO2 99%   BMI 35.05 kg/m    Subjective:    Patient ID: Lynn Vargas, female    DOB: 1981/02/23, 38 y.o.   MRN: [id number]  HPI: Lynn Vargas is a 38 y.o. female  Chief Complaint  Patient presents with  . Facial Swelling    lisp. since yesterday after she put a bandaid on. states that rash and irritation started ever since she started using  the mask   SKIN LESION- popped a pimple and had blood coming out, was keeping neosporin on it and had a band aide on it. Now a lump, concerned it's HSV1 Duration: 1 day Location: lip Painful: yes Itching: no Onset: sudden Context: smaller History of skin cancer: no History of precancerous skin lesions: no Family history of skin cancer: no  Relevant past medical, surgical, family and social history reviewed and updated as indicated. Interim medical history since our last visit reviewed. Allergies and medications reviewed and updated.  Review of Systems  Constitutional: Negative.   Respiratory: Negative.   Cardiovascular: Negative.   Skin: Positive for wound. Negative for color change, pallor and rash.  Psychiatric/Behavioral: Negative.     Per HPI unless specifically indicated above     Objective:    BP 110/77   Pulse 75   Temp 98.4 F (36.9 C) (Oral)   Ht 5' 3.5" (1.613 m)   Wt 201 lb (91.2 kg)   SpO2 99%   BMI 35.05 kg/m   Wt Readings from Last 3 Encounters:  02/04/19 201 lb (91.2 kg)  10/31/18 213 lb (96.6 kg)  09/16/18 213 lb 6.4 oz (96.8 kg)    Physical Exam Vitals signs and nursing note reviewed.  Constitutional:      General: She is not in acute distress.    Appearance: Normal appearance. She is not ill-appearing, toxic-appearing or diaphoretic.  HENT:     Head: Normocephalic and atraumatic.     Right Ear: External ear normal.     Left Ear: External ear normal.     Nose: Nose normal.   Mouth/Throat:     Mouth: Mucous membranes are moist.     Pharynx: Oropharynx is clear.  Eyes:     General: No scleral icterus.       Right eye: No discharge.        Left eye: No discharge.     Extraocular Movements: Extraocular movements intact.     Conjunctiva/sclera: Conjunctivae normal.     Pupils: Pupils are equal, round, and reactive to light.  Neck:     Musculoskeletal: Normal range of motion and neck supple.  Cardiovascular:     Rate and Rhythm: Normal rate and regular rhythm.     Pulses: Normal pulses.     Heart sounds: Normal heart sounds. No murmur. No friction rub. No gallop.   Pulmonary:     Effort: Pulmonary effort is normal. No respiratory distress.     Breath sounds: Normal breath sounds. No stridor. No wheezing, rhonchi or rales.  Chest:     Chest wall: No tenderness.  Musculoskeletal: Normal range of motion.  Skin:    General: Skin is warm and dry.     Capillary Refill: Capillary refill takes less than 2 seconds.     Coloration: Skin is not jaundiced or pale.  Findings: No bruising, erythema, lesion or rash.     Comments: Small flesh-colored papule on R side of her upper lip  Neurological:     General: No focal deficit present.     Mental Status: She is alert and oriented to person, place, and time. Mental status is at baseline.  Psychiatric:        Mood and Affect: Mood normal.        Behavior: Behavior normal.        Thought Content: Thought content normal.        Judgment: Judgment normal.     Results for orders placed or performed in visit on 10/31/18  WET PREP FOR [place], YEAST, CLUE   Specimen: Urine   URINE  Result Value Ref Range   Trichomonas Exam Negative Negative   Yeast Exam Negative Negative   Clue Cell Exam Positive (A) Negative  GC/Chlamydia Probe Amp   Specimen: Urine   UR  Result Value Ref Range   Chlamydia trachomatis, NAA Negative Negative   Neisseria Gonorrhoeae by PCR Negative Negative  Microscopic Examination   URINE   Result Value Ref Range   WBC, UA None seen 0 - 5 /hpf   RBC 0-2 0 - 2 /hpf   Epithelial Cells (non renal) 0-10 0 - 10 /hpf   Bacteria, UA None seen None seen/Few  HIV Antibody (routine testing w rflx)  Result Value Ref Range   HIV Screen 4th Generation wRfx Non Reactive Non Reactive  RPR  Result Value Ref Range   RPR Ser Ql Non Reactive Non Reactive  UA/M w/rflx Culture, Routine   Specimen: Urine   URINE  Result Value Ref Range   Specific Gravity, UA 1.020 1.005 - 1.030   pH, UA 6.5 5.0 - 7.5   Color, UA Yellow Yellow   Appearance Ur Clear Clear   Leukocytes,UA Negative Negative   Protein,UA Negative Negative/Trace   Glucose, UA Negative Negative   Ketones, UA Negative Negative   RBC, UA Trace (A) Negative   Bilirubin, UA Negative Negative   Urobilinogen, Ur 1.0 0.2 - 1.0 mg/dL   Nitrite, UA Negative Negative   Microscopic Examination See below:       Assessment & Plan:   Problem List Items Addressed This Visit    None    Visit Diagnoses    Lip lesion    -  Primary   Likely pimple- will check labs for HSV and will treat with mupirocin. Call if not getting better or getting worse.    Relevant Orders   HSV(herpes simplex vrs) 1+2 ab-IgG       Follow up plan: Return if symptoms worsen or fail to improve.

## 2019-02-06 ENCOUNTER — Ambulatory Visit: Payer: BLUE CROSS/BLUE SHIELD | Admitting: Family Medicine

## 2019-02-06 LAB — HSV(HERPES SIMPLEX VRS) I + II AB-IGG
HSV 1 Glycoprotein G Ab, IgG: <0.91 index (ref 0.00–0.90)
HSV 2 IgG, Type Spec: 7.82 index — ABNORMAL HIGH (ref 0.00–0.90)

## 2019-05-02 ENCOUNTER — Encounter: Payer: BLUE CROSS/BLUE SHIELD | Admitting: Family Medicine

## 2019-05-09 ENCOUNTER — Encounter: Payer: BLUE CROSS/BLUE SHIELD | Admitting: Family Medicine

## 2019-05-09 ENCOUNTER — Ambulatory Visit: Payer: BC Managed Care – PPO | Admitting: Family Medicine

## 2019-05-13 ENCOUNTER — Other Ambulatory Visit: Payer: Self-pay | Admitting: Family Medicine

## 2019-05-13 NOTE — Telephone Encounter (Signed)
Requested medication (s) are due for refill today: yes  Requested medication (s) are on the active medication list: yes  Last refill:  02/13/2019  Future visit scheduled: yes  Notes to clinic:  Last filled by historical provider    Requested Prescriptions  Pending Prescriptions Disp Refills   buPROPion (WELLBUTRIN XL) 300 MG 24 hr tablet [Pharmacy Med Name: BUPROPION HCL XL 300 MG TABLET] 90 tablet 1    Sig: TAKE 1 [place]     Psychiatry: Antidepressants - bupropion Passed - 05/13/2019  1:38 AM      Passed - Last BP in normal range    BP Readings from Last 1 Encounters:  02/04/19 110/77         Passed - Valid encounter within last 6 months    Recent Outpatient Visits          3 months ago Lip lesion   [place], Megan P, DO   6 months ago BV (bacterial vaginosis)   [place], [place]   7 months ago Mild episode of recurrent major depressive disorder [facility])   [facility] Volney American, [place]   9 months ago [place], [place], [place]   11 months ago BV (bacterial vaginosis)   [facility] Valerie Roys, DO      Future Appointments            In 3 days Orene Desanctis, Lilia Argue, [place], [place] - Completed PHQ-2 or PHQ-9 in the last 360 days.

## 2019-05-13 NOTE — Telephone Encounter (Signed)
Routing to provider  

## 2019-05-16 ENCOUNTER — Encounter: Payer: BC Managed Care – PPO | Admitting: Family Medicine

## 2019-07-22 ENCOUNTER — Ambulatory Visit: Payer: BC Managed Care – PPO | Admitting: Family Medicine

## 2019-07-23 ENCOUNTER — Other Ambulatory Visit: Payer: Self-pay

## 2019-07-23 ENCOUNTER — Encounter: Payer: Self-pay | Admitting: Family Medicine

## 2019-07-23 ENCOUNTER — Ambulatory Visit: Payer: BC Managed Care – PPO | Attending: Internal Medicine

## 2019-07-23 ENCOUNTER — Ambulatory Visit (INDEPENDENT_AMBULATORY_CARE_PROVIDER_SITE_OTHER): Payer: BC Managed Care – PPO | Admitting: Family Medicine

## 2019-07-23 ENCOUNTER — Ambulatory Visit: Payer: BC Managed Care – PPO | Admitting: Family Medicine

## 2019-07-23 VITALS — Wt 197.0 lb

## 2019-07-23 DIAGNOSIS — N898 Other specified noninflammatory disorders of vagina: Secondary | ICD-10-CM | POA: Diagnosis not present

## 2019-07-23 DIAGNOSIS — R1030 Lower abdominal pain, unspecified: Secondary | ICD-10-CM | POA: Diagnosis not present

## 2019-07-23 DIAGNOSIS — F330 Major depressive disorder, recurrent, mild: Secondary | ICD-10-CM

## 2019-07-23 DIAGNOSIS — E669 Obesity, unspecified: Secondary | ICD-10-CM

## 2019-07-23 DIAGNOSIS — F419 Anxiety disorder, unspecified: Secondary | ICD-10-CM

## 2019-07-23 DIAGNOSIS — Z20822 Contact with and (suspected) exposure to covid-19: Secondary | ICD-10-CM

## 2019-07-23 LAB — UA/M W/RFLX CULTURE, ROUTINE
Bilirubin, UA: NEGATIVE
Glucose, UA: NEGATIVE
Ketones, UA: NEGATIVE
Leukocytes,UA: NEGATIVE
Nitrite, UA: NEGATIVE
Protein,UA: NEGATIVE
Specific Gravity, UA: 1.02 (ref 1.005–1.030)
Urobilinogen, Ur: 1 mg/dL (ref 0.2–1.0)
pH, UA: 7.5 (ref 5.0–7.5)

## 2019-07-23 LAB — WET PREP FOR TRICH, YEAST, CLUE
Clue Cell Exam: POSITIVE — AB
Trichomonas Exam: NEGATIVE
Yeast Exam: NEGATIVE

## 2019-07-23 LAB — MICROSCOPIC EXAMINATION: Bacteria, UA: NONE SEEN

## 2019-07-23 MED ORDER — METRONIDAZOLE 500 MG PO TABS: 500.0000 mg | ORAL_TABLET | Freq: Two times a day (BID) | ORAL | 0 refills | Status: DC

## 2019-07-23 NOTE — Progress Notes (Signed)
Wt 197 lb (89.4 kg)   BMI 34.35 kg/m    Subjective:    Patient ID: Lynn Vargas, female    DOB: 1981-01-09, 39 y.o.   MRN: [id number]  HPI: Lynn Vargas is a 39 y.o. female  Chief Complaint  Patient presents with  . Abdominal Pain    lower right side on and off for about a week  . Vaginal Itching    discharge   Patient presenting today with right lower abdominal cramping and vaginal discharge over a week now. Hx of frequent BV infections that typically feel this way. Denies urinary sxs, bowel changes, N/V, fevers. Not trying anything OTC currently. Uses unscented soaps and does not take baths.   Has weaned off her wellbutrin and buspar, feeling like she's in a great place right now as far as moods/anxiety. Denies any adverse effects of coming off of these a few months ago.   Depression screen [facility] 2/9 07/31/2018 03/15/2018 09/07/2017  Decreased Interest 3 1 0  Down, Depressed, Hopeless 3 2 0  PHQ - 2 Score 6 3 0  Altered sleeping 1 0 3  Tired, decreased energy 2 3 1   Change in appetite 3 3 1   Feeling bad or failure about yourself  0 0 0  Trouble concentrating 1 0 0  Moving slowly or fidgety/restless 0 0 0  Suicidal thoughts 0 0 0  PHQ-9 Score 13 9 5    GAD 7 : Generalized Anxiety Score 07/31/2018 03/15/2018  Nervous, Anxious, on Edge 1 3  Control/stop worrying 3 0  Worry too much - different things 2 2  Trouble relaxing 3 1  Restless 1 0  Easily annoyed or irritable 3 3  Afraid - awful might happen 0 0  Total GAD 7 Score 13 9  Anxiety Difficulty - Not difficult at all   Relevant past medical, surgical, family and social history reviewed and updated as indicated. Interim medical history since our last visit reviewed. Allergies and medications reviewed and updated.  Review of Systems  Per HPI unless specifically indicated above     Objective:    Wt 197 lb (89.4 kg)   BMI 34.35 kg/m   Wt Readings from Last 3 Encounters:  07/23/19 197 lb (89.4 kg)  02/04/19 201  lb (91.2 kg)  10/31/18 213 lb (96.6 kg)    Physical Exam Vitals and nursing note reviewed.  Constitutional:      Appearance: Normal appearance. She is not ill-appearing.  HENT:     Head: Atraumatic.  Eyes:     Extraocular Movements: Extraocular movements intact.     Conjunctiva/sclera: Conjunctivae normal.  Cardiovascular:     Rate and Rhythm: Normal rate and regular rhythm.     Heart sounds: Normal heart sounds.  Pulmonary:     Effort: Pulmonary effort is normal.     Breath sounds: Normal breath sounds.  Abdominal:     General: Bowel sounds are normal. There is no distension.     Palpations: Abdomen is soft.     Tenderness: There is no abdominal tenderness. There is no guarding.  Genitourinary:    Vagina: Vaginal discharge present.  Musculoskeletal:        General: Normal range of motion.     Cervical back: Normal range of motion and neck supple.  Skin:    General: Skin is warm and dry.  Neurological:     Mental Status: She is alert and oriented to person, place, and time.  Psychiatric:  Mood and Affect: Mood normal.        Thought Content: Thought content normal.        Judgment: Judgment normal.     Results for orders placed or performed in visit on 07/23/19  WET PREP FOR [place], YEAST, CLUE   Specimen: Urine   URINE  Result Value Ref Range   Trichomonas Exam Negative Negative   Yeast Exam Negative Negative   Clue Cell Exam Positive (A) Negative  Microscopic Examination   URINE  Result Value Ref Range   WBC, UA 0-5 0 - 5 /hpf   RBC 0-2 0 - 2 /hpf   Epithelial Cells (non renal) 0-10 0 - 10 /hpf   Bacteria, UA None seen None seen/Few  UA/M w/rflx Culture, Routine   Specimen: Urine   URINE  Result Value Ref Range   Specific Gravity, UA 1.020 1.005 - 1.030   pH, UA 7.5 5.0 - 7.5   Color, UA Yellow Yellow   Appearance Ur Clear Clear   Leukocytes,UA Negative Negative   Protein,UA Negative Negative/Trace   Glucose, UA Negative Negative   Ketones, UA  Negative Negative   RBC, UA Trace (A) Negative   Bilirubin, UA Negative Negative   Urobilinogen, Ur 1.0 0.2 - 1.0 mg/dL   Nitrite, UA Negative Negative   Microscopic Examination See below:       Assessment & Plan:   Problem List Items Addressed This Visit      Other   Depression    Stable and doing well without medications, continue off medications      Anxiety    Doing very well since weaning off medications, continue without and f/u if feeling she needs to be back on something      Obesity (BMI 35.0-39.9 without comorbidity)    Carefully managing diet and exercise, has maintained good weight loss off wellbutrin. Continue current regimen       Other Visit Diagnoses    Lower abdominal pain    -  Primary   Wet prep positive for BV. Treat with flagyl, continued good vaginal hygiene and probiotics. Can try boric acid suppositories as needed   Relevant Orders   UA/M w/rflx Culture, Routine (Completed)   Vaginal discharge       Relevant Orders   WET PREP FOR [place] (Completed)       Follow up plan: Return for CPE.

## 2019-07-24 LAB — NOVEL CORONAVIRUS, NAA: SARS-CoV-2, NAA: NOT DETECTED

## 2019-07-25 ENCOUNTER — Encounter: Payer: BC Managed Care – PPO | Admitting: Family Medicine

## 2019-07-28 NOTE — Assessment & Plan Note (Signed)
Doing very well since weaning off medications, continue without and f/u if feeling she needs to be back on something

## 2019-07-28 NOTE — Assessment & Plan Note (Signed)
Stable and doing well without medications, continue off medications

## 2019-07-28 NOTE — Assessment & Plan Note (Signed)
Carefully managing diet and exercise, has maintained good weight loss off wellbutrin. Continue current regimen

## 2019-08-07 ENCOUNTER — Encounter: Payer: BC Managed Care – PPO | Admitting: Family Medicine

## 2019-08-15 ENCOUNTER — Encounter: Payer: Self-pay | Admitting: Family Medicine

## 2019-08-19 ENCOUNTER — Other Ambulatory Visit: Payer: Self-pay | Admitting: Family Medicine

## 2019-08-19 MED ORDER — METRONIDAZOLE 500 MG PO TABS: 500.0000 mg | ORAL_TABLET | Freq: Two times a day (BID) | ORAL | 0 refills | Status: DC

## 2019-08-22 ENCOUNTER — Ambulatory Visit: Payer: BC Managed Care – PPO | Admitting: Family Medicine

## 2019-08-22 ENCOUNTER — Encounter: Payer: BC Managed Care – PPO | Admitting: Family Medicine

## 2019-10-31 ENCOUNTER — Ambulatory Visit (INDEPENDENT_AMBULATORY_CARE_PROVIDER_SITE_OTHER): Payer: BC Managed Care – PPO | Admitting: Family Medicine

## 2019-10-31 ENCOUNTER — Encounter: Payer: Self-pay | Admitting: Family Medicine

## 2019-10-31 ENCOUNTER — Other Ambulatory Visit: Payer: Self-pay

## 2019-10-31 VITALS — BP 120/83 | HR 85 | Temp 98.6°F | Wt 189.0 lb

## 2019-10-31 DIAGNOSIS — Z0000 Encounter for general adult medical examination without abnormal findings: Secondary | ICD-10-CM | POA: Diagnosis not present

## 2019-10-31 DIAGNOSIS — L659 Nonscarring hair loss, unspecified: Secondary | ICD-10-CM

## 2019-10-31 DIAGNOSIS — R5383 Other fatigue: Secondary | ICD-10-CM | POA: Diagnosis not present

## 2019-10-31 LAB — UA/M W/RFLX CULTURE, ROUTINE
Bilirubin, UA: NEGATIVE
Glucose, UA: NEGATIVE
Leukocytes,UA: NEGATIVE
Nitrite, UA: NEGATIVE
Protein,UA: NEGATIVE
Specific Gravity, UA: 1.02 (ref 1.005–1.030)
Urobilinogen, Ur: 1 mg/dL (ref 0.2–1.0)
pH, UA: 6 (ref 5.0–7.5)

## 2019-10-31 LAB — MICROSCOPIC EXAMINATION
Bacteria, UA: NONE SEEN
WBC, UA: NONE SEEN /hpf (ref 0–5)

## 2019-10-31 NOTE — Progress Notes (Deleted)
   BP 120/83   Pulse 85   Temp 98.6 F (37 C) (Oral)   Wt 189 lb (85.7 kg)   SpO2 93%   BMI 32.95 kg/m    Subjective:    Patient ID: Lynn Vargas, female    DOB: 19-Nov-1980, 40 y.o.   MRN: [id number]  HPI: Lynn Vargas is a 39 y.o. female  Chief Complaint  Patient presents with  . Alopecia    pt would like to have a complete blood work done   Fatigue, hair loss,   Relevant past medical, surgical, family and social history reviewed and updated as indicated. Interim medical history since our last visit reviewed. Allergies and medications reviewed and updated.  Review of Systems  Per HPI unless specifically indicated above     Objective:    BP 120/83   Pulse 85   Temp 98.6 F (37 C) (Oral)   Wt 189 lb (85.7 kg)   SpO2 93%   BMI 32.95 kg/m   Wt Readings from Last 3 Encounters:  10/31/19 189 lb (85.7 kg)  07/23/19 197 lb (89.4 kg)  02/04/19 201 lb (91.2 kg)    Physical Exam  Results for orders placed or performed in visit on 07/23/19  Novel Coronavirus, NAA ([facility])   Specimen: Nasopharyngeal(NP) swabs in vial transport medium   NASOPHARYNGE  TESTING  Result Value Ref Range   SARS-CoV-2, NAA Not Detected Not Detected      Assessment & Plan:   Problem List Items Addressed This Visit    None       Follow up plan: No follow-ups on file.

## 2019-11-01 LAB — CBC WITH DIFFERENTIAL/PLATELET
Basophils Absolute: 0.1 10*3/uL (ref 0.0–0.2)
Basos: 1 %
EOS (ABSOLUTE): 0.1 10*3/uL (ref 0.0–0.4)
Eos: 2 %
Hematocrit: 38.8 % (ref 34.0–46.6)
Hemoglobin: 13.2 g/dL (ref 11.1–15.9)
Immature Grans (Abs): 0 10*3/uL (ref 0.0–0.1)
Immature Granulocytes: 0 %
Lymphocytes Absolute: 1.6 10*3/uL (ref 0.7–3.1)
Lymphs: 24 %
MCH: 30.1 pg (ref 26.6–33.0)
MCHC: 34 g/dL (ref 31.5–35.7)
MCV: 89 fL (ref 79–97)
Monocytes Absolute: 0.5 10*3/uL (ref 0.1–0.9)
Monocytes: 8 %
Neutrophils Absolute: 4.5 10*3/uL (ref 1.4–7.0)
Neutrophils: 65 %
Platelets: 263 10*3/uL (ref 150–450)
RBC: 4.38 x10E6/uL (ref 3.77–5.28)
RDW: 13.3 % (ref 11.7–15.4)
WBC: 6.9 10*3/uL (ref 3.4–10.8)

## 2019-11-01 LAB — COMPREHENSIVE METABOLIC PANEL
ALT: 14 IU/L (ref 0–32)
AST: 17 IU/L (ref 0–40)
Albumin/Globulin Ratio: 1.9 (ref 1.2–2.2)
Albumin: 4.7 g/dL (ref 3.8–4.8)
Alkaline Phosphatase: 49 IU/L (ref 39–117)
BUN/Creatinine Ratio: 17 (ref 9–23)
BUN: 13 mg/dL (ref 6–20)
Bilirubin Total: 0.5 mg/dL (ref 0.0–1.2)
CO2: 22 mmol/L (ref 20–29)
Calcium: 9.8 mg/dL (ref 8.7–10.2)
Chloride: 102 mmol/L (ref 96–106)
Creatinine, Ser: 0.75 mg/dL (ref 0.57–1.00)
GFR calc Af Amer: 116 mL/min/{1.73_m2} (ref >59)
GFR calc non Af Amer: 101 mL/min/{1.73_m2} (ref >59)
Globulin, Total: 2.5 g/dL (ref 1.5–4.5)
Glucose: 111 mg/dL — ABNORMAL HIGH (ref 65–99)
Potassium: 4.4 mmol/L (ref 3.5–5.2)
Sodium: 138 mmol/L (ref 134–144)
Total Protein: 7.2 g/dL (ref 6.0–8.5)

## 2019-11-01 LAB — VITAMIN B12: Vitamin B-12: 1159 pg/mL (ref 232–1245)

## 2019-11-01 LAB — LIPID PANEL W/O CHOL/HDL RATIO
Cholesterol, Total: 195 mg/dL (ref 100–199)
HDL: 67 mg/dL (ref >39)
LDL Chol Calc (NIH): 117 mg/dL — ABNORMAL HIGH (ref 0–99)
Triglycerides: 58 mg/dL (ref 0–149)
VLDL Cholesterol Cal: 11 mg/dL (ref 5–40)

## 2019-11-01 LAB — TSH: TSH: 0.896 u[IU]/mL (ref 0.450–4.500)

## 2019-11-01 LAB — VITAMIN D 25 HYDROXY (VIT D DEFICIENCY, FRACTURES): Vit D, 25-Hydroxy: 29.4 ng/mL — ABNORMAL LOW (ref 30.0–100.0)

## 2019-11-05 NOTE — Progress Notes (Signed)
BP 120/83   Pulse 85   Temp 98.6 F (37 C) (Oral)   Wt 189 lb (85.7 kg)   SpO2 93%   BMI 32.95 kg/m    Subjective:    Patient ID: Lynn Vargas, female    DOB: October 13, 1980, 39 y.o.   MRN: [id number]  HPI: Lynn Vargas is a 39 y.o. female presenting on 10/31/2019 for comprehensive medical examination. Current medical complaints include:see below  Requesting full panel of bloodwork due to fatigue and hair loss recently. She believes the hair loss to mostly be from a recent perm she got but does overall feel run down and wonders if she's eating the right things and getting proper nutrition. Does take vitamins regularly.   She currently lives with: Menopausal Symptoms: no  Depression Screen done today and results listed below:  Depression screen [facility] 2/9 07/31/2018 03/15/2018 09/07/2017 02/05/2017 10/23/2016  Decreased Interest 3 1 0 0 0  Down, Depressed, Hopeless 3 2 0 - 0  PHQ - 2 Score 6 3 0 0 0  Altered sleeping 1 0 3 - 3  Tired, decreased energy 2 3 1  - 3  Change in appetite 3 3 1  - -  Feeling bad or failure about yourself  0 0 0 - 0  Trouble concentrating 1 0 0 - 0  Moving slowly or fidgety/restless 0 0 0 - 1  Suicidal thoughts 0 0 0 - 0  PHQ-9 Score 13 9 5  - 7    The patient does not have a history of falls. I did complete a risk assessment for falls. A plan of care for falls was documented.   Past Medical History:  Past Medical History:  Diagnosis Date  . Anemia   . Anxiety   . Cyst of perianal area    removed  . Depression   . 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 Gestational diabetes    diet controlled  . 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  . Hypertension    after pregnancy  . Shortness of breath   . UTI (lower urinary tract infection)     Surgical History:  Past Surgical History:  Procedure Laterality Date  . CESAREAN SECTION    . Pionidal cyst       Medications:  No current outpatient medications on file prior to visit.   No current facility-administered medications on file prior to visit.    Allergies:  Allergies  Allergen Reactions  . Latex     rash    Social History:  Social History   Socioeconomic History  . Marital status: Married    Spouse name: Not on file  . Number of children: Not on file  . Years of education: Not on file  . Highest education level: Not on file  Occupational History  . Not on file  Tobacco Use  . Smoking status: Never Smoker  . Smokeless tobacco: Never Used  Substance and Sexual Activity  . Alcohol use: No  . Drug use: No  . Sexual activity: Not Currently  Other Topics Concern  . Not on file  Social History Narrative  . Not on file   Social Determinants of Health   Financial Resource Strain:   . Difficulty of Paying Living Expenses:   Food Insecurity:   . Worried About Charity fundraiser in the Last Year:   . [employer]  of Food in the Last Year:   Transportation Needs:   . Film/video editor (Medical):   Marland Kitchen Lack of Transportation (Non-Medical):   Physical Activity:   . Days of Exercise per Week:   . Minutes of Exercise per Session:   Stress:   . Feeling of Stress :   Social Connections:   . Frequency of Communication with Friends and Family:   . Frequency of Social Gatherings with Friends and Family:   . Attends Religious Services:   . Active Member of Clubs or Organizations:   . Attends Archivist Meetings:   Marland Kitchen Marital Status:   Intimate Partner Violence:   . Fear of Current or Ex-Partner:   . Emotionally Abused:   Marland Kitchen Physically Abused:   . Sexually Abused:    Social History   Tobacco Use  Smoking Status Never Smoker  Smokeless Tobacco Never Used   Social History   Substance and Sexual Activity  Alcohol Use No    Family History:  Family History  Problem Relation Age of Onset  . Asthma Mother   . Cancer Daughter        placenta cancer    . Diabetes Maternal Aunt   . Heart disease Maternal Grandmother   . Stroke Neg Hx     Past medical history, surgical history, medications, allergies, family history and social history reviewed with patient today and changes made to appropriate areas of the chart.   Review of Systems - General ROS: positive for  - fatigue Psychological ROS: negative Ophthalmic ROS: negative ENT ROS: negative Allergy and Immunology ROS: negative Hematological and Lymphatic ROS: negative Endocrine ROS: negative Breast ROS: negative for breast lumps Respiratory ROS: no cough, shortness of breath, or wheezing Cardiovascular ROS: no chest pain or dyspnea on exertion Gastrointestinal ROS: no abdominal pain, change in bowel habits, or black or bloody stools Genito-Urinary ROS: no dysuria, trouble voiding, or hematuria Musculoskeletal ROS: negative Neurological ROS: no TIA or stroke symptoms Dermatological ROS: negative All other ROS negative except what is listed above and in the HPI.      Objective:    BP 120/83   Pulse 85   Temp 98.6 F (37 C) (Oral)   Wt 189 lb (85.7 kg)   SpO2 93%   BMI 32.95 kg/m   Wt Readings from Last 3 Encounters:  10/31/19 189 lb (85.7 kg)  07/23/19 197 lb (89.4 kg)  02/04/19 201 lb (91.2 kg)    Physical Exam Vitals and nursing note reviewed.  Constitutional:      General: She is not in acute distress.    Appearance: She is well-developed.  HENT:     Head: Atraumatic.     Right Ear: External ear normal.     Left Ear: External ear normal.     Nose: Nose normal.     Mouth/Throat:     Pharynx: No oropharyngeal exudate.  Eyes:     General: No scleral icterus.    Conjunctiva/sclera: Conjunctivae normal.     Pupils: Pupils are equal, round, and reactive to light.  Neck:     Thyroid: No thyromegaly.  Cardiovascular:     Rate and Rhythm: Normal rate and regular rhythm.     Heart sounds: Normal heart sounds.  Pulmonary:     Effort: Pulmonary effort is normal.  No respiratory distress.     Breath sounds: Normal breath sounds.  Chest:     Comments: Breast exam done through GYN Abdominal:  General: Bowel sounds are normal.     Palpations: Abdomen is soft. There is no mass.     Tenderness: There is no abdominal tenderness.  Genitourinary:    Comments: Done through GYN Musculoskeletal:        General: No tenderness. Normal range of motion.     Cervical back: Normal range of motion and neck supple.  Lymphadenopathy:     Cervical: No cervical adenopathy.  Skin:    General: Skin is warm and dry.     Findings: No rash.  Neurological:     Mental Status: She is alert and oriented to person, place, and time.     Cranial Nerves: No cranial nerve deficit.  Psychiatric:        Behavior: Behavior normal.     Results for orders placed or performed in visit on 10/31/19  Microscopic Examination   URINE  Result Value Ref Range   WBC, UA None seen 0 - 5 /hpf   RBC 3-10 (A) 0 - 2 /hpf   Epithelial Cells (non renal) 0-10 0 - 10 /hpf   Bacteria, UA None seen None seen/Few  CBC with Differential/Platelet  Result Value Ref Range   WBC 6.9 3.4 - 10.8 x10E3/uL   RBC 4.38 3.77 - 5.28 x10E6/uL   Hemoglobin 13.2 11.1 - 15.9 g/dL   Hematocrit 38.8 34.0 - 46.6 %   MCV 89 79 - 97 fL   MCH 30.1 26.6 - 33.0 pg   MCHC 34.0 31.5 - 35.7 g/dL   RDW 13.3 11.7 - 15.4 %   Platelets 263 150 - 450 x10E3/uL   Neutrophils 65 Not Estab. %   Lymphs 24 Not Estab. %   Monocytes 8 Not Estab. %   Eos 2 Not Estab. %   Basos 1 Not Estab. %   Neutrophils Absolute 4.5 1.4 - 7.0 x10E3/uL   Lymphocytes Absolute 1.6 0.7 - 3.1 x10E3/uL   Monocytes Absolute 0.5 0.1 - 0.9 x10E3/uL   EOS (ABSOLUTE) 0.1 0.0 - 0.4 x10E3/uL   Basophils Absolute 0.1 0.0 - 0.2 x10E3/uL   Immature Granulocytes 0 Not Estab. %   Immature Grans (Abs) 0.0 0.0 - 0.1 x10E3/uL  Comprehensive metabolic panel  Result Value Ref Range   Glucose 111 (H) 65 - 99 mg/dL   BUN 13 6 - 20 mg/dL   Creatinine, Ser  0.75 0.57 - 1.00 mg/dL   GFR calc non Af Amer 101 >59 mL/min/1.73   GFR calc Af Amer 116 >59 mL/min/1.73   BUN/Creatinine Ratio 17 9 - 23   Sodium 138 134 - 144 mmol/L   Potassium 4.4 3.5 - 5.2 mmol/L   Chloride 102 96 - 106 mmol/L   CO2 22 20 - 29 mmol/L   Calcium 9.8 8.7 - 10.2 mg/dL   Total Protein 7.2 6.0 - 8.5 g/dL   Albumin 4.7 3.8 - 4.8 g/dL   Globulin, Total 2.5 1.5 - 4.5 g/dL   Albumin/Globulin Ratio 1.9 1.2 - 2.2   Bilirubin Total 0.5 0.0 - 1.2 mg/dL   Alkaline Phosphatase 49 39 - 117 IU/L   AST 17 0 - 40 IU/L   ALT 14 0 - 32 IU/L  Lipid Panel w/o Chol/HDL Ratio  Result Value Ref Range   Cholesterol, Total 195 100 - 199 mg/dL   Triglycerides 58 0 - 149 mg/dL   HDL 67 >39 mg/dL   VLDL Cholesterol Cal 11 5 - 40 mg/dL   LDL Chol Calc (NIH) 117 (H) 0 - 99  mg/dL  VITAMIN D 25 Hydroxy (Vit-D Deficiency, Fractures)  Result Value Ref Range   Vit D, 25-Hydroxy 29.4 (L) 30.0 - 100.0 ng/mL  Vitamin B12  Result Value Ref Range   Vitamin B-12 1,159 232 - 1,245 pg/mL  TSH  Result Value Ref Range   TSH 0.896 0.450 - 4.500 uIU/mL  UA/M w/rflx Culture, Routine   Specimen: Urine   URINE  Result Value Ref Range   Specific Gravity, UA 1.020 1.005 - 1.030   pH, UA 6.0 5.0 - 7.5   Color, UA Yellow Yellow   Appearance Ur Clear Clear   Leukocytes,UA Negative Negative   Protein,UA Negative Negative/Trace   Glucose, UA Negative Negative   Ketones, UA 1+ (A) Negative   RBC, UA Trace (A) Negative   Bilirubin, UA Negative Negative   Urobilinogen, Ur 1.0 0.2 - 1.0 mg/dL   Nitrite, UA Negative Negative   Microscopic Examination See below:       Assessment & Plan:   Problem List Items Addressed This Visit    None    Visit Diagnoses    Fatigue, unspecified type    -  Primary   Will add vitamin levels to basic lab panel. Continue good diet and exercise, vitamin regimen and adjust as needed based on labs   Relevant Orders   CBC with Differential/Platelet (Completed)   VITAMIN D  25 Hydroxy (Vit-D Deficiency, Fractures) (Completed)   Vitamin B12 (Completed)   TSH (Completed)   UA/M w/rflx Culture, Routine (Completed)   Annual physical exam       Relevant Orders   Comprehensive metabolic panel (Completed)   Lipid Panel w/o Chol/HDL Ratio (Completed)   Hair loss       Avoid harsh chemical treatments and products, vitamin regimen reviewed. Drink plenty of water       Follow up plan: Return in about 1 year (around 10/30/2020) for CPE.   LABORATORY TESTING:  - Pap smear: due, scheduled with GYN  IMMUNIZATIONS:   - Tdap: Tetanus vaccination status reviewed: last tetanus booster within 10 years. - Influenza: Up to date    PATIENT COUNSELING:   Advised to take 1 mg of folate supplement per day if capable of pregnancy.   Sexuality: Discussed sexually transmitted diseases, partner selection, use of condoms, avoidance of unintended pregnancy  and contraceptive alternatives.   Advised to avoid cigarette smoking.  I discussed with the patient that most people either abstain from alcohol or drink within safe limits (<=14/week and <=4 drinks/occasion for males, <=7/weeks and <= 3 drinks/occasion for females) and that the risk for alcohol disorders and other health effects rises proportionally with the number of drinks per week and how often a drinker exceeds daily limits.  Discussed cessation/primary prevention of drug use and availability of treatment for abuse.   Diet: Encouraged to adjust caloric intake to maintain  or achieve ideal body weight, to reduce intake of dietary saturated fat and total fat, to limit sodium intake by avoiding high sodium foods and not adding table salt, and to maintain adequate dietary potassium and calcium preferably from fresh fruits, vegetables, and low-fat dairy products.    stressed the importance of regular exercise  Injury prevention: Discussed safety belts, safety helmets, smoke detector, smoking near bedding or upholstery.    Dental health: Discussed importance of regular tooth brushing, flossing, and dental visits.    NEXT PREVENTATIVE PHYSICAL DUE IN 1 YEAR. Return in about 1 year (around 10/30/2020) for CPE.

## 2020-01-16 ENCOUNTER — Other Ambulatory Visit: Payer: Self-pay

## 2020-01-16 ENCOUNTER — Ambulatory Visit (INDEPENDENT_AMBULATORY_CARE_PROVIDER_SITE_OTHER): Payer: BC Managed Care – PPO | Admitting: Family Medicine

## 2020-01-16 ENCOUNTER — Encounter: Payer: Self-pay | Admitting: Family Medicine

## 2020-01-16 VITALS — BP 110/76 | HR 84 | Temp 98.8°F | Wt 194.0 lb

## 2020-01-16 DIAGNOSIS — R102 Pelvic and perineal pain: Secondary | ICD-10-CM

## 2020-01-16 DIAGNOSIS — R300 Dysuria: Secondary | ICD-10-CM

## 2020-01-16 LAB — UA/M W/RFLX CULTURE, ROUTINE
Bilirubin, UA: NEGATIVE
Glucose, UA: NEGATIVE
Ketones, UA: NEGATIVE
Leukocytes,UA: NEGATIVE
Nitrite, UA: NEGATIVE
Protein,UA: NEGATIVE
Specific Gravity, UA: 1.02 (ref 1.005–1.030)
Urobilinogen, Ur: 0.2 mg/dL (ref 0.2–1.0)
pH, UA: 6 (ref 5.0–7.5)

## 2020-01-16 LAB — MICROSCOPIC EXAMINATION
Bacteria, UA: NONE SEEN
WBC, UA: NONE SEEN /hpf (ref 0–5)

## 2020-01-16 NOTE — Progress Notes (Signed)
BP 110/76   Pulse 84   Temp 98.8 F (37.1 C) (Oral)   Wt 194 lb (88 kg)   SpO2 97%   BMI 33.83 kg/m    Subjective:    Patient ID: Lynn Vargas, female    DOB: 05/20/1981, 39 y.o.   MRN: [id number]  HPI: Lynn Vargas is a 39 y.o. female  Chief Complaint  Patient presents with  . Urinary Tract Infection    pelvic pain, x 2 weeks  . Night Sweats    body itching   Right pelvic pain for about 2 weeks now. Went to UC around that time and was diagnosed with a yeast infection. At that time was having itching and dysuria as well which seem to have gotten better. Having night sweats recently off and on too but otherwise nothing else new symptom-wise. Period started yesterday, still having a fairly regular cycle. Does have a hx of recurrent BV but not currently having any sxs she correlates to that. Hx of HPV.   Relevant past medical, surgical, family and social history reviewed and updated as indicated. Interim medical history since our last visit reviewed. Allergies and medications reviewed and updated.  Review of Systems  Per HPI unless specifically indicated above     Objective:    BP 110/76   Pulse 84   Temp 98.8 F (37.1 C) (Oral)   Wt 194 lb (88 kg)   SpO2 97%   BMI 33.83 kg/m   Wt Readings from Last 3 Encounters:  01/16/20 194 lb (88 kg)  10/31/19 189 lb (85.7 kg)  07/23/19 197 lb (89.4 kg)    Physical Exam Vitals and nursing note reviewed.  Constitutional:      Appearance: Normal appearance. She is not ill-appearing.  HENT:     Head: Atraumatic.  Eyes:     Extraocular Movements: Extraocular movements intact.     Conjunctiva/sclera: Conjunctivae normal.  Cardiovascular:     Rate and Rhythm: Normal rate and regular rhythm.     Heart sounds: Normal heart sounds.  Pulmonary:     Effort: Pulmonary effort is normal.     Breath sounds: Normal breath sounds.  Abdominal:     General: Bowel sounds are normal. There is no distension.     Palpations: Abdomen  is soft.     Tenderness: There is no abdominal tenderness. There is no right CVA tenderness, left CVA tenderness or guarding.  Musculoskeletal:        General: Normal range of motion.     Cervical back: Normal range of motion and neck supple.  Skin:    General: Skin is warm and dry.  Neurological:     Mental Status: She is alert and oriented to person, place, and time.  Psychiatric:        Mood and Affect: Mood normal.        Thought Content: Thought content normal.        Judgment: Judgment normal.     Results for orders placed or performed in visit on 01/16/20  Microscopic Examination   Urine  Result Value Ref Range   WBC, UA None seen 0 - 5 /hpf   RBC 3-10 (A) 0 - 2 /hpf   Epithelial Cells (non renal) 0-10 0 - 10 /hpf   Bacteria, UA None seen None seen/Few  UA/M w/rflx Culture, Routine   Specimen: Urine   Urine  Result Value Ref Range   Specific Gravity, UA 1.020 1.005 - 1.030  pH, UA 6.0 5.0 - 7.5   Color, UA Yellow Yellow   Appearance Ur Clear Clear   Leukocytes,UA Negative Negative   Protein,UA Negative Negative/Trace   Glucose, UA Negative Negative   Ketones, UA Negative Negative   RBC, UA 3+ (A) Negative   Bilirubin, UA Negative Negative   Urobilinogen, Ur 0.2 0.2 - 1.0 mg/dL   Nitrite, UA Negative Negative   Microscopic Examination See below:       Assessment & Plan:   Problem List Items Addressed This Visit    None    Visit Diagnoses    Pelvic pain    -  Primary   U/A benign, unable to perform wet prep d/t menstruation at this time. Will order transvaginal u/s per pt request for further eval, has appt with GYN next month   Relevant Orders   US Transvaginal Non-OB   Dysuria       Relevant Orders   UA/M w/rflx Culture, Routine (Completed)       Follow up plan: Return if symptoms worsen or fail to improve.

## 2020-01-20 ENCOUNTER — Encounter: Payer: Self-pay | Admitting: Family Medicine

## 2020-01-21 ENCOUNTER — Other Ambulatory Visit: Payer: Self-pay | Admitting: Family Medicine

## 2020-01-21 MED ORDER — VALACYCLOVIR HCL 1 G PO TABS: 1000.0000 mg | ORAL_TABLET | Freq: Every day | ORAL | 0 refills | Status: DC

## 2020-01-23 ENCOUNTER — Ambulatory Visit: Payer: BC Managed Care – PPO

## 2020-02-01 ENCOUNTER — Encounter (HOSPITAL_COMMUNITY): Payer: Self-pay

## 2020-02-01 ENCOUNTER — Emergency Department (HOSPITAL_COMMUNITY)
Admission: EM | Admit: 2020-02-01 | Discharge: 2020-02-01 | Disposition: A | Discharge status: Home / Self Care | Payer: BC Managed Care – PPO | Attending: Emergency Medicine | Admitting: Emergency Medicine

## 2020-02-01 ENCOUNTER — Other Ambulatory Visit: Payer: Self-pay

## 2020-02-01 DIAGNOSIS — R102 Pelvic and perineal pain: Secondary | ICD-10-CM | POA: Diagnosis not present

## 2020-02-01 DIAGNOSIS — N771 Vaginitis, vulvitis and vulvovaginitis in diseases classified elsewhere: Secondary | ICD-10-CM | POA: Diagnosis not present

## 2020-02-01 DIAGNOSIS — Z5321 Procedure and treatment not carried out due to patient leaving prior to being seen by health care provider: Secondary | ICD-10-CM | POA: Diagnosis not present

## 2020-02-01 NOTE — ED Triage Notes (Addendum)
Pt presents with c/o pelvic pain off and on for a month and a half. Pt suffered from the same last year and had an MRI that confirmed a cyst. Pt also reports several bouts with bacterial vaginosis. Pt also currently being treated for a herpes outbreak.

## 2020-02-04 ENCOUNTER — Ambulatory Visit: Payer: BC Managed Care – PPO

## 2020-02-04 ENCOUNTER — Ambulatory Visit: Payer: BC Managed Care – PPO | Admitting: Family Medicine

## 2020-02-09 ENCOUNTER — Other Ambulatory Visit: Payer: Self-pay

## 2020-02-09 ENCOUNTER — Ambulatory Visit
Admission: RE | Admit: 2020-02-09 | Discharge: 2020-02-09 | Disposition: A | Discharge status: Home / Self Care | Payer: BC Managed Care – PPO | Source: Ambulatory Visit | Attending: Emergency Medicine | Admitting: Emergency Medicine

## 2020-02-09 ENCOUNTER — Other Ambulatory Visit: Payer: Self-pay | Admitting: Family Medicine

## 2020-02-09 VITALS — BP 123/80 | HR 84 | Temp 98.4°F | Resp 16

## 2020-02-09 DIAGNOSIS — J029 Acute pharyngitis, unspecified: Secondary | ICD-10-CM

## 2020-02-09 DIAGNOSIS — Z8742 Personal history of other diseases of the female genital tract: Secondary | ICD-10-CM

## 2020-02-09 DIAGNOSIS — R102 Pelvic and perineal pain: Secondary | ICD-10-CM

## 2020-02-09 LAB — POCT RAPID STREP A (OFFICE): Rapid Strep A Screen: NEGATIVE

## 2020-02-09 NOTE — Discharge Instructions (Signed)
Your rapid strep test is negative.  A throat culture is pending; we will call you if it is positive requiring treatment.    Your COVID test is pending.  You should self quarantine until the test result is back.    Take Tylenol as needed for fever or discomfort.  Rest and keep yourself hydrated.    Follow up with your primary care provider if your symptoms are not improving.      

## 2020-02-09 NOTE — ED Provider Notes (Signed)
Lynn Vargas    CSN: [id number] Arrival date & time: 02/09/20  1349      History   Chief Complaint Chief Complaint  Patient presents with  . Sore Throat    HPI Lynn Vargas is a 39 y.o. female.   Patient presents with 3-day history of sore throat and swollen lymph nodes.  She denies fever, chills, rash, cough, shortness of breath, abdominal pain, or other symptoms.  Treatment attempted at home with Zyrtec.      The history is provided by the patient.    Past Medical History:  Diagnosis Date  . Anemia   . Anxiety   . Cyst of perianal area    removed  . Depression   . 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 Gestational diabetes    diet controlled  . 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  . Hypertension    after pregnancy  . Shortness of breath   . UTI (lower urinary tract infection)     Patient Active Problem List   Diagnosis Date Noted  . Obesity 08/04/2018  . Obesity (BMI 35.0-39.9 without comorbidity) 03/15/2018  . Tendonitis of wrist, right 08/03/2017  . Depression   . Anxiety   . Bacteremia due to Enterococcus 04/22/2011  . Endometritis 04/19/2011  . Anemia 04/14/2011  . 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  . Polyhydramnios, maternal, antepartum 04/12/2011    Past Surgical History:  Procedure Laterality Date  . CESAREAN SECTION    . Pionidal cyst      OB History    Gravida  6   Para  3   Term  3   Preterm  0   AB  2   Living  3     SAB  0   TAB  2   Ectopic  0   Multiple  0   Live Births               Home Medications    Prior to Admission medications   Medication Sig Start Date End Date Taking? Authorizing Provider  valACYclovir (VALTREX) 1000 MG tablet Take 1 tablet (1,000 mg total) by mouth daily. 01/21/20   Volney American, PA-C    Family History Family History  Problem Relation Age of Onset   . Asthma Mother   . Cancer Daughter        placenta cancer  . Diabetes Maternal Aunt   . Heart disease Maternal Grandmother   . Stroke Neg Hx     Social History Social History   Tobacco Use  . Smoking status: Never Smoker  . Smokeless tobacco: Never Used  Vaping Use  . Vaping Use: Never used  Substance Use Topics  . Alcohol use: No  . Drug use: No     Allergies   Latex   Review of Systems Review of Systems  Constitutional: Negative for chills and fever.  HENT: Positive for sore throat. Negative for congestion, ear pain and trouble swallowing.   Eyes: Negative for pain and visual disturbance.  Respiratory: Negative for cough and shortness of breath.   Cardiovascular: Negative for chest pain and palpitations.  Gastrointestinal: Negative for abdominal pain, diarrhea, nausea and vomiting.  Genitourinary: Negative for dysuria and hematuria.  Musculoskeletal: Negative for arthralgias and back pain.  Skin: Negative for color change and rash.  Neurological: Negative for seizures and syncope.  All  other systems reviewed and are negative.    Physical Exam Triage Vital Signs ED Triage Vitals [02/09/20 1352]  Enc Vitals Group     BP 123/80     Pulse Rate 84     Resp 16     Temp 98.4 F (36.9 C)     Temp Source Oral     SpO2 97 %     Weight      Height      Head Circumference      Peak Flow      Pain Score 3     Pain Loc      Pain Edu?      Excl. in [place]?    No data found.  Updated Vital Signs BP 123/80   Pulse 84   Temp 98.4 F (36.9 C) (Oral)   Resp 16   LMP 01/11/2020 (Approximate)   SpO2 97%   Visual Acuity Right Eye Distance:   Left Eye Distance:   Bilateral Distance:    Right Eye Near:   Left Eye Near:    Bilateral Near:     Physical Exam Vitals and nursing note reviewed.  Constitutional:      General: She is not in acute distress.    Appearance: She is well-developed. She is not ill-appearing.  HENT:     Head: Normocephalic and  atraumatic.     Right Ear: Tympanic membrane normal.     Left Ear: Tympanic membrane normal.     Nose: Nose normal.     Mouth/Throat:     Mouth: Mucous membranes are moist.     Pharynx: Posterior oropharyngeal erythema present. No oropharyngeal exudate.  Eyes:     Conjunctiva/sclera: Conjunctivae normal.  Cardiovascular:     Rate and Rhythm: Normal rate and regular rhythm.     Heart sounds: No murmur heard.   Pulmonary:     Effort: Pulmonary effort is normal. No respiratory distress.     Breath sounds: Normal breath sounds.  Abdominal:     Palpations: Abdomen is soft.     Tenderness: There is no abdominal tenderness. There is no guarding or rebound.  Musculoskeletal:     Cervical back: Neck supple.  Skin:    General: Skin is warm and dry.     Findings: No rash.  Neurological:     General: No focal deficit present.     Mental Status: She is alert and oriented to person, place, and time.     Gait: Gait normal.  Psychiatric:        Mood and Affect: Mood normal.        Behavior: Behavior normal.      UC Treatments / Results  Labs (all labs ordered are listed, but only abnormal results are displayed) Labs Reviewed  NOVEL CORONAVIRUS, NAA  POCT RAPID STREP A (OFFICE)    EKG   Radiology No results found.  Procedures Procedures (including critical care time)  Medications Ordered in UC Medications - No data to display  Initial Impression / Assessment and Plan / UC Course  I have reviewed the triage vital signs and the nursing notes.  Pertinent labs & imaging results that were available during my care of the patient were reviewed by me and considered in my medical decision making (see chart for details).   Sore throat.  Rapid strep negative; throat culture pending.  PCR COVID pending.  Instructed patient to self quarantine until the test result is back.  Symptomatic treatment with Tylenol  or ibuprofen as needed for discomfort.  Instructed patient to follow-up with  her PCP if her symptoms are not improving.  Patient agrees to plan of care.   Final Clinical Impressions(s) / UC Diagnoses   Final diagnoses:  Sore throat     Discharge Instructions     Your rapid strep test is negative.  A throat culture is pending; we will call you if it is positive requiring treatment.    Your COVID test is pending.  You should self quarantine until the test result is back.    Take Tylenol as needed for fever or discomfort.  Rest and keep yourself hydrated.    Follow up with your primary care provider if your symptoms are not improving.         ED Prescriptions    None     PDMP not reviewed this encounter.   Sharion Balloon, NP 02/09/20 1434

## 2020-02-09 NOTE — ED Triage Notes (Signed)
Pt presents to UC for sore throat x3 days. Pt also complaining of swollen lymph nodes. Pt concerned for strep throat. Pt has been treating with zyrtec with out relief. Pt denies fevers at home.

## 2020-02-10 LAB — NOVEL CORONAVIRUS, NAA: SARS-CoV-2, NAA: NOT DETECTED

## 2020-02-10 LAB — SARS-COV-2, NAA 2 DAY TAT

## 2020-02-11 ENCOUNTER — Ambulatory Visit: Admission: RE | Admit: 2020-02-11 | Payer: BC Managed Care – PPO | Source: Ambulatory Visit

## 2020-02-11 ENCOUNTER — Telehealth: Payer: BC Managed Care – PPO | Admitting: Family Medicine

## 2020-03-02 ENCOUNTER — Other Ambulatory Visit: Payer: Self-pay

## 2020-03-02 ENCOUNTER — Ambulatory Visit
Admission: RE | Admit: 2020-03-02 | Discharge: 2020-03-02 | Disposition: A | Discharge status: Home / Self Care | Payer: BC Managed Care – PPO | Source: Ambulatory Visit | Attending: Family Medicine | Admitting: Family Medicine

## 2020-03-02 DIAGNOSIS — R102 Pelvic and perineal pain unspecified side: Secondary | ICD-10-CM

## 2020-03-02 DIAGNOSIS — Z8742 Personal history of other diseases of the female genital tract: Secondary | ICD-10-CM

## 2020-04-02 ENCOUNTER — Ambulatory Visit
Admission: RE | Admit: 2020-04-02 | Discharge: 2020-04-02 | Disposition: A | Discharge status: Home / Self Care | Payer: BC Managed Care – PPO | Source: Ambulatory Visit | Attending: Emergency Medicine | Admitting: Emergency Medicine

## 2020-04-02 ENCOUNTER — Other Ambulatory Visit: Payer: Self-pay

## 2020-04-02 VITALS — BP 113/82 | HR 80 | Temp 98.9°F | Resp 18

## 2020-04-02 DIAGNOSIS — Z20822 Contact with and (suspected) exposure to covid-19: Secondary | ICD-10-CM

## 2020-04-02 DIAGNOSIS — R0981 Nasal congestion: Secondary | ICD-10-CM

## 2020-04-02 NOTE — ED Provider Notes (Signed)
Lynn Vargas    CSN: [id number] Arrival date & time: 04/02/20  1448      History   Chief Complaint Chief Complaint  Patient presents with  . Covid Exposure  . Sinus Problem    HPI Lynn Vargas is a 39 y.o. female. Patient presents with sinus congestion since this morning.  Her children tested COVID positive on 03/22/2020.  She denies fever, sore throat, cough, shortness of breath, vomiting, diarrhea, rash, or other symptoms.  No treatment attempted at home.    The history is provided by the patient.    Past Medical History:  Diagnosis Date  . Anemia   . Anxiety   . Cyst of perianal area    removed  . Depression   . 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 Gestational diabetes    diet controlled  . 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  . Hypertension    after pregnancy  . Shortness of breath   . UTI (lower urinary tract infection)     Patient Active Problem List   Diagnosis Date Noted  . Obesity 08/04/2018  . Obesity (BMI 35.0-39.9 without comorbidity) 03/15/2018  . Tendonitis of wrist, right 08/03/2017  . Depression   . Anxiety   . Bacteremia due to Enterococcus 04/22/2011  . Endometritis 04/19/2011  . Anemia 04/14/2011  . 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  . Polyhydramnios, maternal, antepartum 04/12/2011    Past Surgical History:  Procedure Laterality Date  . CESAREAN SECTION    . Pionidal cyst      OB History    Gravida  6   Para  3   Term  3   Preterm  0   AB  2   Living  3     SAB  0   TAB  2   Ectopic  0   Multiple  0   Live Births               Home Medications    Prior to Admission medications   Medication Sig Start Date End Date Taking? Authorizing Provider  valACYclovir (VALTREX) 1000 MG tablet Take 1 tablet (1,000 mg total) by mouth daily. Patient taking differently: Take 1,000 mg by mouth daily  as needed.  01/21/20   Volney American, PA-C    Family History Family History  Problem Relation Age of Onset  . Asthma Mother   . Cancer Daughter        placenta cancer  . Diabetes Maternal Aunt   . Heart disease Maternal Grandmother   . Stroke Neg Hx     Social History Social History   Tobacco Use  . Smoking status: Never Smoker  . Smokeless tobacco: Never Used  Vaping Use  . Vaping Use: Never used  Substance Use Topics  . Alcohol use: No  . Drug use: No     Allergies   Latex   Review of Systems Review of Systems  Constitutional: Negative for chills and fever.  HENT: Positive for congestion. Negative for ear pain and sore throat.   Eyes: Negative for pain and visual disturbance.  Respiratory: Negative for cough and shortness of breath.   Cardiovascular: Negative for chest pain and palpitations.  Gastrointestinal: Negative for abdominal pain, diarrhea and vomiting.  Genitourinary: Negative for dysuria and hematuria.  Musculoskeletal: Negative for arthralgias and back pain.  Skin: Negative for  color change and rash.  Neurological: Negative for seizures and syncope.  All other systems reviewed and are negative.    Physical Exam Triage Vital Signs ED Triage Vitals  Enc Vitals Group     BP 04/02/20 1538 113/82     Pulse Rate 04/02/20 1538 80     Resp 04/02/20 1538 18     Temp 04/02/20 1538 98.9 F (37.2 C)     Temp src --      SpO2 04/02/20 1538 98 %     Weight --      Height --      Head Circumference --      Peak Flow --      Pain Score 04/02/20 1535 3     Pain Loc --      Pain Edu? --      Excl. in [place]? --    No data found.  Updated Vital Signs BP 113/82   Pulse 80   Temp 98.9 F (37.2 C)   Resp 18   SpO2 98%   Visual Acuity Right Eye Distance:   Left Eye Distance:   Bilateral Distance:    Right Eye Near:   Left Eye Near:    Bilateral Near:     Physical Exam Vitals and nursing note reviewed.  Constitutional:      General:  She is not in acute distress.    Appearance: She is well-developed. She is not ill-appearing.  HENT:     Head: Normocephalic and atraumatic.     Right Ear: Tympanic membrane normal.     Left Ear: Tympanic membrane normal.     Nose: Nose normal.     Mouth/Throat:     Mouth: Mucous membranes are moist.     Pharynx: Oropharynx is clear.  Eyes:     Conjunctiva/sclera: Conjunctivae normal.  Cardiovascular:     Rate and Rhythm: Normal rate and regular rhythm.     Heart sounds: No murmur heard.   Pulmonary:     Effort: Pulmonary effort is normal. No respiratory distress.     Breath sounds: Normal breath sounds.  Abdominal:     Palpations: Abdomen is soft.     Tenderness: There is no abdominal tenderness. There is no guarding or rebound.  Musculoskeletal:     Cervical back: Neck supple.  Skin:    General: Skin is warm and dry.     Findings: No rash.  Neurological:     General: No focal deficit present.     Mental Status: She is alert and oriented to person, place, and time.     Gait: Gait normal.  Psychiatric:        Mood and Affect: Mood normal.        Behavior: Behavior normal.      UC Treatments / Results  Labs (all labs ordered are listed, but only abnormal results are displayed) Labs Reviewed  NOVEL CORONAVIRUS, NAA    EKG   Radiology No results found.  Procedures Procedures (including critical care time)  Medications Ordered in UC Medications - No data to display  Initial Impression / Assessment and Plan / UC Course  I have reviewed the triage vital signs and the nursing notes.  Pertinent labs & imaging results that were available during my care of the patient were reviewed by me and considered in my medical decision making (see chart for details).   Sinus congestion. Exposure to COVID.  PCR COVID pending.  Instructed patient to self quarantine  until the test result is back.  Discussed symptomatic treatment including Tylenol, rest, hydration.  Instructed  patient to go to the ED if she has acute worsening symptoms.  Patient agrees to plan of care.    Final Clinical Impressions(s) / UC Diagnoses   Final diagnoses:  Sinus congestion  Exposure to COVID-19 virus     Discharge Instructions     Your COVID test is pending.  You should self quarantine until the test result is back.    Take Tylenol as needed for fever or discomfort.  Rest and keep yourself hydrated.    Go to the emergency department if you develop acute worsening symptoms.        ED Prescriptions    None     PDMP not reviewed this encounter.   Sharion Balloon, NP 04/02/20 1556

## 2020-04-02 NOTE — ED Triage Notes (Signed)
Patient reports her children tested positive for COVID on 9/13. States that she is having sinus problems and is requesting COVID testing.

## 2020-04-02 NOTE — Discharge Instructions (Signed)
Your COVID test is pending.  You should self quarantine until the test result is back.    Take Tylenol as needed for fever or discomfort.  Rest and keep yourself hydrated.    Go to the emergency department if you develop acute worsening symptoms.     

## 2020-04-04 LAB — SARS-COV-2, NAA 2 DAY TAT

## 2020-04-04 LAB — NOVEL CORONAVIRUS, NAA: SARS-CoV-2, NAA: NOT DETECTED

## 2020-04-13 ENCOUNTER — Other Ambulatory Visit: Payer: Self-pay | Admitting: Family Medicine

## 2020-05-17 IMAGING — US US PELVIS COMPLETE
1 series · 14 of 25 positions shown · non-contrast
Comparison: Prior ultrasound from 10/28/2010

CLINICAL DATA: Initial evaluation for right-sided pelvic cramping.
History of ovarian cyst.

EXAM:
TRANSABDOMINAL ULTRASOUND OF PELVIS
TECHNIQUE: Transabdominal ultrasound examination of the pelvis was performed
including evaluation of the uterus, ovaries, adnexal regions, and
pelvic cul-de-sac.

[Series 1: us pelvis complete · 0.26mm/px · 14 of 52 slices shown]
[im 1/52]
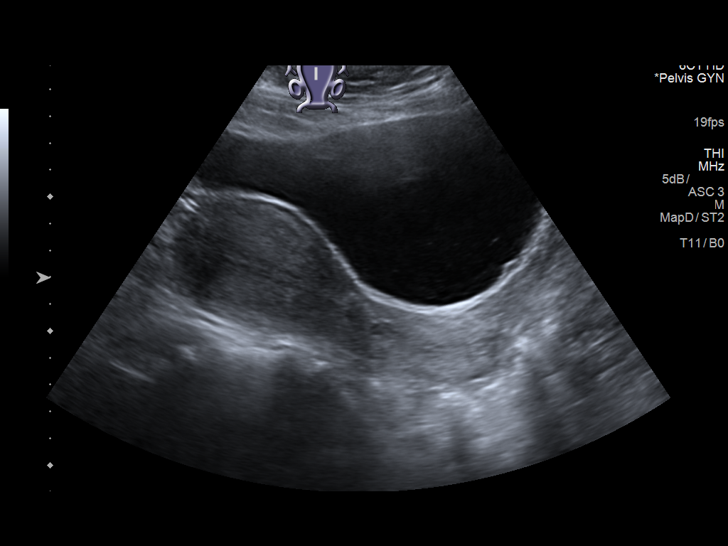
[im 5/52]
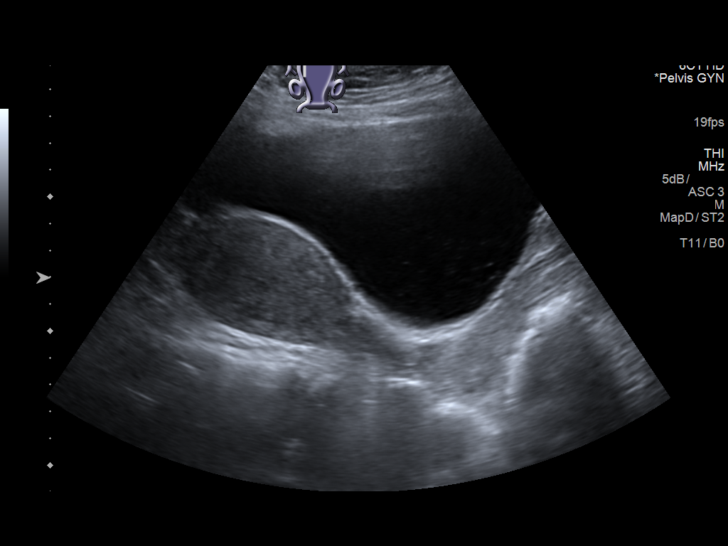
[im 9/52]
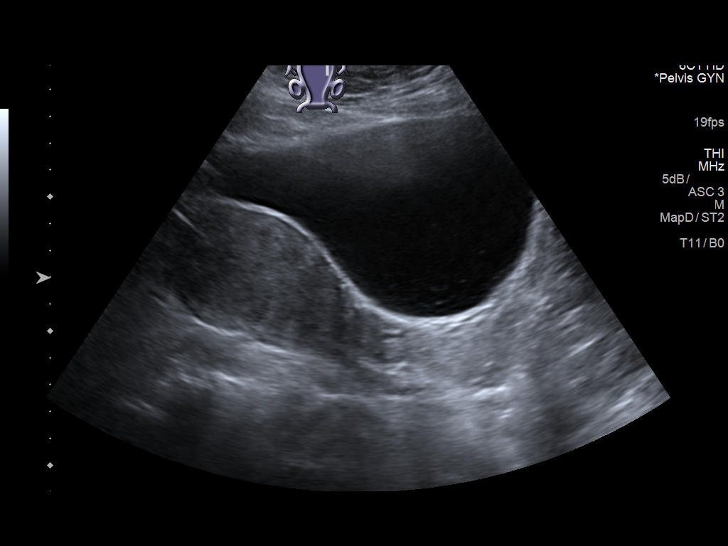
[im 13/52]
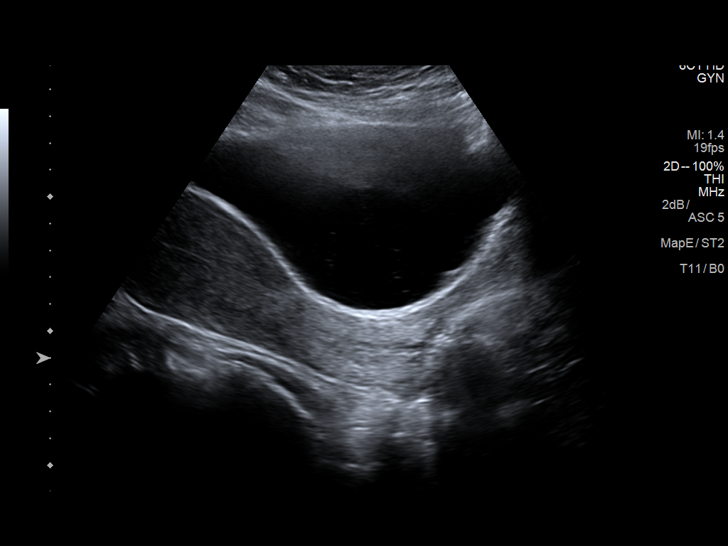
[im 18/52]
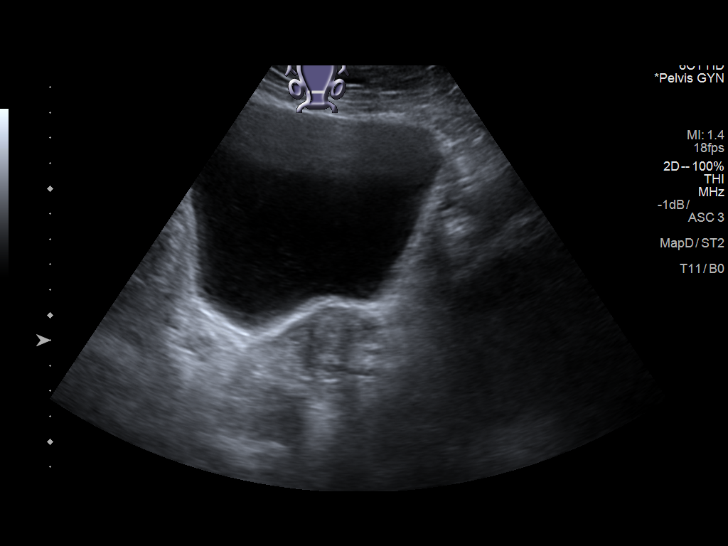
[im 20/52]
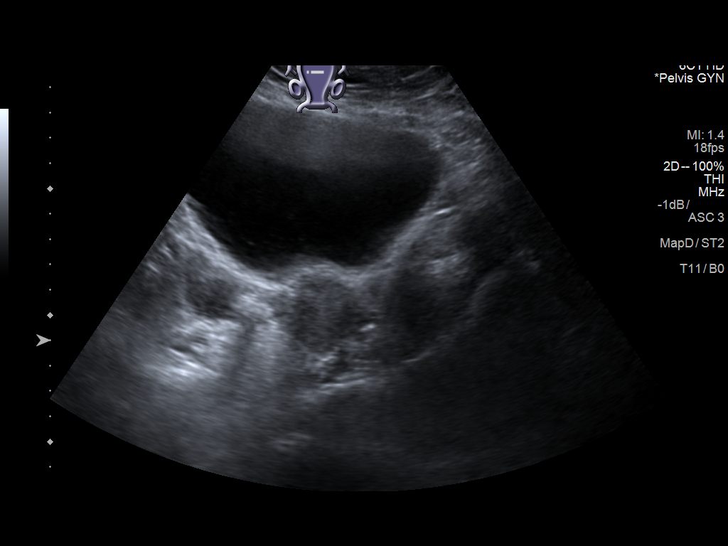
[im 24/52]
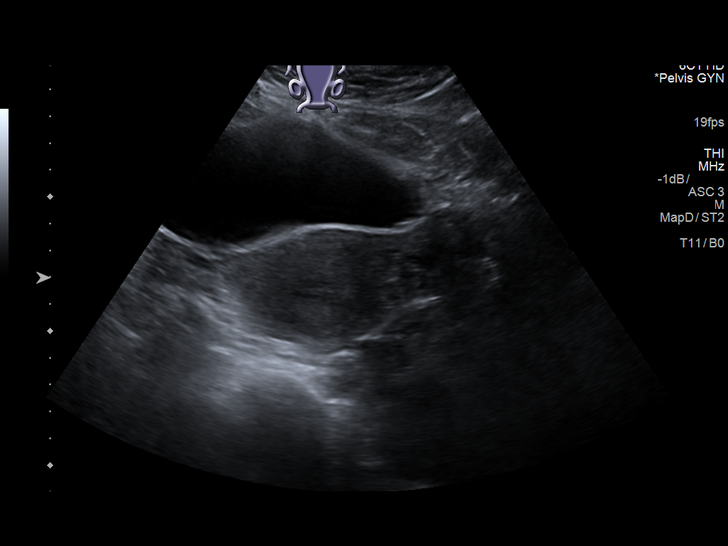
[im 28/52]
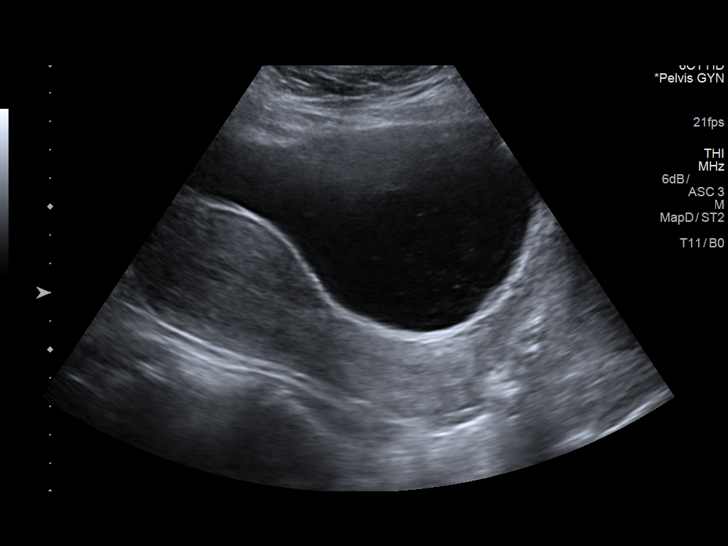
[im 32/52]
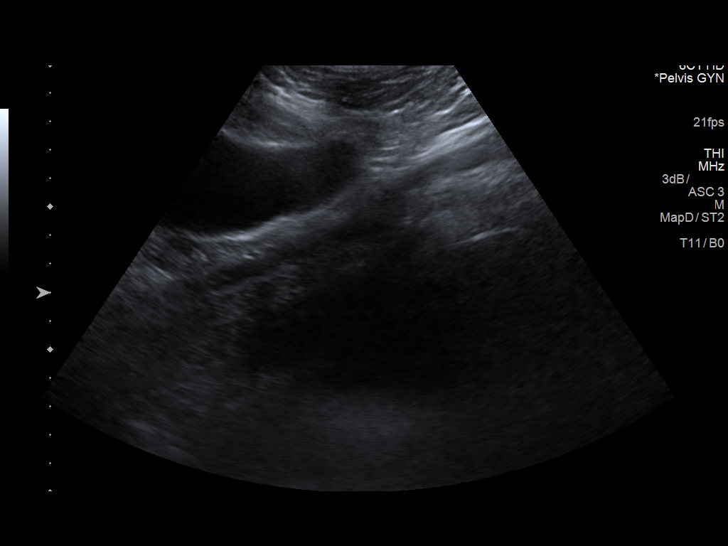
[im 35/52]
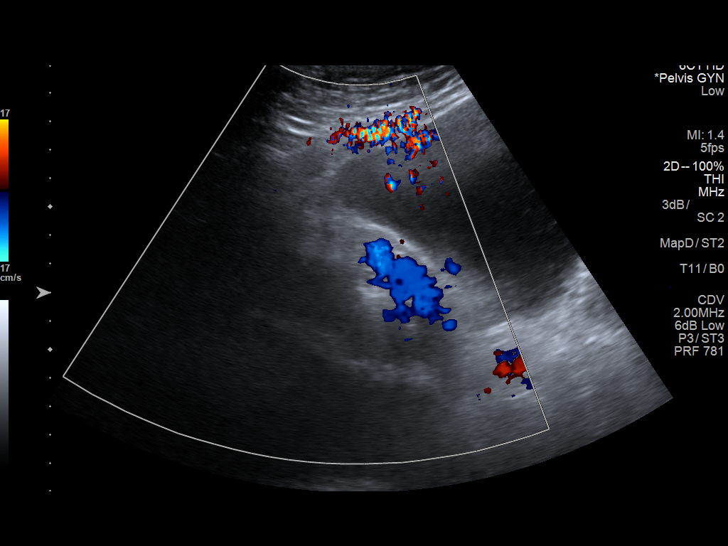
[im 39/52]
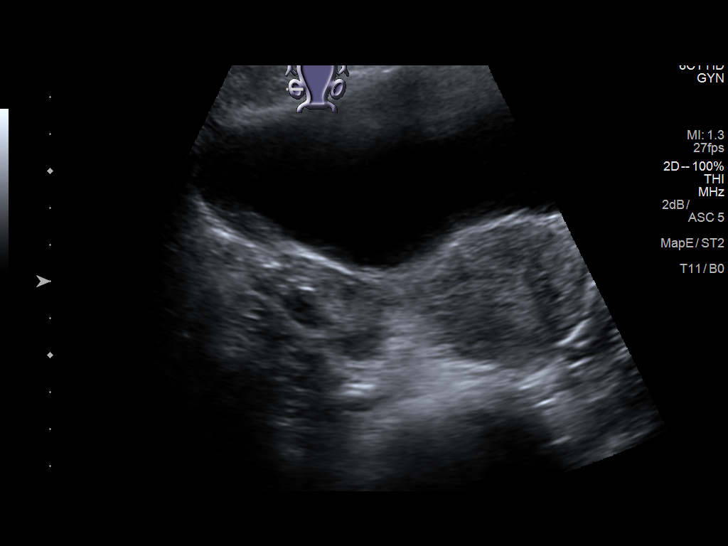
[im 43/52]
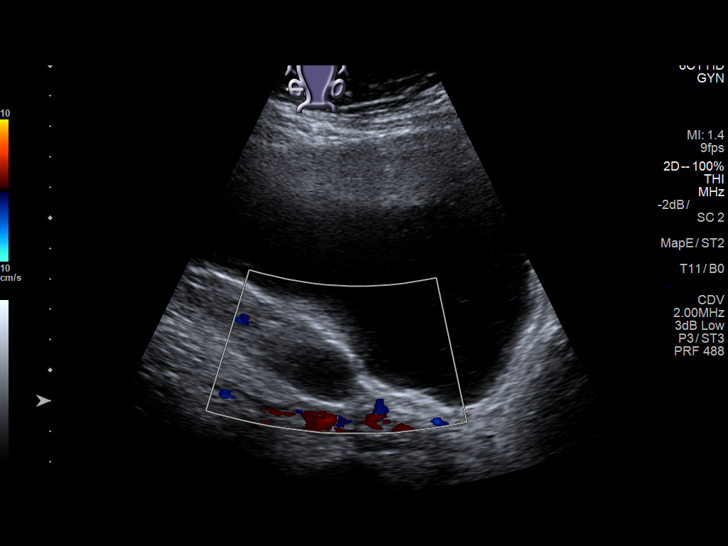
[im 47/52]
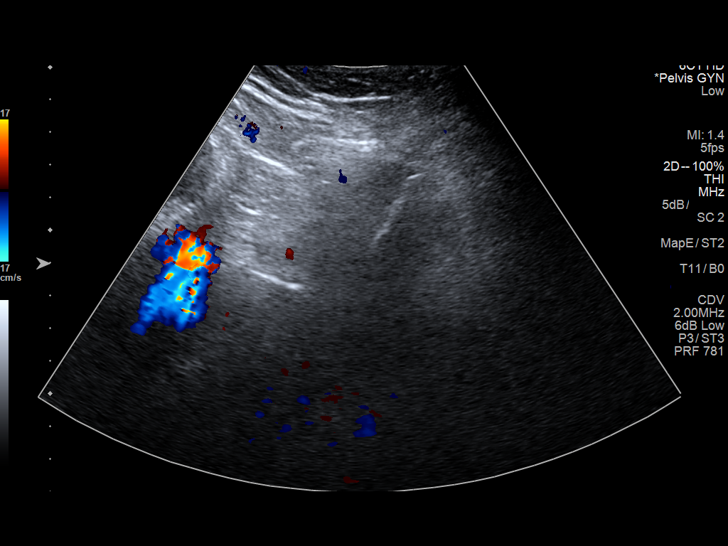
[im 52/52]
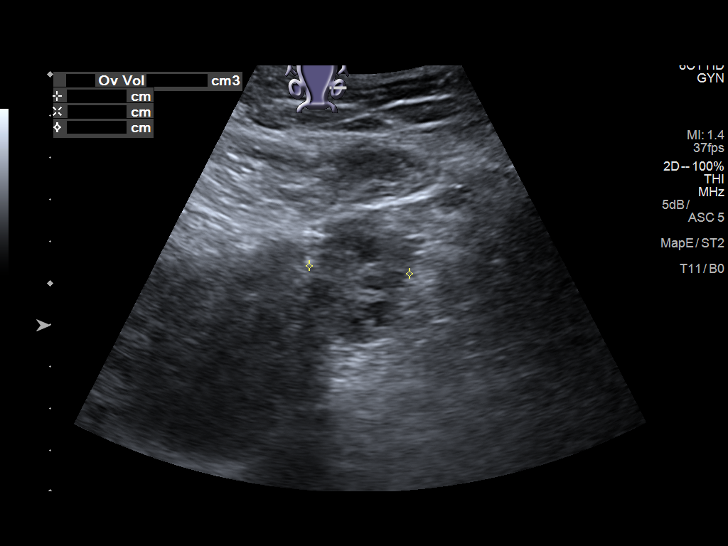

[14 of 25 positions shown; findings below may reference images not displayed]

FINDINGS: Uterus

Measurements: 12.9 x 4.7 x 6.6 cm = volume: 209 mL. No fibroids or
other mass visualized.

Endometrium

Thickness: 9 mm.  No focal abnormality visualized.

Right ovary

Measurements: 6.3 x 2.1 x 3.1 cm = volume: 21.9 mL. 2.1 x 1.7 x
cm simple cyst, most compatible with a normal physiologic follicular
cysts and/or dominant follicle. No internal vascularity or
significant complexity.

Left ovary

Measurements: 3.7 x 2.3 x 2.4 cm = volume: 11.0 mL. Normal
appearance/no adnexal mass.

Other findings:  No abnormal free fluid.
IMPRESSION: 1. 2.1 cm simple right ovarian cyst, likely reflecting a benign
physiologic follicular cyst and/or dominant follicle. This lesion
has benign characteristics, with no follow-up imaging recommended by
ultrasound.
2. Otherwise normal and unremarkable pelvic ultrasound.

## 2020-08-18 ENCOUNTER — Encounter: Payer: Self-pay | Admitting: Nurse Practitioner

## 2020-08-18 ENCOUNTER — Ambulatory Visit (INDEPENDENT_AMBULATORY_CARE_PROVIDER_SITE_OTHER): Payer: BC Managed Care – PPO | Admitting: Nurse Practitioner

## 2020-08-18 ENCOUNTER — Other Ambulatory Visit: Payer: Self-pay

## 2020-08-18 ENCOUNTER — Ambulatory Visit: Payer: Self-pay | Admitting: *Deleted

## 2020-08-18 VITALS — BP 137/82 | HR 84 | Temp 99.4°F | Wt 203.2 lb

## 2020-08-18 DIAGNOSIS — R42 Dizziness and giddiness: Secondary | ICD-10-CM

## 2020-08-18 DIAGNOSIS — D509 Iron deficiency anemia, unspecified: Secondary | ICD-10-CM

## 2020-08-18 DIAGNOSIS — F439 Reaction to severe stress, unspecified: Secondary | ICD-10-CM | POA: Diagnosis not present

## 2020-08-18 NOTE — Telephone Encounter (Signed)
Patient seen today in office

## 2020-08-18 NOTE — Progress Notes (Signed)
BP 137/82   Pulse 84   Temp 99.4 F (37.4 C) (Oral)   Wt 203 lb 3.2 oz (92.2 kg)   SpO2 97%   BMI 35.43 kg/m    Subjective:    Patient ID: Lynn Vargas, female    DOB: June 14, 1981, 40 y.o.   MRN: [id number]  HPI: Lynn Vargas is a 40 y.o. female  Chief Complaint  Patient presents with  . Dizziness    Pt states she has been having dizziness and fatigue since 07/28/20. States it comes and goes.    DIZZINESS Patient has been under a lot of stress with working full time and taking care of her 3 children.  She has been watching what she eats to try and lose weight.  Patient states her stool and periods have been different.  Duration: 1 month.  On and off. Description of symptoms: off kilter Duration of episode: seconds Dizziness frequency: recurrent Provoking factors: None Aggravating factors:  lack of sleep Triggered by rolling over in bed: no Triggered by bending over: no Aggravated by head movement: no Aggravated by exertion, coughing, loud noises: no Recent head injury: no Recent or current viral symptoms: no History of vasovagal episodes: no Nausea: no Vomiting: no Tinnitus: no Hearing loss: no Aural fullness: no Headache: no Photophobia/phonophobia: no Unsteady gait: yes Postural instability: no Diplopia, dysarthria, dysphagia or weakness: no Related to exertion: no Pallor: no Diaphoresis: no Dyspnea: no Chest pain: no  Relevant past medical, surgical, family and social history reviewed and updated as indicated. Interim medical history since our last visit reviewed. Allergies and medications reviewed and updated.  Review of Systems  Gastrointestinal:       Softer stools  Genitourinary:       Heavier periods  Neurological: Positive for dizziness.    Per HPI unless specifically indicated above     Objective:    BP 137/82   Pulse 84   Temp 99.4 F (37.4 C) (Oral)   Wt 203 lb 3.2 oz (92.2 kg)   SpO2 97%   BMI 35.43 kg/m   Wt Readings from  Last 3 Encounters:  08/18/20 203 lb 3.2 oz (92.2 kg)  01/16/20 194 lb (88 kg)  10/31/19 189 lb (85.7 kg)    Physical Exam Vitals and nursing note reviewed.  Constitutional:      General: She is not in acute distress.    Appearance: Normal appearance. She is normal weight. She is not ill-appearing, toxic-appearing or diaphoretic.  HENT:     Head: Normocephalic.     Right Ear: Tympanic membrane, ear canal and external ear normal.     Left Ear: Tympanic membrane, ear canal and external ear normal.     Nose: Nose normal.     Mouth/Throat:     Mouth: Mucous membranes are moist.     Pharynx: Oropharynx is clear.  Eyes:     General:        Right eye: No discharge.        Left eye: No discharge.     Extraocular Movements: Extraocular movements intact.     Conjunctiva/sclera: Conjunctivae normal.     Pupils: Pupils are equal, round, and reactive to light.  Cardiovascular:     Rate and Rhythm: Normal rate and regular rhythm.     Heart sounds: No murmur heard.   Pulmonary:     Effort: Pulmonary effort is normal. No respiratory distress.     Breath sounds: Normal breath sounds. No wheezing or  rales.  Musculoskeletal:     Cervical back: Normal range of motion and neck supple.  Skin:    General: Skin is warm and dry.     Capillary Refill: Capillary refill takes less than 2 seconds.  Neurological:     General: No focal deficit present.     Mental Status: She is alert and oriented to person, place, and time. Mental status is at baseline.     Cranial Nerves: No cranial nerve deficit.  Psychiatric:        Mood and Affect: Mood normal.        Behavior: Behavior normal.        Thought Content: Thought content normal.        Judgment: Judgment normal.     Results for orders placed or performed during the hospital encounter of 04/02/20  Novel Coronavirus, NAA ([facility])   Specimen: Nasal Swab; Nasopharyngeal(NP) swabs in vial transport medium   Nasopharynge  Result Value Ref Range    SARS-CoV-2, NAA Not Detected Not Detected  SARS-COV-2, NAA 2 DAY TAT   Nasopharynge  Result Value Ref Range   SARS-CoV-2, NAA 2 DAY TAT Performed       Assessment & Plan:   Problem List Items Addressed This Visit      Other   Anemia   Relevant Orders   Anemia Profile B    Other Visit Diagnoses    Dizziness    -  Primary   Labs ordered. Counseled patient extensively on eating properly and increasing water intake.  Discussed how diet changes have an effect on stool and hydration.   Relevant Orders   Comp Met (CMET)   Anemia Profile B   Stress       Recommend patient see a counselor to help with stress relief.  Discussed the importance of stress relief in overall physical health.    Relevant Orders   Ambulatory referral to Psychiatry       Follow up plan: Return in about 1 month (around 09/15/2020) for Follow up on dizziness.    A total of 30 minutes spent on this encounter.  This include before, face to face time, and after the encounter.

## 2020-08-18 NOTE — Telephone Encounter (Signed)
Pt called with complaints of dizziness that started around 07/28/20; she felt dizziness when she stretched; she almost fell early last week due to dizzy; she woke up dizzy this morning and she is not sure if it is fatigue; she is having neck discomfort also but is not sure if the discomfort is due to how she slept; the pt says she started having dizziness after her COVID shot and her body is not the same; the pt says she are mot brown as usual and she does not feel that she is emptying completely; the pt is having tingling in her left fingers; recommendations made per nurse triage protocol; she verbalized understanding decision tree completed; pt offered and accepted appt with Jon Billings, [place] 08/18/20 at 1040; will route to office for notification of upcoming appt.  Reason for Disposition . [1] MODERATE dizziness (e.g., interferes with normal activities) AND [2] has NOT been evaluated by physician for this  (Exception: dizziness caused by heat exposure, sudden standing, or poor fluid intake)  Answer Assessment - Initial Assessment Questions 1. DESCRIPTION: "Describe your dizziness."     Light headed 2. LIGHTHEADED: "Do you feel lightheaded?" (e.g., somewhat faint, woozy, weak upon standing)    Intermittent: one time stemmed from back of neck, one time when trying to put shoes on; this am when standing 3. VERTIGO: "Do you feel like either you or the room is spinning or tilting?" (i.e. vertigo)    no 4. SEVERITY: "How bad is it?"  "Do you feel like you are going to faint?" "Can you stand and walk?"   - MILD: Feels slightly dizzy, but walking normally.   - MODERATE: Feels very unsteady when walking, but not falling; interferes with normal activities (e.g., school, work) .   - SEVERE: Unable to walk without falling, or requires assistance to walk without falling; feels like passing out now.      moderate 5. ONSET:  "When did the dizziness begin?"     Around 07/28/20 6.  AGGRAVATING FACTORS: "Does anything make it worse?" (e.g., standing, change in head position)     Pt is not sure 7. HEART RATE: "Can you tell me your heart rate?" "How many beats in 15 seconds?"  (Note: not all patients can do this)       HR 89 of fitbit 8. CAUSE: "What do you think is causing the dizziness?"   Not sure, ? exhaustion 9. RECURRENT SYMPTOM: "Have you had dizziness before?" If Yes, ask: "When was the last time?" "What happened that time?"     "I think I have" but not sure when 10. OTHER SYMPTOMS: "Do you have any other symptoms?" (e.g., fever, chest pain, vomiting, diarrhea, bleeding)   Intermittent neck pain x 1 episode; "period was supposed to start on 08/17/20"; started 2-3 days late in Jan 11. PREGNANCY: "Is there any chance you are pregnant?" "When was your last menstrual period?"       LMP Jan 2022, not sure of date  Protocols used: DIZZINESS [facility]

## 2020-08-19 LAB — COMPREHENSIVE METABOLIC PANEL
ALT: 15 IU/L (ref 0–32)
AST: 15 IU/L (ref 0–40)
Albumin/Globulin Ratio: 1.8 (ref 1.2–2.2)
Albumin: 4.4 g/dL (ref 3.8–4.8)
Alkaline Phosphatase: 43 IU/L — ABNORMAL LOW (ref 44–121)
BUN/Creatinine Ratio: 19 (ref 9–23)
BUN: 15 mg/dL (ref 6–24)
Bilirubin Total: 0.5 mg/dL (ref 0.0–1.2)
CO2: 21 mmol/L (ref 20–29)
Calcium: 9.6 mg/dL (ref 8.7–10.2)
Chloride: 104 mmol/L (ref 96–106)
Creatinine, Ser: 0.81 mg/dL (ref 0.57–1.00)
GFR calc Af Amer: 105 mL/min/{1.73_m2} (ref >59)
GFR calc non Af Amer: 91 mL/min/{1.73_m2} (ref >59)
Globulin, Total: 2.5 g/dL (ref 1.5–4.5)
Glucose: 98 mg/dL (ref 65–99)
Potassium: 4.3 mmol/L (ref 3.5–5.2)
Sodium: 138 mmol/L (ref 134–144)
Total Protein: 6.9 g/dL (ref 6.0–8.5)

## 2020-08-19 LAB — ANEMIA PROFILE B
Basophils Absolute: 0 10*3/uL (ref 0.0–0.2)
Basos: 1 %
EOS (ABSOLUTE): 0.2 10*3/uL (ref 0.0–0.4)
Eos: 3 %
Ferritin: 35 ng/mL (ref 15–150)
Folate: 15.6 ng/mL (ref >3.0)
Hematocrit: 39.3 % (ref 34.0–46.6)
Hemoglobin: 13.2 g/dL (ref 11.1–15.9)
Immature Grans (Abs): 0 10*3/uL (ref 0.0–0.1)
Immature Granulocytes: 0 %
Iron Saturation: 56 % — ABNORMAL HIGH (ref 15–55)
Iron: 202 ug/dL — ABNORMAL HIGH (ref 27–159)
Lymphocytes Absolute: 1.5 10*3/uL (ref 0.7–3.1)
Lymphs: 31 %
MCH: 29.9 pg (ref 26.6–33.0)
MCHC: 33.6 g/dL (ref 31.5–35.7)
MCV: 89 fL (ref 79–97)
Monocytes Absolute: 0.5 10*3/uL (ref 0.1–0.9)
Monocytes: 10 %
Neutrophils Absolute: 2.6 10*3/uL (ref 1.4–7.0)
Neutrophils: 55 %
Platelets: 227 10*3/uL (ref 150–450)
RBC: 4.42 x10E6/uL (ref 3.77–5.28)
RDW: 13 % (ref 11.7–15.4)
Retic Ct Pct: 1.9 % (ref 0.6–2.6)
Total Iron Binding Capacity: 359 ug/dL (ref 250–450)
UIBC: 157 ug/dL (ref 131–425)
Vitamin B-12: 831 pg/mL (ref 232–1245)
WBC: 4.8 10*3/uL (ref 3.4–10.8)

## 2020-08-19 NOTE — Progress Notes (Signed)
Hi Lynn Vargas.  It was a pleasure meeting you yesterday.  I reviewed your lab work.  It does not reveal a reason for your symptoms.  Continue with the plan as discussed during visit.  We will reevaluate in one month.  I look forward to seeing you then.

## 2020-09-15 ENCOUNTER — Ambulatory Visit: Payer: BC Managed Care – PPO | Admitting: Nurse Practitioner

## 2020-09-18 ENCOUNTER — Encounter: Payer: Self-pay | Admitting: Nurse Practitioner

## 2020-09-18 DIAGNOSIS — R42 Dizziness and giddiness: Secondary | ICD-10-CM | POA: Insufficient documentation

## 2020-09-18 DIAGNOSIS — E7800 Pure hypercholesterolemia, unspecified: Secondary | ICD-10-CM | POA: Insufficient documentation

## 2020-09-18 DIAGNOSIS — E559 Vitamin D deficiency, unspecified: Secondary | ICD-10-CM | POA: Insufficient documentation

## 2020-09-22 ENCOUNTER — Ambulatory Visit: Payer: BC Managed Care – PPO | Admitting: Nurse Practitioner

## 2020-10-11 ENCOUNTER — Ambulatory Visit: Payer: BC Managed Care – PPO | Admitting: Nurse Practitioner

## 2020-10-14 ENCOUNTER — Encounter: Payer: Self-pay | Admitting: Nurse Practitioner

## 2020-10-14 ENCOUNTER — Other Ambulatory Visit: Payer: Self-pay

## 2020-10-14 ENCOUNTER — Ambulatory Visit: Payer: BC Managed Care – PPO | Admitting: Nurse Practitioner

## 2020-10-14 VITALS — BP 127/85 | HR 74 | Temp 99.4°F | Wt 206.8 lb

## 2020-10-14 DIAGNOSIS — F330 Major depressive disorder, recurrent, mild: Secondary | ICD-10-CM | POA: Diagnosis not present

## 2020-10-14 DIAGNOSIS — F419 Anxiety disorder, unspecified: Secondary | ICD-10-CM

## 2020-10-14 MED ORDER — BUPROPION HCL ER (XL) 150 MG PO TB24
150.0000 mg | ORAL_TABLET | Freq: Every day | ORAL | Status: DC
Start: 2020-10-14 — End: 2020-12-08

## 2020-10-14 NOTE — Progress Notes (Signed)
Acute Office Visit  Subjective:    Patient ID: Lynn Vargas, female    DOB: 07-21-1980, 40 y.o.   MRN: [id number]  Chief Complaint  Patient presents with  . Depression    Pt states she would like to start back on depression medication, states she has been feeling down and lethargic lately     HPI Patient is in today for depression and anxiety. She has a history of depression and anxiety and was taking wellbutrin xl and buspar, however her symptoms had improved and she weaned herself off the medications. She has been having trouble with irritability and depression. She has been having issues with her teenage son.   DEPRESSION  Mood status: exacerbated Satisfied with current treatment?: no Symptom severity: severe  Medication compliance: n/a Psychotherapy/counseling: yes in the past Previous psychiatric medications: buspar and wellbutrin Depressed mood: yes Anxious mood: yes Anhedonia: yes Significant weight loss or gain: yes Insomnia: no  Fatigue: yes Feelings of worthlessness or guilt: yes Impaired concentration/indecisiveness: no Suicidal ideations: no Hopelessness: no Crying spells: yes Depression screen [facility] 2/9 10/14/2020 07/31/2018 03/15/2018 09/07/2017 02/05/2017  Decreased Interest 3 3 1  0 0  Down, Depressed, Hopeless 3 3 2  0 -  PHQ - 2 Score 6 6 3  0 0  Altered sleeping 3 1 0 3 -  Tired, decreased energy 3 2 3 1  -  Change in appetite 3 3 3 1  -  Feeling bad or failure about yourself  3 0 0 0 -  Trouble concentrating 0 1 0 0 -  Moving slowly or fidgety/restless 0 0 0 0 -  Suicidal thoughts 0 0 0 0 -  PHQ-9 Score 18 13 9 5  -  Difficult doing work/chores Very difficult - - - -   ANXIETY/STRESS   Duration:worse Anxious mood: yes  Excessive worrying: yes Irritability: yes  Sweating: no Nausea: no Palpitations:no Hyperventilation: no Panic attacks: no Agoraphobia: no  Obscessions/compulsions: no Weight changes: yes Insomnia: no  Hypersomnia:  yes Fatigue/loss of energy: yes Recent Stressors/Life Changes: yes   Relationship problems: no   Family stress: yes     Financial stress: no    Job stress: no    Recent death/loss: no  GAD 7 : Generalized Anxiety Score 10/14/2020 07/31/2018 03/15/2018  Nervous, Anxious, on Edge 3 1 3   Control/stop worrying 3 3 0  Worry too much - different things 3 2 2   Trouble relaxing 3 3 1   Restless 0 1 0  Easily annoyed or irritable 3 3 3   Afraid - awful might happen 2 0 0  Total GAD 7 Score 17 13 9   Anxiety Difficulty Extremely difficult - Not difficult at all     Past Medical History:  Diagnosis Date  . Anemia   . Anxiety   . Cyst of perianal area    removed  . Depression   . 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 Gestational diabetes    diet controlled  . 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  . Hypertension    after pregnancy  . Shortness of breath   . UTI (lower urinary tract infection)     Past Surgical History:  Procedure Laterality Date  . CESAREAN SECTION    . Pionidal cyst      Family History  Problem Relation Age of Onset  . Asthma Mother   . Cancer Daughter  placenta cancer  . Diabetes Maternal Aunt   . Heart disease Maternal Grandmother   . Stroke Neg Hx     Social History   Socioeconomic History  . Marital status: Single    Spouse name: Not on file  . Number of children: Not on file  . Years of education: Not on file  . Highest education level: Not on file  Occupational History  . Not on file  Tobacco Use  . Smoking status: Never Smoker  . Smokeless tobacco: Never Used  Vaping Use  . Vaping Use: Never used  Substance and Sexual Activity  . Alcohol use: No  . Drug use: No  . Sexual activity: Not Currently  Other Topics Concern  . Not on file  Social History Narrative  . Not on file   Social Determinants of Health   Financial  Resource Strain: Not on file  Food Insecurity: Not on file  Transportation Needs: Not on file  Physical Activity: Not on file  Stress: Not on file  Social Connections: Not on file  Intimate Partner Violence: Not on file    Outpatient Medications Prior to Visit  Medication Sig Dispense Refill  . cetirizine (ZYRTEC) 10 MG tablet Take 10 mg by mouth daily.    Marland Kitchen doxycycline (VIBRA-TABS) 100 MG tablet Take 100 mg by mouth 2 (two) times daily.    . valACYclovir (VALTREX) 1000 MG tablet TAKE 1 TABLET (1,000 MG TOTAL) BY MOUTH DAILY. 90 tablet 0   No facility-administered medications prior to visit.    Allergies  Allergen Reactions  . Latex     rash    Review of Systems  Constitutional: Positive for fatigue.  HENT: Positive for congestion.   Respiratory: Negative.   Cardiovascular: Negative.   Gastrointestinal: Negative.   Skin: Negative.   Neurological: Negative.   Psychiatric/Behavioral: Negative for suicidal ideas. The patient is nervous/anxious.        Objective:    Physical Exam Vitals and nursing note reviewed.  Constitutional:      General: She is not in acute distress.    Appearance: Normal appearance.  HENT:     Head: Normocephalic.  Eyes:     Conjunctiva/sclera: Conjunctivae normal.  Cardiovascular:     Rate and Rhythm: Normal rate and regular rhythm.     Pulses: Normal pulses.     Heart sounds: Normal heart sounds.  Pulmonary:     Effort: Pulmonary effort is normal.     Breath sounds: Normal breath sounds.  Musculoskeletal:     Cervical back: Normal range of motion.  Skin:    General: Skin is warm.  Neurological:     General: No focal deficit present.     Mental Status: She is alert and oriented to person, place, and time.  Psychiatric:        Attention and Perception: Attention normal.        Mood and Affect: Mood is depressed. Affect is tearful.        Behavior: Behavior is cooperative.        Thought Content: Thought content normal.         Judgment: Judgment normal.     BP 127/85   Pulse 74   Temp 99.4 F (37.4 C) (Oral)   Wt 206 lb 12.8 oz (93.8 kg)   LMP 09/20/2020 (Approximate)   SpO2 98%   BMI 36.06 kg/m  Wt Readings from Last 3 Encounters:  10/14/20 206 lb 12.8 oz (93.8 kg)  08/18/20 203  lb 3.2 oz (92.2 kg)  01/16/20 194 lb (88 kg)    Health Maintenance Due  Topic Date Due  . PAP SMEAR-Modifier  08/29/2019  . COVID-19 Vaccine (2 - Moderna 3-dose series) 08/18/2020    There are no preventive care reminders to display for this patient.   Lab Results  Component Value Date   TSH 0.896 10/31/2019   Lab Results  Component Value Date   WBC 4.8 08/18/2020   HGB 13.2 08/18/2020   HCT 39.3 08/18/2020   MCV 89 08/18/2020   PLT 227 08/18/2020   Lab Results  Component Value Date   NA 138 08/18/2020   K 4.3 08/18/2020   CO2 21 08/18/2020   GLUCOSE 98 08/18/2020   BUN 15 08/18/2020   CREATININE 0.81 08/18/2020   BILITOT 0.5 08/18/2020   ALKPHOS 43 (L) 08/18/2020   AST 15 08/18/2020   ALT 15 08/18/2020   PROT 6.9 08/18/2020   ALBUMIN 4.4 08/18/2020   CALCIUM 9.6 08/18/2020   ANIONGAP 7 08/04/2012   Lab Results  Component Value Date   CHOL 195 10/31/2019   Lab Results  Component Value Date   HDL 67 10/31/2019   Lab Results  Component Value Date   LDLCALC 117 (H) 10/31/2019   Lab Results  Component Value Date   TRIG 58 10/31/2019    Lab Results  Component Value Date   HGBA1C 5.1 03/29/2017       Assessment & Plan:   Problem List Items Addressed This Visit      Other   Depression - Primary    PHQ9 score today is 18. Says her mood is worse and having a lot of irritability. Denies suicidal ideation. Has tolerated wellbutrin and buspar well in the past. Will start wellbutrin xl 150 mg daily. Hard script given for #30/2 refills. She has been playing phone tag with trying to set up an appointment for therapy. Encouraged her to reach back out. Discussed calming and de-stressing  techniques. Follow-up in 4 weeks or sooner if needed.       Relevant Medications   buPROPion (WELLBUTRIN XL) 150 MG 24 hr tablet   Anxiety    GAD 7 score is 17. She denies panic attacks, however is worrying frequently and irritable. Re-starting wellbutrin xl since she tolerated it well in the past. Can consider starting buspar vs abilify in the future if wellbutrin doesn't fully control symptoms. Follow-up in 4 weeks.       Relevant Medications   buPROPion (WELLBUTRIN XL) 150 MG 24 hr tablet       Meds ordered this encounter  Medications  . buPROPion (WELLBUTRIN XL) 150 MG 24 hr tablet    Sig: Take 1 tablet (150 mg total) by mouth daily.     Charyl Dancer, NP

## 2020-10-14 NOTE — Patient Instructions (Signed)

## 2020-10-14 NOTE — Assessment & Plan Note (Signed)
GAD 7 score is 17. She denies panic attacks, however is worrying frequently and irritable. Re-starting wellbutrin xl since she tolerated it well in the past. Can consider starting buspar vs abilify in the future if wellbutrin doesn't fully control symptoms. Follow-up in 4 weeks.

## 2020-10-14 NOTE — Assessment & Plan Note (Signed)
PHQ9 score today is 18. Says her mood is worse and having a lot of irritability. Denies suicidal ideation. Has tolerated wellbutrin and buspar well in the past. Will start wellbutrin xl 150 mg daily. Hard script given for #30/2 refills. She has been playing phone tag with trying to set up an appointment for therapy. Encouraged her to reach back out. Discussed calming and de-stressing techniques. Follow-up in 4 weeks or sooner if needed.

## 2020-10-20 ENCOUNTER — Telehealth: Payer: BC Managed Care – PPO | Admitting: Nurse Practitioner

## 2020-10-20 ENCOUNTER — Encounter: Payer: Self-pay | Admitting: Nurse Practitioner

## 2020-10-20 DIAGNOSIS — J3489 Other specified disorders of nose and nasal sinuses: Secondary | ICD-10-CM | POA: Diagnosis not present

## 2020-10-20 MED ORDER — AMOXICILLIN-POT CLAVULANATE 875-125 MG PO TABS: 1.0000 | ORAL_TABLET | Freq: Two times a day (BID) | ORAL | 0 refills | Status: AC

## 2020-10-20 MED ORDER — PREDNISONE 20 MG PO TABS
40.0000 mg | ORAL_TABLET | Freq: Every day | ORAL | 0 refills | Status: AC
Start: 2020-10-20 — End: 2020-10-25

## 2020-10-20 NOTE — Patient Instructions (Signed)
COVID-19: What Your Test Results Mean If you test positive for COVID-19 Take steps to protect others regardless of your COVID-19 vaccination status Stay home.  Isolate at home for at least 10 days. Stay in a specific room and away from other people in your home. Get rest and stay hydrated. If you develop symptoms, continue to isolate for at least 10 days after symptoms began and until you do not have a fever without using medications to reduce fever. Stay in touch with your doctor. Contact your doctor as soon as possible if you are an older adult or have underlying medical conditions. Contact your doctor or health department about isolation if you  Are severely ill or have a weakened immune system.  Had a positive test result followed by a negative result.  Test positive for many weeks. If you test negative for COVID-19:  The virus was not detected. If you have symptoms of COVID-19:  You may have received a false negative test result and still might have COVID-19.  Isolate from others. If you do not have symptoms of COVID-19 and you were exposed to a person with COVID-19:  You are likely not infected, but you still may get sick.  Contact your doctor about your symptoms, about follow-up testing, and how long to isolate.  Self-quarantine for 14 days at home after your exposure.  If you are fully vaccinated, you do not need to self quarantine.  Contact your doctor or local health department regarding options to reduce the length of your quarantine. A negative test result does not mean you won't get sick later. [email or web address] 04/06/2020 This information is not intended to replace advice given to you by your health care provider. Make sure you discuss any questions you have with your health care provider. Document Revised: 05/10/2020 Document Reviewed: 05/10/2020 Elsevier Patient Education  2021 [employer].

## 2020-10-20 NOTE — Assessment & Plan Note (Signed)
Acute with ongoing symptoms for over a week, returned recently on 10/15/20.  ?Covid, did test negative recently x 1.  Will retest today and recommend she self quarantine until results return.  If positive continue quarantine per CDC guidelines.  Due to ongoing symptoms and poor tolerance of Doxycyline will change to Augmentin for 7 days and add on Prednisone 40 MG daily x 5 days.  Recommend: - Increased rest - Increasing Fluids - Acetaminophen as needed for fever/pain.  - Salt water gargling, chloraseptic spray and throat lozenges - Mucinex.  - Humidifying the air Work note provided, return to office as schedule or if any worsening/ongoing symptoms.

## 2020-10-20 NOTE — Progress Notes (Signed)
LMP 09/20/2020 (Approximate)    Subjective:    Patient ID: Lynn Vargas, female    DOB: 1981-04-28, 40 y.o.   MRN: [id number]  HPI: Lynn Vargas is a 40 y.o. female  Chief Complaint  Patient presents with  . Headache  . Sinus Problem    Patient states her symptoms started Friday but this morning she woke up with severe headaches. Patient states she was diagnosed with a sinus infection and was given antibiotic and states it caused her to sleep a lot.     . This visit was completed via video visit through MyChart due to the restrictions of the COVID-19 pandemic. All issues as above were discussed and addressed. Physical exam was done as above through visual confirmation on video through MyChart. If it was felt that the patient should be evaluated in the office, they were directed there. The patient verbally consented to this visit. . Location of the patient: home . Location of the provider: home . Those involved with this call:  . Provider: Marnee Guarneri, DNP . CMA: Irena Reichmann, CMA . Front Desk/Registration: Jill Side  . Time spent on call: 21 minutes with patient face to face via video conference. More than 50% of this time was spent in counseling and coordination of care. 15 minutes total spent in review of patient's record and preparation of their chart.  . I verified patient identity using two factors (patient name and date of birth). Patient consents verbally to being seen via telemedicine visit today.    UPPER RESPIRATORY TRACT INFECTION Symptoms started Friday, 10/15/20.  Two weeks ago went to urgent care and had negative Covid, got abx and inhaler.  Through the whole week it made her tired.  On 10/14/20 started on Wellbutrin and stopped abx, as did not want to take the two together.  On Friday took abx and Zyrtec -- made her sleepy and stopped abx.  She has had one Covid vaccine.  Doxycyline was provided -- she reports this is causing some fatigue.   Covid vaccine x  1. Fever: no Cough: no Shortness of breath: a little bit in middle of night Wheezing: no Chest pain: no Chest tightness: no Chest congestion: no Nasal congestion: yes Runny nose: yes Post nasal drip: yes Sneezing: yes Sore throat: no Swollen glands: no Sinus pressure: yes Headache: yes Face pain: yes Toothache: yes Ear pain: none Ear pressure: none Eyes red/itching:no Eye drainage/crusting: no  Vomiting: no Rash: no Fatigue: yes Sick contacts: yes Strep contacts: no  Context: worse Recurrent sinusitis: no Relief with OTC cold/cough medications: no  Treatments attempted: cold/sinus and antibiotics   Relevant past medical, surgical, family and social history reviewed and updated as indicated. Interim medical history since our last visit reviewed. Allergies and medications reviewed and updated.  Review of Systems  Constitutional: Positive for fatigue. Negative for activity change, appetite change, diaphoresis and fever.  HENT: Positive for congestion, postnasal drip, rhinorrhea and sinus pressure. Negative for ear discharge, ear pain, facial swelling, sinus pain, sneezing, sore throat and voice change.   Eyes: Negative for pain and visual disturbance.  Respiratory: Positive for cough. Negative for chest tightness, shortness of breath and wheezing.   Cardiovascular: Negative for chest pain, palpitations and leg swelling.  Gastrointestinal: Negative.   Endocrine: Negative.   Musculoskeletal: Negative for myalgias.  Neurological: Positive for headaches. Negative for dizziness and numbness.  Psychiatric/Behavioral: Negative.     Per HPI unless specifically indicated above     Objective:  LMP 09/20/2020 (Approximate)   Wt Readings from Last 3 Encounters:  10/14/20 206 lb 12.8 oz (93.8 kg)  08/18/20 203 lb 3.2 oz (92.2 kg)  01/16/20 194 lb (88 kg)    Physical Exam Vitals and nursing note reviewed.  Constitutional:      General: She is awake. She is not in acute  distress.    Appearance: She is well-developed and well-groomed. She is not ill-appearing or toxic-appearing.  HENT:     Head: Normocephalic.     Right Ear: Hearing normal.     Left Ear: Hearing normal.  Eyes:     General: Lids are normal.        Right eye: No discharge.        Left eye: No discharge.     Conjunctiva/sclera: Conjunctivae normal.  Pulmonary:     Effort: Pulmonary effort is normal. No accessory muscle usage or respiratory distress.  Musculoskeletal:     Cervical back: Normal range of motion.  Neurological:     Mental Status: She is alert and oriented to person, place, and time.  Psychiatric:        Attention and Perception: Attention normal.        Mood and Affect: Mood normal.        Behavior: Behavior normal. Behavior is cooperative.        Thought Content: Thought content normal.        Judgment: Judgment normal.     Results for orders placed or performed in visit on 08/18/20  Comp Met (CMET)  Result Value Ref Range   Glucose 98 65 - 99 mg/dL   BUN 15 6 - 24 mg/dL   Creatinine, Ser 0.81 0.57 - 1.00 mg/dL   GFR calc non Af Amer 91 >59 mL/min/1.73   GFR calc Af Amer 105 >59 mL/min/1.73   BUN/Creatinine Ratio 19 9 - 23   Sodium 138 134 - 144 mmol/L   Potassium 4.3 3.5 - 5.2 mmol/L   Chloride 104 96 - 106 mmol/L   CO2 21 20 - 29 mmol/L   Calcium 9.6 8.7 - 10.2 mg/dL   Total Protein 6.9 6.0 - 8.5 g/dL   Albumin 4.4 3.8 - 4.8 g/dL   Globulin, Total 2.5 1.5 - 4.5 g/dL   Albumin/Globulin Ratio 1.8 1.2 - 2.2   Bilirubin Total 0.5 0.0 - 1.2 mg/dL   Alkaline Phosphatase 43 (L) 44 - 121 IU/L   AST 15 0 - 40 IU/L   ALT 15 0 - 32 IU/L  Anemia Profile B  Result Value Ref Range   Total Iron Binding Capacity 359 250 - 450 ug/dL   UIBC 157 131 - 425 ug/dL   Iron 202 (H) 27 - 159 ug/dL   Iron Saturation 56 (H) 15 - 55 %   Ferritin 35 15 - 150 ng/mL   Vitamin B-12 831 232 - 1,245 pg/mL   Folate 15.6 >3.0 ng/mL   WBC 4.8 3.4 - 10.8 x10E3/uL   RBC 4.42 3.77 -  5.28 x10E6/uL   Hemoglobin 13.2 11.1 - 15.9 g/dL   Hematocrit 39.3 34.0 - 46.6 %   MCV 89 79 - 97 fL   MCH 29.9 26.6 - 33.0 pg   MCHC 33.6 31.5 - 35.7 g/dL   RDW 13.0 11.7 - 15.4 %   Platelets 227 150 - 450 x10E3/uL   Neutrophils 55 Not Estab. %   Lymphs 31 Not Estab. %   Monocytes 10 Not Estab. %   Eos 3  Not Estab. %   Basos 1 Not Estab. %   Neutrophils Absolute 2.6 1.4 - 7.0 x10E3/uL   Lymphocytes Absolute 1.5 0.7 - 3.1 x10E3/uL   Monocytes Absolute 0.5 0.1 - 0.9 x10E3/uL   EOS (ABSOLUTE) 0.2 0.0 - 0.4 x10E3/uL   Basophils Absolute 0.0 0.0 - 0.2 x10E3/uL   Immature Granulocytes 0 Not Estab. %   Immature Grans (Abs) 0.0 0.0 - 0.1 x10E3/uL   Retic Ct Pct 1.9 0.6 - 2.6 %      Assessment & Plan:   Problem List Items Addressed This Visit      Other   Sinus pressure - Primary    Acute with ongoing symptoms for over a week, returned recently on 10/15/20.  ?Covid, did test negative recently x 1.  Will retest today and recommend she self quarantine until results return.  If positive continue quarantine per CDC guidelines.  Due to ongoing symptoms and poor tolerance of Doxycyline will change to Augmentin for 7 days and add on Prednisone 40 MG daily x 5 days.  Recommend: - Increased rest - Increasing Fluids - Acetaminophen as needed for fever/pain.  - Salt water gargling, chloraseptic spray and throat lozenges - Mucinex.  - Humidifying the air Work note provided, return to office as schedule or if any worsening/ongoing symptoms.      Relevant Orders   Novel Coronavirus, NAA ([facility])      I discussed the assessment and treatment plan with the patient. The patient was provided an opportunity to ask questions and all were answered. The patient agreed with the plan and demonstrated an understanding of the instructions.   The patient was advised to call back or seek an in-person evaluation if the symptoms worsen or if the condition fails to improve as anticipated.   I provided 21+  minutes of time during this encounter.  Follow up plan: Return if symptoms worsen or fail to improve.

## 2020-10-21 LAB — NOVEL CORONAVIRUS, NAA: SARS-CoV-2, NAA: NOT DETECTED

## 2020-10-21 LAB — SARS-COV-2, NAA 2 DAY TAT

## 2020-10-21 NOTE — Progress Notes (Signed)
Contacted via [place] afternoon [place], your covid test returned negative.  Great news!!  Continue to monitor symptoms and if any worsening or ongoing return to office.

## 2020-10-25 ENCOUNTER — Encounter: Payer: Self-pay | Admitting: Nurse Practitioner

## 2020-10-26 ENCOUNTER — Encounter: Payer: Self-pay | Admitting: Nurse Practitioner

## 2020-11-11 ENCOUNTER — Ambulatory Visit: Payer: BC Managed Care – PPO | Admitting: Nurse Practitioner

## 2020-11-18 ENCOUNTER — Telehealth: Payer: Self-pay

## 2020-11-18 NOTE — Telephone Encounter (Signed)
Patient was mailed new patient paperwork by psychiatry. Was she abel to get that filled out?

## 2020-11-18 NOTE — Telephone Encounter (Signed)
Patient states she has not filled out new patient packet since she has not gotten an appointment with them. Patient does not want to waste her time if she will not be able to get an appointment. Advised patient protocol may be to fill out packet first then get an appointment. Patient states when she has gotten a call from them she has bad connection and can't hear them. Patient also states she has tried to call them back and never gets an answer from them. During phone call connection dropped.

## 2020-11-18 NOTE — Telephone Encounter (Signed)
Pt was seen 2/9 per notes referred but I do not see it under referrals. Please advise   Copied from [place] [phone]. Topic: Referral - Status >> Nov 18, 2020 11:07 AM Alanda Slim E wrote: Reason for CRM: Pt stated she was called from her Therapy referral but had a bad connection and has trying to reach them with no success/ she asked if the office can help her reach them to be seen asap/ please advise / or she asked if she can get another referral to another location

## 2020-11-18 NOTE — Telephone Encounter (Signed)
Unfortunately, we do not have control over how they schedule.  If she would like to be seen she will have to fill out the new patient information first.

## 2020-11-19 NOTE — Telephone Encounter (Signed)
Called to discuss referral. Patient did not answer and no VM set up.

## 2020-12-08 ENCOUNTER — Other Ambulatory Visit: Payer: Self-pay

## 2020-12-08 MED ORDER — BUPROPION HCL ER (XL) 150 MG PO TB24: 150.0000 mg | ORAL_TABLET | Freq: Every day | ORAL | 1 refills | Status: DC

## 2020-12-08 NOTE — Telephone Encounter (Signed)
Routing to provider  

## 2021-01-03 ENCOUNTER — Ambulatory Visit: Payer: BC Managed Care – PPO | Admitting: Nurse Practitioner

## 2021-01-03 ENCOUNTER — Encounter: Payer: Self-pay | Admitting: Nurse Practitioner

## 2021-01-03 ENCOUNTER — Other Ambulatory Visit: Payer: Self-pay

## 2021-01-03 VITALS — BP 119/81 | HR 82 | Temp 99.1°F | Wt 207.8 lb

## 2021-01-03 DIAGNOSIS — R350 Frequency of micturition: Secondary | ICD-10-CM | POA: Diagnosis not present

## 2021-01-03 DIAGNOSIS — F330 Major depressive disorder, recurrent, mild: Secondary | ICD-10-CM

## 2021-01-03 DIAGNOSIS — N898 Other specified noninflammatory disorders of vagina: Secondary | ICD-10-CM

## 2021-01-03 DIAGNOSIS — E669 Obesity, unspecified: Secondary | ICD-10-CM | POA: Diagnosis not present

## 2021-01-03 LAB — URINALYSIS, ROUTINE W REFLEX MICROSCOPIC
Bilirubin, UA: NEGATIVE
Glucose, UA: NEGATIVE
Ketones, UA: NEGATIVE
Leukocytes,UA: NEGATIVE
Nitrite, UA: NEGATIVE
Protein,UA: NEGATIVE
Specific Gravity, UA: 1.015 (ref 1.005–1.030)
Urobilinogen, Ur: 0.2 mg/dL (ref 0.2–1.0)
pH, UA: 7 (ref 5.0–7.5)

## 2021-01-03 LAB — MICROSCOPIC EXAMINATION
Bacteria, UA: NONE SEEN
Epithelial Cells (non renal): NONE SEEN /hpf (ref 0–10)
WBC, UA: NONE SEEN /hpf (ref 0–5)

## 2021-01-03 LAB — WET PREP FOR TRICH, YEAST, CLUE
Clue Cell Exam: NEGATIVE
Trichomonas Exam: NEGATIVE
Yeast Exam: NEGATIVE

## 2021-01-03 MED ORDER — BUPROPION HCL ER (XL) 300 MG PO TB24: 300.0000 mg | ORAL_TABLET | Freq: Every day | ORAL | 1 refills | Status: DC

## 2021-01-03 MED ORDER — SAXENDA 18 MG/3ML ~~LOC~~ SOPN: 0.6000 mg | PEN_INJECTOR | Freq: Every day | SUBCUTANEOUS | 1 refills | Status: DC

## 2021-01-03 MED ORDER — METRONIDAZOLE 500 MG PO TABS: 500.0000 mg | ORAL_TABLET | Freq: Two times a day (BID) | ORAL | 0 refills | Status: AC

## 2021-01-03 MED ORDER — METRONIDAZOLE 500 MG PO TABS: 500.0000 mg | ORAL_TABLET | Freq: Two times a day (BID) | ORAL | 0 refills | Status: DC

## 2021-01-03 MED ORDER — METRONIDAZOLE 500 MG PO TABS
500.0000 mg | ORAL_TABLET | Freq: Two times a day (BID) | ORAL | 0 refills | Status: DC
Start: 2021-01-03 — End: 2021-01-03

## 2021-01-03 NOTE — Assessment & Plan Note (Signed)
Diet and exercise reviewed with patient today. Will begin Saxenda to help with weight loss.  Discussed proper use of medication. Discussed side effects and benefits of medication with patient during visit. Follow up in 1 month for reevaluation.

## 2021-01-03 NOTE — Patient Instructions (Signed)
Inject 0.6 mg into the skin daily. Inject 0.6mg  daily for 1 week, Inject 1.2mg  daily for 1 week, inject 2.4mg  daily for 1 week, inject 3mg  daily.  The goal is to be on 3mg  daily or the highest dose you are able to tolerate.  Please let me know if you have any questions!

## 2021-01-03 NOTE — Progress Notes (Signed)
BP 119/81   Pulse 82   Temp 99.1 F (37.3 C) (Oral)   Wt 207 lb 12.8 oz (94.3 kg)   SpO2 98%   BMI 36.23 kg/m    Subjective:    Patient ID: Lynn Vargas, female    DOB: 07/30/1980, 40 y.o.   MRN: [id number]  HPI: Lynn Vargas is a 40 y.o. female  Chief Complaint  Patient presents with   Urinary Tract Infection    Pt states she has been having frequent urination for the last few weeks    Depression    Pt states she feels like she needs to go back to the 300 mg Wellbutrin   URINARY SYMPTOMS Patient likes to take baths and thinks she has BV. Dysuria: no Urinary frequency: yes Urgency: yes Small volume voids: no Symptom severity: no Urinary incontinence: no Foul odor: yes Hematuria: no Abdominal pain: no Back pain: no Suprapubic pain/pressure: no Flank pain: no Fever:  no Vomiting: no Patient tried boric acid.  DEPRESSION Patient states she would like to increase her dose to Wellbutrin 300mg . She also feels like it helped her weight loss.  [place] Office Visit from 01/03/2021 in [place]  PHQ-9 Total Score 5       Relevant past medical, surgical, family and social history reviewed and updated as indicated. Interim medical history since our last visit reviewed. Allergies and medications reviewed and updated.  Review of Systems  Constitutional:  Positive for unexpected weight change. Negative for fever.  Gastrointestinal:  Negative for abdominal pain and vomiting.  Genitourinary:  Positive for frequency and urgency. Negative for decreased urine volume, dysuria, flank pain and hematuria.       Foul odor.  Musculoskeletal:  Negative for back pain.   Per HPI unless specifically indicated above     Objective:    BP 119/81   Pulse 82   Temp 99.1 F (37.3 C) (Oral)   Wt 207 lb 12.8 oz (94.3 kg)   SpO2 98%   BMI 36.23 kg/m   Wt Readings from Last 3 Encounters:  01/03/21 207 lb 12.8 oz (94.3 kg)  10/14/20 206 lb 12.8 oz (93.8  kg)  08/18/20 203 lb 3.2 oz (92.2 kg)    Physical Exam Vitals and nursing note reviewed.  Constitutional:      General: She is not in acute distress.    Appearance: Normal appearance. She is normal weight. She is not ill-appearing, toxic-appearing or diaphoretic.  HENT:     Head: Normocephalic.     Right Ear: External ear normal.     Left Ear: External ear normal.     Nose: Nose normal.     Mouth/Throat:     Mouth: Mucous membranes are moist.     Pharynx: Oropharynx is clear.  Eyes:     General:        Right eye: No discharge.        Left eye: No discharge.     Extraocular Movements: Extraocular movements intact.     Conjunctiva/sclera: Conjunctivae normal.     Pupils: Pupils are equal, round, and reactive to light.  Cardiovascular:     Rate and Rhythm: Normal rate and regular rhythm.     Heart sounds: No murmur heard. Pulmonary:     Effort: Pulmonary effort is normal. No respiratory distress.     Breath sounds: Normal breath sounds. No wheezing or rales.  Musculoskeletal:     Cervical back: Normal range of motion and  neck supple.  Skin:    General: Skin is warm and dry.     Capillary Refill: Capillary refill takes less than 2 seconds.  Neurological:     General: No focal deficit present.     Mental Status: She is alert and oriented to person, place, and time. Mental status is at baseline.  Psychiatric:        Mood and Affect: Mood normal.        Behavior: Behavior normal.        Thought Content: Thought content normal.        Judgment: Judgment normal.    Results for orders placed or performed in visit on 10/20/20  Novel Coronavirus, NAA ([facility])   Specimen: Nasopharyngeal(NP) swabs in vial transport medium  Result Value Ref Range   SARS-CoV-2, NAA Not Detected Not Detected  SARS-COV-2, NAA 2 DAY TAT  Result Value Ref Range   SARS-CoV-2, NAA 2 DAY TAT Performed       Assessment & Plan:   Problem List Items Addressed This Visit       Other   Depression     Chronic.  Increased Wellbutrin to 300mg  daily. Side effects and benefits of medication discussed with patient during visit today.  Return to clinic in 1 month for reevaluation.  Call sooner if concerns arise.        Relevant Medications   buPROPion (WELLBUTRIN XL) 300 MG 24 hr tablet   Obesity (BMI 35.0-39.9 without comorbidity)    Diet and exercise reviewed with patient today. Will begin Saxenda to help with weight loss.  Discussed proper use of medication. Discussed side effects and benefits of medication with patient during visit. Follow up in 1 month for reevaluation.        Relevant Medications   Liraglutide -Weight Management (SAXENDA) 18 MG/3ML SOPN   Other Visit Diagnoses     Urinary frequency    -  Primary   UA and wet prep were normal. Sent for culture to ensure no bacterial growth. Flagyl sent due to patient's symptoms and history of BV.   Relevant Orders   Urinalysis, Routine w reflex microscopic   Vaginal discharge       Patient treated for BV due to symptoms and history.   Relevant Orders   WET PREP FOR [place], YEAST, CLUE        Follow up plan: Return in 1 month (on 02/02/2021) for Depression/Anxiety FU, Weight Managment.

## 2021-01-03 NOTE — Progress Notes (Signed)
Results discussed with patient during office visit. Urine sent for culture. Will treat if needed.

## 2021-01-03 NOTE — Assessment & Plan Note (Signed)
Chronic.  Increased Wellbutrin to 300mg  daily. Side effects and benefits of medication discussed with patient during visit today.  Return to clinic in 1 month for reevaluation.  Call sooner if concerns arise.

## 2021-02-04 ENCOUNTER — Ambulatory Visit: Payer: BC Managed Care – PPO | Admitting: Nurse Practitioner

## 2021-04-20 ENCOUNTER — Other Ambulatory Visit: Payer: Self-pay

## 2021-04-20 ENCOUNTER — Ambulatory Visit: Payer: BC Managed Care – PPO | Admitting: Internal Medicine

## 2021-04-20 ENCOUNTER — Ambulatory Visit (INDEPENDENT_AMBULATORY_CARE_PROVIDER_SITE_OTHER): Payer: BC Managed Care – PPO | Admitting: Nurse Practitioner

## 2021-04-20 ENCOUNTER — Encounter: Payer: Self-pay | Admitting: Nurse Practitioner

## 2021-04-20 VITALS — BP 134/91 | HR 82 | Ht 63.5 in | Wt 207.0 lb

## 2021-04-20 DIAGNOSIS — M62838 Other muscle spasm: Secondary | ICD-10-CM

## 2021-04-20 IMAGING — US US PELVIS COMPLETE WITH TRANSVAGINAL
1 series · 14 of 25 positions shown · non-contrast
Comparison: Ultrasound 08/19/2018

CLINICAL DATA: Right-sided pelvic pain

EXAM:
TRANSABDOMINAL AND TRANSVAGINAL ULTRASOUND OF PELVIS
TECHNIQUE: Both transabdominal and transvaginal ultrasound examinations of the
pelvis were performed. Transabdominal technique was performed for
global imaging of the pelvis including uterus, ovaries, adnexal
regions, and pelvic cul-de-sac. It was necessary to proceed with
endovaginal exam following the transabdominal exam to visualize the
uterus endometrium ovaries.

[Series 1: us pelvic complete with transvaginal · 14 of 68 slices shown]
[im 1/68]
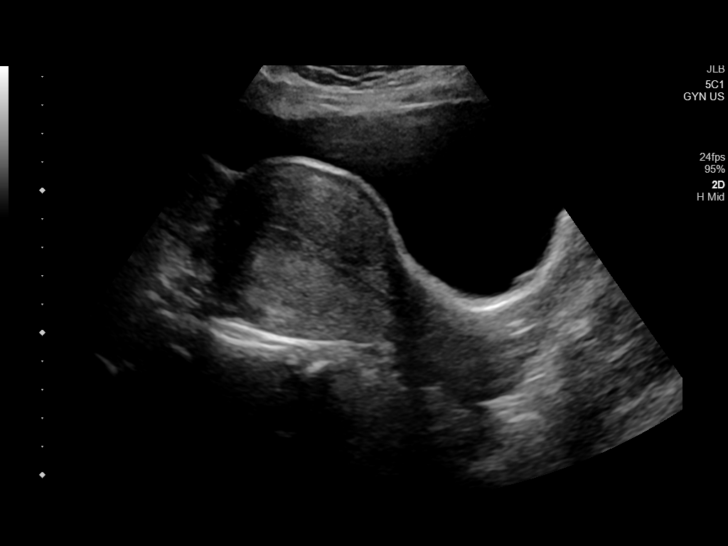
[im 6/68]
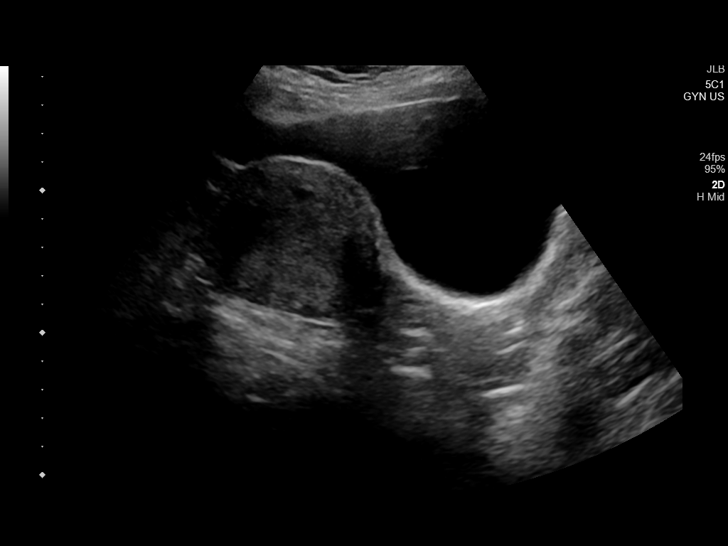
[im 12/68]
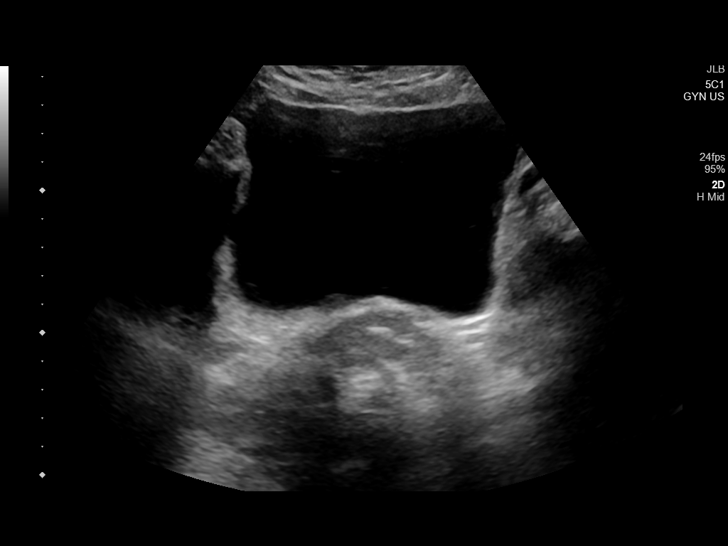
[im 17/68]
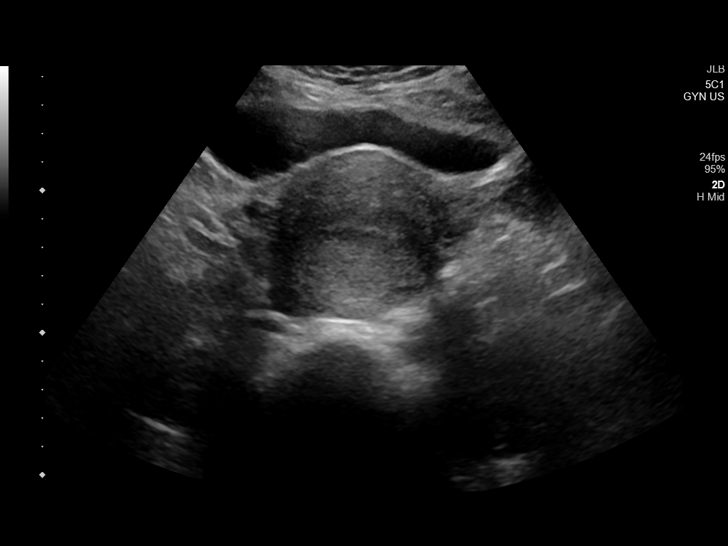
[im 23/68]
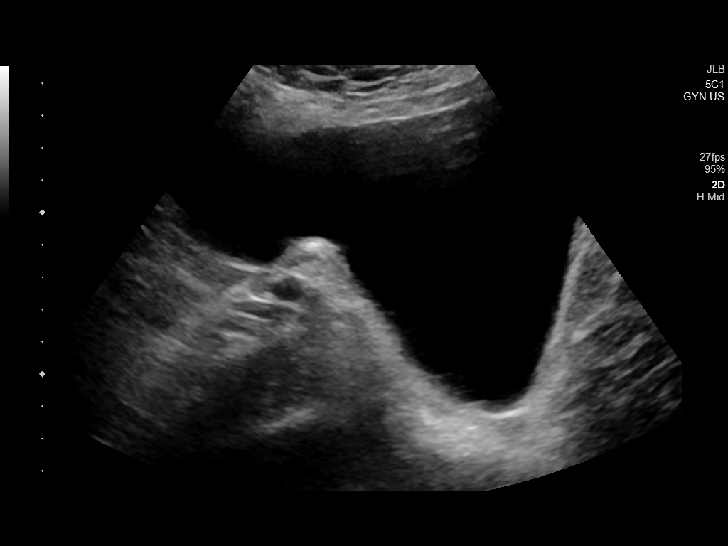
[im 26/68]
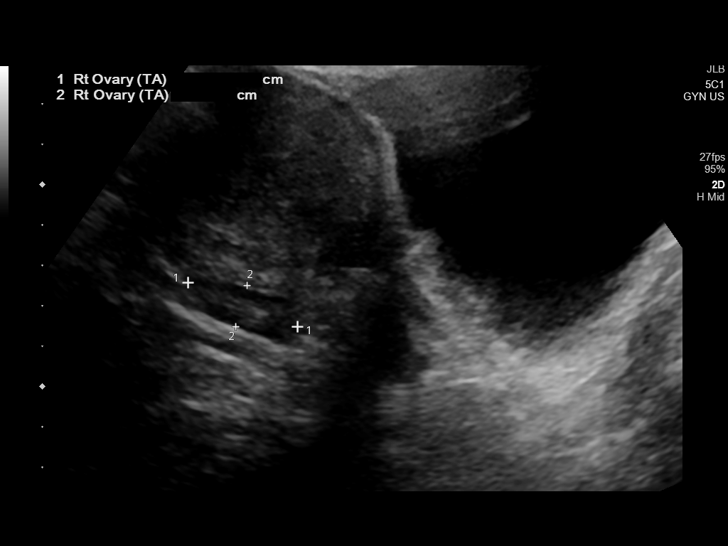
[im 31/68]
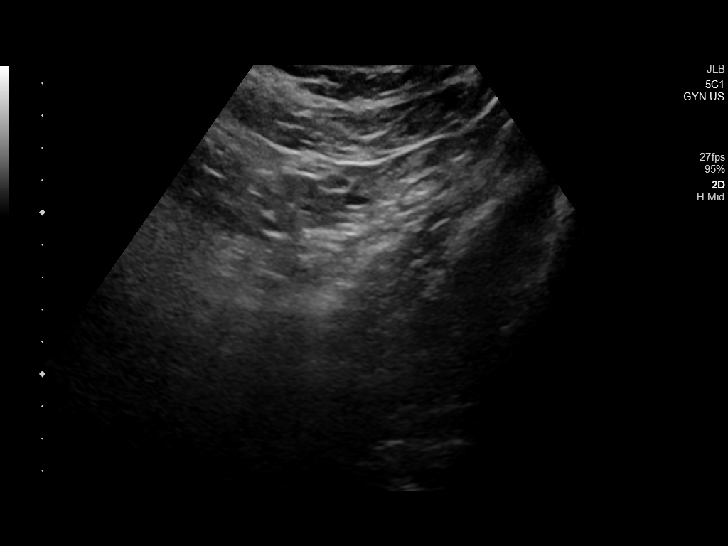
[im 37/68]
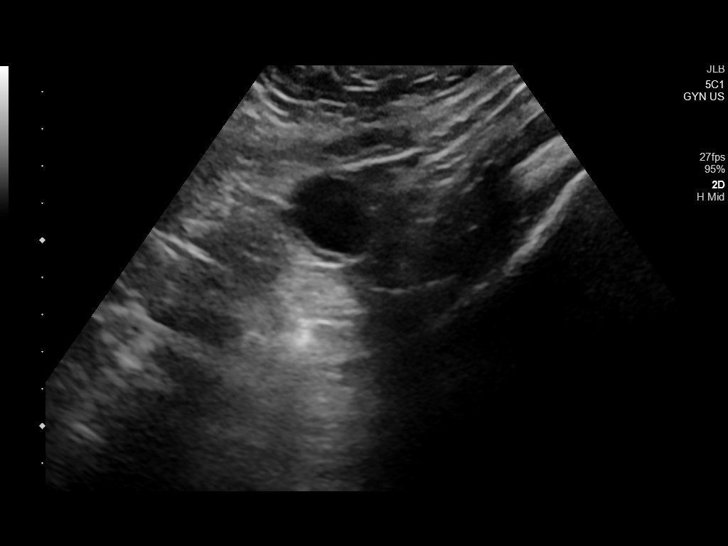
[im 42/68]
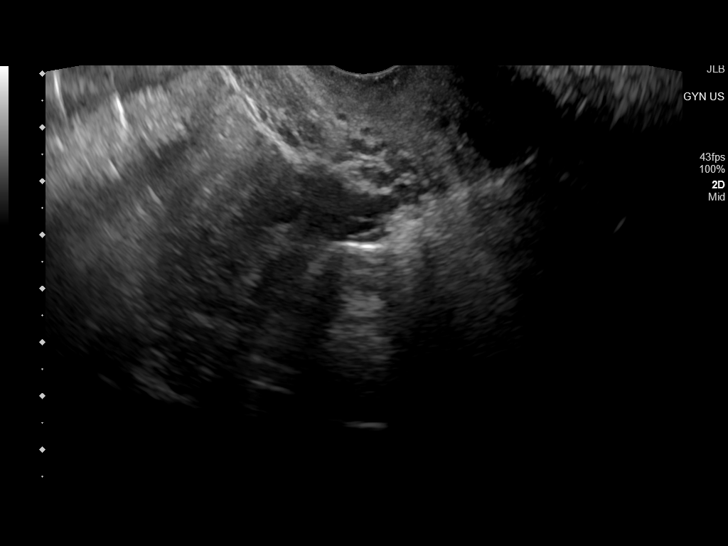
[im 45/68]
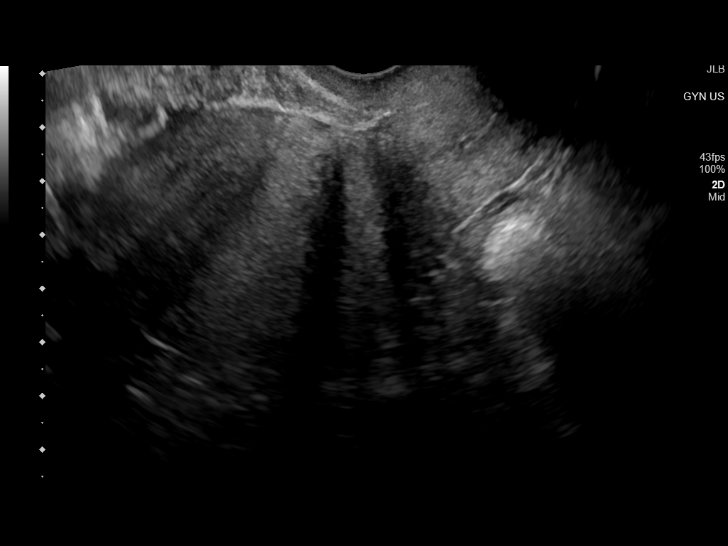
[im 51/68]
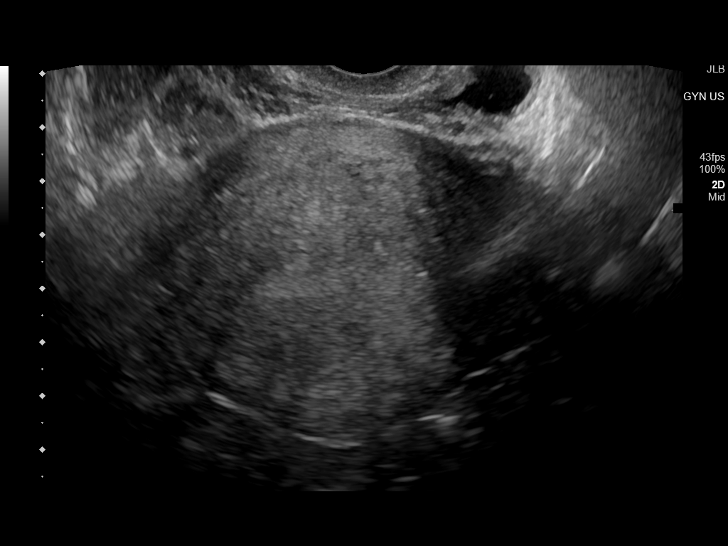
[im 56/68]
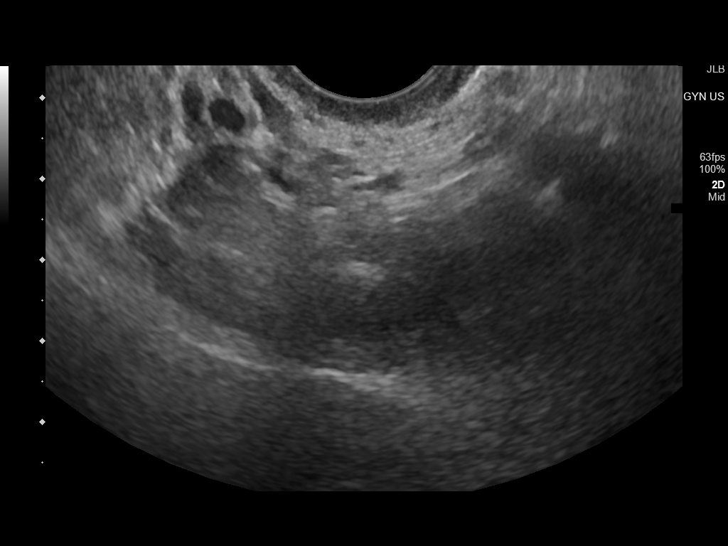
[im 62/68]
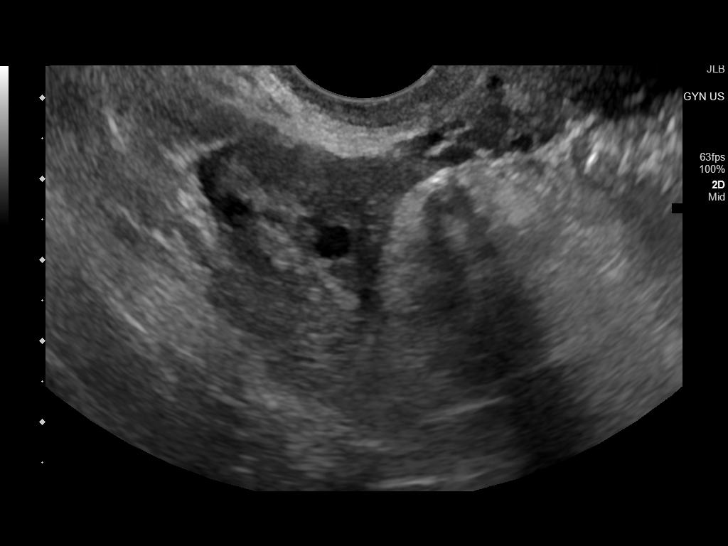
[im 68/68]
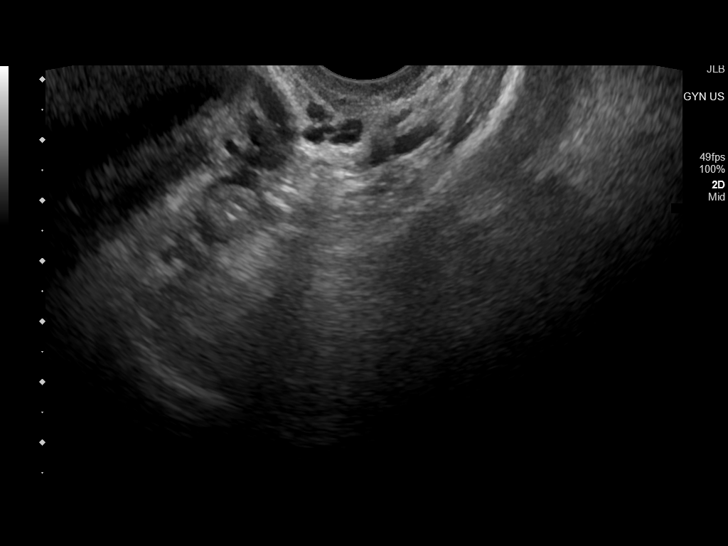

[14 of 25 positions shown; findings below may reference images not displayed]

FINDINGS: Uterus

Measurements: 11.9 x 6 x 6.1 cm = volume: 226.7 mL. No fibroids or
other mass visualized.

Endometrium

Thickness: 6.5 mm.  No focal abnormality visualized.

Right ovary

Measurements: 2.8 x 1.6 x 2.1 cm = volume: 4.9 mL. Normal
appearance/no adnexal mass.

Left ovary

Measurements: 4.4 x 2.2 x 2.4 cm = volume: 12.3 mL. Normal
appearance/no adnexal mass.

Other findings

No abnormal free fluid.
IMPRESSION: Negative pelvic ultrasound

## 2021-04-20 MED ORDER — IBUPROFEN 800 MG PO TABS: 800.0000 mg | ORAL_TABLET | Freq: Three times a day (TID) | ORAL | 0 refills | Status: DC | PRN

## 2021-04-20 MED ORDER — CYCLOBENZAPRINE HCL 10 MG PO TABS
10.0000 mg | ORAL_TABLET | Freq: Three times a day (TID) | ORAL | 0 refills | Status: DC | PRN
Start: 2021-04-20 — End: 2021-04-20

## 2021-04-20 MED ORDER — CYCLOBENZAPRINE HCL 10 MG PO TABS: 10.0000 mg | ORAL_TABLET | Freq: Three times a day (TID) | ORAL | 0 refills | Status: DC | PRN

## 2021-04-20 NOTE — Progress Notes (Signed)
BP (!) 134/91   Pulse 82   Ht 5' 3.5" (1.613 m)   Wt 207 lb (93.9 kg)   BMI 36.09 kg/m    Subjective:    Patient ID: Lynn Vargas, female    DOB: [birth year], 40 y.o.   MRN: [id number]  HPI: Lynn Vargas is a 40 y.o. female  Chief Complaint  Patient presents with   Back Pain    Denies injury, started last night   BACK PAIN Duration: days Mechanism of injury: no trauma Location:  shoulder blade area and Right Onset: sudden Severity: 7/10 Quality: aching and shooting Frequency: constant Radiation: none Aggravating factors: movement Alleviating factors: laying Status: stable Treatments attempted: none  Relief with NSAIDs?: No NSAIDs Taken Nighttime pain:  yes Paresthesias / decreased sensation:  no Bowel / bladder incontinence:  no Fevers:  no Dysuria / urinary frequency:  no  Relevant past medical, surgical, family and social history reviewed and updated as indicated. Interim medical history since our last visit reviewed. Allergies and medications reviewed and updated.  Review of Systems  Musculoskeletal:  Positive for back pain.   Per HPI unless specifically indicated above     Objective:    BP (!) 134/91   Pulse 82   Ht 5' 3.5" (1.613 m)   Wt 207 lb (93.9 kg)   BMI 36.09 kg/m   Wt Readings from Last 3 Encounters:  04/20/21 207 lb (93.9 kg)  01/03/21 207 lb 12.8 oz (94.3 kg)  10/14/20 206 lb 12.8 oz (93.8 kg)    Physical Exam Vitals and nursing note reviewed.  Constitutional:      General: She is not in acute distress.    Appearance: Normal appearance. She is normal weight. She is not ill-appearing, toxic-appearing or diaphoretic.  HENT:     Head: Normocephalic.     Right Ear: External ear normal.     Left Ear: External ear normal.     Nose: Nose normal.     Mouth/Throat:     Mouth: Mucous membranes are moist.     Pharynx: Oropharynx is clear.  Eyes:     General:        Right eye: No discharge.        Left eye: No discharge.      Extraocular Movements: Extraocular movements intact.     Conjunctiva/sclera: Conjunctivae normal.     Pupils: Pupils are equal, round, and reactive to light.  Cardiovascular:     Rate and Rhythm: Normal rate and regular rhythm.     Heart sounds: No murmur heard. Pulmonary:     Effort: Pulmonary effort is normal. No respiratory distress.     Breath sounds: Normal breath sounds. No wheezing or rales.  Musculoskeletal:       Arms:     Cervical back: Normal range of motion and neck supple.  Skin:    General: Skin is warm and dry.     Capillary Refill: Capillary refill takes less than 2 seconds.  Neurological:     General: No focal deficit present.     Mental Status: She is alert and oriented to person, place, and time. Mental status is at baseline.  Psychiatric:        Mood and Affect: Mood normal.        Behavior: Behavior normal.        Thought Content: Thought content normal.        Judgment: Judgment normal.    Results for orders placed or  performed in visit on 01/03/21  WET PREP FOR [place], YEAST, CLUE   Specimen: Sterile Swab   Sterile Swab  Result Value Ref Range   Trichomonas Exam Negative Negative   Yeast Exam Negative Negative   Clue Cell Exam Negative Negative  Microscopic Examination   Urine  Result Value Ref Range   WBC, UA None seen 0 - 5 /hpf   RBC 0-2 0 - 2 /hpf   Epithelial Cells (non renal) None seen 0 - 10 /hpf   Mucus, UA Present (A) Not Estab.   Bacteria, UA None seen None seen/Few  Urinalysis, Routine w reflex microscopic  Result Value Ref Range   Specific Gravity, UA 1.015 1.005 - 1.030   pH, UA 7.0 5.0 - 7.5   Color, UA Yellow Yellow   Appearance Ur Clear Clear   Leukocytes,UA Negative Negative   Protein,UA Negative Negative/Trace   Glucose, UA Negative Negative   Ketones, UA Negative Negative   RBC, UA Trace (A) Negative   Bilirubin, UA Negative Negative   Urobilinogen, Ur 0.2 0.2 - 1.0 mg/dL   Nitrite, UA Negative Negative   Microscopic  Examination See below:       Assessment & Plan:   Problem List Items Addressed This Visit   None Visit Diagnoses     Muscle spasm    -  Primary   Recommend using flexeril and ibuprofen PRN for muscle spasm. Recommend a massage to help with muscle spasm. FU if symptoms do not improve.   Relevant Medications   cyclobenzaprine (FLEXERIL) 10 MG tablet   ibuprofen (ADVIL) 800 MG tablet        Follow up plan: Return if symptoms worsen or fail to improve.

## 2021-05-09 ENCOUNTER — Telehealth (INDEPENDENT_AMBULATORY_CARE_PROVIDER_SITE_OTHER): Payer: BC Managed Care – PPO | Admitting: Nurse Practitioner

## 2021-05-09 ENCOUNTER — Encounter: Payer: Self-pay | Admitting: Nurse Practitioner

## 2021-05-09 DIAGNOSIS — F419 Anxiety disorder, unspecified: Secondary | ICD-10-CM

## 2021-05-09 MED ORDER — BUSPIRONE HCL 5 MG PO TABS
5.0000 mg | ORAL_TABLET | Freq: Two times a day (BID) | ORAL | 4 refills | Status: DC
Start: 2021-05-09 — End: 2021-09-27

## 2021-05-09 NOTE — Patient Instructions (Signed)

## 2021-05-09 NOTE — Progress Notes (Signed)
There were no vitals taken for this visit.   Subjective:    Patient ID: Lynn Vargas, female    DOB: [birth year], 40 y.o.   MRN: [id number]  HPI: Lynn Vargas is a 40 y.o. female  Chief Complaint  Patient presents with   Anxiety   This visit was completed via video visit through MyChart due to the restrictions of the COVID-19 pandemic. All issues as above were discussed and addressed. Physical exam was done as above through visual confirmation on video through MyChart. If it was felt that the patient should be evaluated in the office, they were directed there. The patient verbally consented to this visit. Location of the patient: home Location of the provider: work Those involved with this call:  Provider: Marnee Guarneri, DNP CMA: Irena Reichmann, [place] Desk/Registration: Leota Jacobsen  Time spent on call:  21 minutes with patient face to face via video conference. More than 50% of this time was spent in counseling and coordination of care. 15 minutes total spent in review of patient's record and preparation of their chart.   ANXIETY/STRESS Currently taking Wellbutrin 300 MG every other day -- she has not taken every day per her report -- has tried Buspar, which she would like to return to.  Lots of stressors between work and raising.   Duration:exacerbated Anxious mood: yes  Excessive worrying: yes Irritability: yes  Sweating: no Nausea: no Palpitations:no Hyperventilation: no Panic attacks: no Agoraphobia: no  Obscessions/compulsions: no Depressed mood: no Depression screen [facility] 2/9 05/09/2021 01/03/2021 10/14/2020 07/31/2018 03/15/2018  Decreased Interest 1 0 3 3 1   Down, Depressed, Hopeless 1 1 3 3 2   PHQ - 2 Score 2 1 6 6 3   Altered sleeping 1 2 3 1  0  Tired, decreased energy 1 1 3 2 3   Change in appetite 1 1 3 3 3   Feeling bad or failure about yourself  0 0 3 0 0  Trouble concentrating 0 0 0 1 0  Moving slowly or fidgety/restless 0 0 0 0 0  Suicidal thoughts 0  0 0 0 0  PHQ-9 Score 5 5 18 13 9   Difficult doing work/chores Somewhat difficult Somewhat difficult Very difficult - -   Anhedonia: no Weight changes: no Insomnia: yes hard to fall asleep  Hypersomnia: no Fatigue/loss of energy: no Feelings of worthlessness: no Feelings of guilt: no Impaired concentration/indecisiveness: yes Suicidal ideations: no  Crying spells: no Recent Stressors/Life Changes: yes   Relationship problems: no   Family stress: no     Financial stress: no    Job stress: yes    Recent death/loss: no  GAD 7 : Generalized Anxiety Score 05/09/2021 10/14/2020 07/31/2018 03/15/2018  Nervous, Anxious, on Edge 2 3 1 3   Control/stop worrying 2 3 3  0  Worry too much - different things 2 3 2 2   Trouble relaxing 0 3 3 1   Restless 0 0 1 0  Easily annoyed or irritable 2 3 3 3   Afraid - awful might happen 1 2 0 0  Total GAD 7 Score 9 17 13 9   Anxiety Difficulty Somewhat difficult Extremely difficult - Not difficult at all     Relevant past medical, surgical, family and social history reviewed and updated as indicated. Interim medical history since our last visit reviewed. Allergies and medications reviewed and updated.  Review of Systems  Constitutional: Negative.   Respiratory: Negative.    Cardiovascular: Negative.   Gastrointestinal: Negative.   Psychiatric/Behavioral:  Positive for  decreased concentration and sleep disturbance. Negative for self-injury and suicidal ideas. The patient is nervous/anxious.    Per HPI unless specifically indicated above     Objective:    There were no vitals taken for this visit.  Wt Readings from Last 3 Encounters:  04/20/21 207 lb (93.9 kg)  01/03/21 207 lb 12.8 oz (94.3 kg)  10/14/20 206 lb 12.8 oz (93.8 kg)    Physical Exam Vitals and nursing note reviewed.  Constitutional:      General: She is awake. She is not in acute distress.    Appearance: She is well-developed. She is not ill-appearing.  HENT:     Head:  Normocephalic.     Right Ear: Hearing normal.     Left Ear: Hearing normal.  Eyes:     General: Lids are normal.        Right eye: No discharge.        Left eye: No discharge.     Conjunctiva/sclera: Conjunctivae normal.  Pulmonary:     Effort: Pulmonary effort is normal. No accessory muscle usage or respiratory distress.  Musculoskeletal:     Cervical back: Normal range of motion.  Neurological:     Mental Status: She is alert and oriented to person, place, and time.  Psychiatric:        Attention and Perception: Attention normal.        Mood and Affect: Mood normal.        Behavior: Behavior normal. Behavior is cooperative.        Thought Content: Thought content normal.        Judgment: Judgment normal.    Results for orders placed or performed in visit on 01/03/21  WET PREP FOR [place], YEAST, CLUE   Specimen: Sterile Swab   Sterile Swab  Result Value Ref Range   Trichomonas Exam Negative Negative   Yeast Exam Negative Negative   Clue Cell Exam Negative Negative  Microscopic Examination   Urine  Result Value Ref Range   WBC, UA None seen 0 - 5 /hpf   RBC 0-2 0 - 2 /hpf   Epithelial Cells (non renal) None seen 0 - 10 /hpf   Mucus, UA Present (A) Not Estab.   Bacteria, UA None seen None seen/Few  Urinalysis, Routine w reflex microscopic  Result Value Ref Range   Specific Gravity, UA 1.015 1.005 - 1.030   pH, UA 7.0 5.0 - 7.5   Color, UA Yellow Yellow   Appearance Ur Clear Clear   Leukocytes,UA Negative Negative   Protein,UA Negative Negative/Trace   Glucose, UA Negative Negative   Ketones, UA Negative Negative   RBC, UA Trace (A) Negative   Bilirubin, UA Negative Negative   Urobilinogen, Ur 0.2 0.2 - 1.0 mg/dL   Nitrite, UA Negative Negative   Microscopic Examination See below:       Assessment & Plan:   Problem List Items Addressed This Visit       Other   Anxiety    Chronic, ongoing.  Denies SI/HI.   Exacerbated by recent stressors, would like to  restart Buspar.  Recommend she take Wellbutrin 300 MG daily instead of every other day and restart Buspar 5 MG BID, script sent.  Recommend trial of SSRI in future, she does not wish to pursue at this time.  Return in 8 weeks for follow-up with PCP.      Relevant Medications   busPIRone (BUSPAR) 5 MG tablet    I discussed the assessment  and treatment plan with the patient. The patient was provided an opportunity to ask questions and all were answered. The patient agreed with the plan and demonstrated an understanding of the instructions.   The patient was advised to call back or seek an in-person evaluation if the symptoms worsen or if the condition fails to improve as anticipated.   I provided 21+ minutes of time during this encounter.   Follow up plan: Return in about 8 weeks (around 07/04/2021) for Anxiety.

## 2021-05-09 NOTE — Assessment & Plan Note (Signed)
Chronic, ongoing.  Denies SI/HI.   Exacerbated by recent stressors, would like to restart Buspar.  Recommend she take Wellbutrin 300 MG daily instead of every other day and restart Buspar 5 MG BID, script sent.  Recommend trial of SSRI in future, she does not wish to pursue at this time.  Return in 8 weeks for follow-up with PCP.

## 2021-05-10 NOTE — Progress Notes (Signed)
Called patient to schedule appt. Was unable to  leave VM.

## 2021-05-20 ENCOUNTER — Ambulatory Visit: Payer: Self-pay | Admitting: *Deleted

## 2021-05-20 NOTE — Telephone Encounter (Signed)
Patient is calling to report she is having cramping pain that last 3-4 minutes lower pelvis region- what she describes as cervical. Patient states she has had this occur twice. Patient is not sure of cause- but states it does make her stop until it passes. 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 Patient has been scheduled for next week- but if he can come in earlier than her appointment time- she would appreciate it.

## 2021-05-20 NOTE — Telephone Encounter (Signed)
Reason for Disposition  Abdominal pain is a chronic symptom (recurrent or ongoing AND present > 4 weeks)  Answer Assessment - Initial Assessment Questions 1. LOCATION: "Where does it hurt?"      Lower abdomen- in pelvis 2. RADIATION: "Does the pain shoot anywhere else?" (e.g., chest, back)     no 3. ONSET: "When did the pain begin?" (e.g., minutes, hours or days ago)      Yesterday- 3-4 months ago 4. SUDDEN: "Gradual or sudden onset?"     Sudden- lasted 3-4 minutes 5. PATTERN "Does the pain come and go, or is it constant?"    - If constant: "Is it getting better, staying the same, or worsening?"      (Note: Constant means the pain never goes away completely; most serious pain is constant and it progresses)     - If intermittent: "How long does it last?" "Do you have pain now?"     (Note: Intermittent means the pain goes away completely between bouts)     Comes and goes- lasted 3-4 minutes, no pain 6. SEVERITY: "How bad is the pain?"  (e.g., Scale 1-10; mild, moderate, or severe)   - MILD (1-3): doesn't interfere with normal activities, abdomen soft and not tender to touch    - MODERATE (4-7): interferes with normal activities or awakens from sleep, abdomen tender to touch    - SEVERE (8-10): excruciating pain, doubled over, unable to do any normal activities      Discomfort- dull strong- moderate 7. RECURRENT SYMPTOM: "Have you ever had this type of stomach pain before?" If Yes, ask: "When was the last time?" and "What happened that time?"      no 8. CAUSE: "What do you think is causing the stomach pain?"     unsure 9. RELIEVING/AGGRAVATING FACTORS: "What makes it better or worse?" (e.g., movement, antacids, bowel movement)     Standing still- waiting 10. OTHER SYMPTOMS: "Do you have any other symptoms?" (e.g., back pain, diarrhea, fever, urination pain, vomiting)       no 11. PREGNANCY: "Is there any chance you are pregnant?" "When was your last menstrual period?"       No- LMP-  05/09/21- heavy cycles  Protocols used: Abdominal Pain - Female-A-AH

## 2021-05-23 ENCOUNTER — Telehealth: Payer: BC Managed Care – PPO | Admitting: Nurse Practitioner

## 2021-05-25 NOTE — Progress Notes (Signed)
Acute Office Visit  Subjective:    Patient ID: Lynn Vargas, female    DOB: [birth year], 40 y.o.   MRN: [id number]  Chief Complaint  Patient presents with   Vaginal Pain    Cramping in very uncomfortable. Has had 2 episodes in the last few months.Cramping episodes last about 2-3 minutes.Discharge is clear and white and everyday. Not sexually active.   Rash    HPI Patient is in today for lower pelvic pain and vaginal discharge.  PELVIC PAIN  Gravida/Para: [id number] Duration: months Onset: sudden Severity: severe Quality:  spasm Frequency: intermittent Episode duration: several minutes (less than 5 minutes) Alleviating factors: time Aggravating factors: unsure Previous evaluation/studies: no Treatments attempted: none Fevers: no Nausea/vomiting: no Vaginal discharge: yes - white/clear Dysmenorrhea: no Dyspareunia: no Weight loss: no Dysuria/urinary frequency:  once Hematuria: no Sexual activity: no Contraception: no History STDs: yes - HSV History GYN procedures:  c-section Previous pap smear: yes   Past Medical History:  Diagnosis Date   Anemia    Anxiety    Cyst of perianal area    removed   Depression    Fetal abnormality in antepartum pregnancy 2012   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   Hypertension    after pregnancy   Shortness of breath    UTI (lower urinary tract infection)     Past Surgical History:  Procedure Laterality Date   CESAREAN SECTION     Pionidal cyst      Family History  Problem Relation Age of Onset   Asthma Mother    Cancer Daughter        placenta cancer   Diabetes Maternal Aunt    Heart disease Maternal Grandmother    Stroke Neg Hx     Social History   Socioeconomic History   Marital status: Divorced    Spouse name: Not on file   Number of children: Not on file   Years of  education: Not on file   Highest education level: Not on file  Occupational History   Not on file  Tobacco Use   Smoking status: Never   Smokeless tobacco: Never  Vaping Use   Vaping Use: Never used  Substance and Sexual Activity   Alcohol use: No   Drug use: No   Sexual activity: Not Currently  Other Topics Concern   Not on file  Social History Narrative   Not on file   Social Determinants of Health   Financial Resource Strain: Not on file  Food Insecurity: Not on file  Transportation Needs: Not on file  Physical Activity: Not on file  Stress: Not on file  Social Connections: Not on file  Intimate Partner Violence: Not on file    Outpatient Medications Prior to Visit  Medication Sig Dispense Refill   buPROPion (WELLBUTRIN XL) 300 MG 24 hr tablet Take 1 tablet (300 mg total) by mouth daily. 90 tablet 1   busPIRone (BUSPAR) 5 MG tablet Take 1 tablet (5 mg total) by mouth 2 (two) times daily. (Patient taking differently: Take 5 mg by mouth at bedtime.) 180 tablet 4   cyclobenzaprine (FLEXERIL) 10 MG tablet Take 1 tablet (10 mg total) by mouth 3 (three) times daily as needed for muscle spasms. 30 tablet 0   ibuprofen (ADVIL) 800 MG tablet Take 1 tablet (800 mg total)  by mouth every 8 (eight) hours as needed. 30 tablet 0   valACYclovir (VALTREX) 1000 MG tablet TAKE 1 TABLET (1,000 MG TOTAL) BY MOUTH DAILY. 90 tablet 0   cetirizine (ZYRTEC) 10 MG tablet Take 10 mg by mouth daily. (Patient not taking: Reported on 05/26/2021)     No facility-administered medications prior to visit.    Allergies  Allergen Reactions   Latex     rash    Review of Systems  Constitutional: Negative.   Respiratory: Negative.    Cardiovascular: Negative.   Gastrointestinal: Negative.   Genitourinary:  Positive for pelvic pain. Negative for dysuria, frequency and hematuria.  Musculoskeletal: Negative.   Skin: Negative.   Neurological: Negative.       Objective:    Physical Exam Vitals  and nursing note reviewed. Exam conducted with a chaperone present.  Constitutional:      General: She is not in acute distress.    Appearance: Normal appearance.  HENT:     Head: Normocephalic.  Eyes:     Conjunctiva/sclera: Conjunctivae normal.  Cardiovascular:     Rate and Rhythm: Normal rate and regular rhythm.     Pulses: Normal pulses.     Heart sounds: Normal heart sounds.  Pulmonary:     Effort: Pulmonary effort is normal.     Breath sounds: Normal breath sounds.  Abdominal:     Palpations: Abdomen is soft.     Tenderness: There is no abdominal tenderness.  Genitourinary:    Exam position: Lithotomy position.     Labia:        Right: No rash or tenderness.        Left: No rash or tenderness.      Vagina: Normal.     Cervix: Normal.     Uterus: Normal.      Adnexa: Right adnexa normal and left adnexa normal.  Musculoskeletal:     Cervical back: Normal range of motion.  Skin:    General: Skin is warm.  Neurological:     General: No focal deficit present.     Mental Status: She is alert and oriented to person, place, and time.  Psychiatric:        Mood and Affect: Mood normal.        Behavior: Behavior normal.        Thought Content: Thought content normal.        Judgment: Judgment normal.    BP 120/84   Pulse 68   Temp 98.4 F (36.9 C) (Oral)   Wt 213 lb (96.6 kg)   LMP 05/09/2021 (Exact Date)   SpO2 98%   BMI 37.14 kg/m  Wt Readings from Last 3 Encounters:  05/26/21 213 lb (96.6 kg)  04/20/21 207 lb (93.9 kg)  01/03/21 207 lb 12.8 oz (94.3 kg)    Health Maintenance Due  Topic Date Due   PAP SMEAR-Modifier  08/29/2019   COVID-19 Vaccine (2 - Moderna series) 08/18/2020    There are no preventive care reminders to display for this patient.   Lab Results  Component Value Date   TSH 0.896 10/31/2019   Lab Results  Component Value Date   WBC 4.8 08/18/2020   HGB 13.2 08/18/2020   HCT 39.3 08/18/2020   MCV 89 08/18/2020   PLT 227 08/18/2020    Lab Results  Component Value Date   NA 138 08/18/2020   K 4.3 08/18/2020   CO2 21 08/18/2020   GLUCOSE 98 08/18/2020   BUN 15  08/18/2020   CREATININE 0.81 08/18/2020   BILITOT 0.5 08/18/2020   ALKPHOS 43 (L) 08/18/2020   AST 15 08/18/2020   ALT 15 08/18/2020   PROT 6.9 08/18/2020   ALBUMIN 4.4 08/18/2020   CALCIUM 9.6 08/18/2020   ANIONGAP 7 08/04/2012   Lab Results  Component Value Date   CHOL 195 10/31/2019   Lab Results  Component Value Date   HDL 67 10/31/2019   Lab Results  Component Value Date   LDLCALC 117 (H) 10/31/2019   Lab Results  Component Value Date   TRIG 58 10/31/2019   No results found for: [facility] Lab Results  Component Value Date   HGBA1C 5.1 03/29/2017       Assessment & Plan:   Problem List Items Addressed This Visit       Other   Pelvic pain    UA and wet prep negative today.  She has not had intercourse in over a year and declines pregnancy testing.  With pain intermittent spasms most likely pelvic floor spasm.  Discussed Kegel exercises.  She does have a history of cesarean.  Only has had 2 episodes in the last 4 months.  If starts occurring more frequently, call the office.      Other Visit Diagnoses     Vaginal discharge    -  Primary   wet prep and u/a negative   Relevant Orders   Urinalysis, Routine w reflex microscopic (Completed)   WET PREP FOR [place], YEAST, CLUE   Screening for cervical cancer       Last pap no dysplasia, but positive HPV. Will repeat pap today per guideline recommendations    Relevant Orders   Cytology - PAP        No orders of the defined types were placed in this encounter.    Charyl Dancer, NP

## 2021-05-26 ENCOUNTER — Ambulatory Visit (INDEPENDENT_AMBULATORY_CARE_PROVIDER_SITE_OTHER): Payer: BC Managed Care – PPO | Admitting: Nurse Practitioner

## 2021-05-26 ENCOUNTER — Other Ambulatory Visit: Payer: Self-pay

## 2021-05-26 ENCOUNTER — Other Ambulatory Visit (HOSPITAL_COMMUNITY)
Admission: RE | Admit: 2021-05-26 | Discharge: 2021-05-26 | Disposition: A | Discharge status: Home / Self Care | Payer: BC Managed Care – PPO | Source: Ambulatory Visit | Attending: Nurse Practitioner | Admitting: Nurse Practitioner

## 2021-05-26 ENCOUNTER — Encounter: Payer: Self-pay | Admitting: Nurse Practitioner

## 2021-05-26 VITALS — BP 120/84 | HR 68 | Temp 98.4°F | Wt 213.0 lb

## 2021-05-26 DIAGNOSIS — N898 Other specified noninflammatory disorders of vagina: Secondary | ICD-10-CM

## 2021-05-26 DIAGNOSIS — Z124 Encounter for screening for malignant neoplasm of cervix: Secondary | ICD-10-CM

## 2021-05-26 DIAGNOSIS — R102 Pelvic and perineal pain: Secondary | ICD-10-CM | POA: Insufficient documentation

## 2021-05-26 LAB — URINALYSIS, ROUTINE W REFLEX MICROSCOPIC
Bilirubin, UA: NEGATIVE
Glucose, UA: NEGATIVE
Ketones, UA: NEGATIVE
Leukocytes,UA: NEGATIVE
Nitrite, UA: NEGATIVE
Protein,UA: NEGATIVE
RBC, UA: NEGATIVE
Specific Gravity, UA: 1.015 (ref 1.005–1.030)
Urobilinogen, Ur: 0.2 mg/dL (ref 0.2–1.0)
pH, UA: 7 (ref 5.0–7.5)

## 2021-05-26 LAB — WET PREP FOR TRICH, YEAST, CLUE
Clue Cell Exam: NEGATIVE
Trichomonas Exam: NEGATIVE
Yeast Exam: NEGATIVE

## 2021-05-26 NOTE — Assessment & Plan Note (Signed)
UA and wet prep negative today.  She has not had intercourse in over a year and declines pregnancy testing.  With pain intermittent spasms most likely pelvic floor spasm.  Discussed Kegel exercises.  She does have a history of cesarean.  Only has had 2 episodes in the last 4 months.  If starts occurring more frequently, call the office.

## 2021-06-06 LAB — CYTOLOGY - PAP
Adequacy: ABSENT
Chlamydia: NEGATIVE
Comment: NEGATIVE
Comment: NEGATIVE
Comment: NEGATIVE
Comment: NORMAL
Diagnosis: NEGATIVE
High risk HPV: NEGATIVE
Neisseria Gonorrhea: NEGATIVE
Trichomonas: NEGATIVE

## 2021-07-20 ENCOUNTER — Ambulatory Visit: Payer: BC Managed Care – PPO | Admitting: Nurse Practitioner

## 2021-07-27 ENCOUNTER — Ambulatory Visit: Payer: BC Managed Care – PPO | Admitting: Nurse Practitioner

## 2021-07-29 ENCOUNTER — Ambulatory Visit: Payer: BC Managed Care – PPO | Admitting: Nurse Practitioner

## 2021-07-29 ENCOUNTER — Encounter: Payer: Self-pay | Admitting: Nurse Practitioner

## 2021-09-14 ENCOUNTER — Other Ambulatory Visit: Payer: Self-pay | Admitting: Nurse Practitioner

## 2021-09-14 NOTE — Telephone Encounter (Signed)
Patient is overdue for routine follow up. Please call to schedule.  ?

## 2021-09-14 NOTE — Telephone Encounter (Signed)
Requested medication (s) are due for refill today:   Yes ? ?Requested medication (s) are on the active medication list:   Yes ? ?Future visit scheduled:   No ? ? ?Last ordered: 01/03/2021 #90, 1 refill ? ?Returned because protocol criteria not met.   Lab work due.  ? ?Requested Prescriptions  ?Pending Prescriptions Disp Refills  ? buPROPion (WELLBUTRIN XL) 300 MG 24 hr tablet [Pharmacy Med Name: BUPROPION HCL XL 300 MG TABLET] 90 tablet 1  ?  Sig: TAKE 1 TABLET BY MOUTH DAILY.  ?  ? Psychiatry: Antidepressants - bupropion Failed - 09/14/2021  1:36 AM  ?  ?  Failed - Cr in normal range and within 360 days  ?  Creatinine  ?Date Value Ref Range Status  ?08/04/2012 0.99 0.60 - 1.30 mg/dL Final  ? ?Creatinine, Ser  ?Date Value Ref Range Status  ?08/18/2020 0.81 0.57 - 1.00 mg/dL Final  ?  ?  ?  ?  Failed - AST in normal range and within 360 days  ?  AST  ?Date Value Ref Range Status  ?08/18/2020 15 0 - 40 IU/L Final  ? ?SGOT(AST)  ?Date Value Ref Range Status  ?08/04/2012 18 15 - 37 Unit/L Final  ?  ?  ?  ?  Failed - ALT in normal range and within 360 days  ?  ALT  ?Date Value Ref Range Status  ?08/18/2020 15 0 - 32 IU/L Final  ? ?SGPT (ALT)  ?Date Value Ref Range Status  ?08/04/2012 24 12 - 78 U/L Final  ?  ?  ?  ?  Passed - Completed PHQ-2 or PHQ-9 in the last 360 days  ?  ?  Passed - Last BP in normal range  ?  BP Readings from Last 1 Encounters:  ?05/26/21 120/84  ?  ?  ?  ?  Passed - Valid encounter within last 6 months  ?  Recent Outpatient Visits   ? ?      ? 3 months ago Vaginal discharge  ? [place], Lauren A, NP  ? 4 months ago Anxiety  ? [place], [place]ia T, NP  ? 4 months ago Muscle spasm  ? [place], NP  ? 8 months ago Urinary frequency  ? [place], NP  ? 10 months ago Sinus pressure  ? [facility] [place], Henrine Screws T, NP  ? ?  ?  ? ?  ?  ?  ? ?

## 2021-09-27 ENCOUNTER — Encounter: Payer: Self-pay | Admitting: Unknown Physician Specialty

## 2021-09-27 ENCOUNTER — Other Ambulatory Visit: Payer: Self-pay | Admitting: Unknown Physician Specialty

## 2021-09-27 ENCOUNTER — Ambulatory Visit: Payer: BC Managed Care – PPO | Admitting: Unknown Physician Specialty

## 2021-09-27 ENCOUNTER — Other Ambulatory Visit: Payer: Self-pay

## 2021-09-27 VITALS — BP 132/83 | HR 83 | Temp 98.4°F | Wt 222.6 lb

## 2021-09-27 DIAGNOSIS — E669 Obesity, unspecified: Secondary | ICD-10-CM | POA: Diagnosis not present

## 2021-09-27 DIAGNOSIS — Z111 Encounter for screening for respiratory tuberculosis: Secondary | ICD-10-CM

## 2021-09-27 DIAGNOSIS — F330 Major depressive disorder, recurrent, mild: Secondary | ICD-10-CM

## 2021-09-27 DIAGNOSIS — R5383 Other fatigue: Secondary | ICD-10-CM | POA: Diagnosis not present

## 2021-09-27 MED ORDER — BUPROPION HCL ER (XL) 300 MG PO TB24: 300.0000 mg | ORAL_TABLET | Freq: Every day | ORAL | 1 refills | Status: DC

## 2021-09-27 NOTE — Progress Notes (Signed)
? ?BP 132/83   Pulse 83   Temp 98.4 ?F (36.9 ?C) (Oral)   Wt 222 lb 9.6 oz (101 kg)   SpO2 98%   BMI 38.81 kg/m?   ? ?Subjective:  ? ? Patient ID: Lynn Vargas, female    DOB: 16-Jun-1981, 41 y.o.   MRN: [id number] ? ?HPI: ?Lynn Vargas is a 41 y.o. female ? ?Chief Complaint  ?Patient presents with  ? Weight Gain  ?  Pt states she has been gaining a lot of weight lately. States she has changed her diet and feels that she should not have gained this much weight.   ? ?Pt states that she has persistent fatigue and weight gain for the last year.  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  Her big change has been drinking Kombucha in the AM.  Oatmeal in the morning, Keto meals mostly with the occasional [employer] meals.  She feels her change started last year when she got the Covid vaccine.  Never feels she gets enough rest and lacks motivation.  She has been told she snores at night.   ? ?States her menstrual periods have been irregular after a stressful route.  States they have been regular lately, 5 days and heavy.  ? ?Admits to stress with teenage children at home.  Was taking Wellbutrin which helped her lose weight and admits it helped with motivation.   ? ?Depression screen [facility] 2/9 09/27/2021 05/09/2021 01/03/2021 10/14/2020 07/31/2018  ?Decreased Interest 3 1 0 3 3  ?Down, Depressed, Hopeless '3 1 1 3 3  '$ ?PHQ - 2 Score '6 2 1 6 6  '$ ?Altered sleeping 0 '1 2 3 1  '$ ?Tired, decreased energy '3 1 1 3 2  '$ ?Change in appetite '2 1 1 3 3  '$ ?Feeling bad or failure about yourself  0 0 0 3 0  ?Trouble concentrating 0 0 0 0 1  ?Moving slowly or fidgety/restless 2 0 0 0 0  ?Suicidal thoughts 0 0 0 0 0  ?PHQ-9 Score '13 5 5 18 13  '$ ?Difficult doing work/chores Somewhat difficult Somewhat difficult Somewhat difficult Very difficult -  ? ?GAD 7 : Generalized Anxiety Score 09/27/2021 05/09/2021 10/14/2020 07/31/2018  ?Nervous, Anxious, on Edge '3 2 3 1  '$ ?Control/stop worrying '3 2 3 3  '$ ?Worry too much - different things '3 2 3 2  '$ ?Trouble  relaxing 3 0 3 3  ?Restless 0 0 0 1  ?Easily annoyed or irritable '3 2 3 3  '$ ?Afraid - awful might happen '2 1 2 '$ 0  ?Total GAD 7 Score '17 9 17 13  '$ ?Anxiety Difficulty Somewhat difficult Somewhat difficult Extremely difficult -  ? ? ? ? ?Relevant past medical, surgical, family and social history reviewed and updated as indicated. Interim medical history since our last visit reviewed. ?Allergies and medications reviewed and updated. ? ?Review of Systems  ?Constitutional: Negative.   ?Respiratory: Negative.    ?Cardiovascular: Negative.   ?Genitourinary: Negative.   ?Skin: Negative.   ? ?Per HPI unless specifically indicated above ? ?   ?Objective:  ?  ?BP 132/83   Pulse 83   Temp 98.4 ?F (36.9 ?C) (Oral)   Wt 222 lb 9.6 oz (101 kg)   SpO2 98%   BMI 38.81 kg/m?   ?Wt Readings from Last 3 Encounters:  ?09/27/21 222 lb 9.6 oz (101 kg)  ?05/26/21 213 lb (96.6 kg)  ?04/20/21 207 lb (93.9 kg)  ?  ?Physical Exam ?Constitutional:   ?  General: She is not in acute distress. ?   Appearance: Normal appearance. She is well-developed.  ?HENT:  ?   Head: Normocephalic and atraumatic.  ?Eyes:  ?   General: Lids are normal. No scleral icterus.    ?   Right eye: No discharge.     ?   Left eye: No discharge.  ?   Conjunctiva/sclera: Conjunctivae normal.  ?Neck:  ?   Vascular: No carotid bruit or JVD.  ?Cardiovascular:  ?   Rate and Rhythm: Normal rate and regular rhythm.  ?   Heart sounds: Normal heart sounds.  ?Pulmonary:  ?   Effort: Pulmonary effort is normal. No respiratory distress.  ?   Breath sounds: Normal breath sounds.  ?Abdominal:  ?   Palpations: There is no hepatomegaly or splenomegaly.  ?Musculoskeletal:     ?   General: Normal range of motion.  ?   Cervical back: Normal range of motion and neck supple.  ?Skin: ?   General: Skin is warm and dry.  ?   Coloration: Skin is not pale.  ?   Findings: No rash.  ?Neurological:  ?   Mental Status: She is alert and oriented to person, place, and time.  ?Psychiatric:     ?    Behavior: Behavior normal.     ?   Thought Content: Thought content normal.     ?   Judgment: Judgment normal.  ? ? ?Results for orders placed or performed in visit on 05/26/21  ?WET PREP FOR [place], YEAST, CLUE  ? Urine  ?Result Value Ref Range  ? Trichomonas Exam Negative Negative  ? Yeast Exam Negative Negative  ? Clue Cell Exam Negative Negative  ?Urinalysis, Routine w reflex microscopic  ?Result Value Ref Range  ? Specific Gravity, UA 1.015 1.005 - 1.030  ? pH, UA 7.0 5.0 - 7.5  ? Color, UA Yellow Yellow  ? Appearance Ur Clear Clear  ? Leukocytes,UA Negative Negative  ? Protein,UA Negative Negative/Trace  ? Glucose, UA Negative Negative  ? Ketones, UA Negative Negative  ? RBC, UA Negative Negative  ? Bilirubin, UA Negative Negative  ? Urobilinogen, Ur 0.2 0.2 - 1.0 mg/dL  ? Nitrite, UA Negative Negative  ?Cytology - PAP  ?Result Value Ref Range  ? High risk HPV Negative   ? Neisseria Gonorrhea Negative   ? Chlamydia Negative   ? Trichomonas Negative   ? Adequacy    ?  Satisfactory for evaluation; transformation zone component ABSENT.  ? Diagnosis    ?  - Negative for intraepithelial lesion or malignancy (NILM)  ? Comment Normal Reference [place] Chlamydia - Negative   ? Comment    ?  Normal Reference Range Neisseria Gonorrhea - Negative  ? Comment Normal Reference Range Trichomonas - Negative   ? Comment Normal Reference Range HPV - Negative   ? ?   ?Assessment & Plan:  ? ?Problem List Items Addressed This Visit   ? ?  ? Unprioritized  ? Depression  ?  Restart Wellbutrin as this helped her.  Discussed long-term use as needed.   ?  ?  ? Relevant Medications  ? buPROPion (WELLBUTRIN XL) 300 MG 24 hr tablet  ? Obesity (BMI 35.0-39.9 without comorbidity) - Primary  ? ?Other Visit Diagnoses   ? ? Fatigue, unspecified type      ? Hx on anemia.  Check CBC, CMP, B12, Vit D, Lyme panel, and TSH.  check sleep study as snores, does not wake rested.    ?  Relevant Orders  ? CBC with Differential/Platelet  ? Comprehensive  metabolic panel  ? TSH  ? VITAMIN D 25 Hydroxy (Vit-D Deficiency, Fractures)  ? Vitamin B12  ? Lyme Disease Serology w/Reflex  ? Home sleep test  ? ?  ?  ? ?Follow up plan: ?Has a f/u for a physical and may need an additional f/u ? ? ? ? ? ?

## 2021-09-27 NOTE — Addendum Note (Signed)
Addended by: Georgina Peer on: 09/27/2021 04:15 PM ? ? Modules accepted: Orders ? ?

## 2021-09-27 NOTE — Assessment & Plan Note (Signed)
Restart Wellbutrin as this helped her.  Discussed long-term use as needed.   ?

## 2021-09-28 LAB — CBC WITH DIFFERENTIAL/PLATELET
Basophils Absolute: 0.1 10*3/uL (ref 0.0–0.2)
Basos: 1 %
EOS (ABSOLUTE): 0.2 10*3/uL (ref 0.0–0.4)
Eos: 3 %
Hematocrit: 39.2 % (ref 34.0–46.6)
Hemoglobin: 12.8 g/dL (ref 11.1–15.9)
Immature Grans (Abs): 0 10*3/uL (ref 0.0–0.1)
Immature Granulocytes: 0 %
Lymphocytes Absolute: 2 10*3/uL (ref 0.7–3.1)
Lymphs: 32 %
MCH: 29 pg (ref 26.6–33.0)
MCHC: 32.7 g/dL (ref 31.5–35.7)
MCV: 89 fL (ref 79–97)
Monocytes Absolute: 0.5 10*3/uL (ref 0.1–0.9)
Monocytes: 7 %
Neutrophils Absolute: 3.5 10*3/uL (ref 1.4–7.0)
Neutrophils: 57 %
Platelets: 262 10*3/uL (ref 150–450)
RBC: 4.42 x10E6/uL (ref 3.77–5.28)
RDW: 13.2 % (ref 11.7–15.4)
WBC: 6.3 10*3/uL (ref 3.4–10.8)

## 2021-09-28 LAB — COMPREHENSIVE METABOLIC PANEL
ALT: 22 IU/L (ref 0–32)
AST: 18 IU/L (ref 0–40)
Albumin/Globulin Ratio: 2.2 (ref 1.2–2.2)
Albumin: 4.4 g/dL (ref 3.8–4.8)
Alkaline Phosphatase: 48 IU/L (ref 44–121)
BUN/Creatinine Ratio: 12 (ref 9–23)
BUN: 12 mg/dL (ref 6–24)
Bilirubin Total: 0.2 mg/dL (ref 0.0–1.2)
CO2: 23 mmol/L (ref 20–29)
Calcium: 9.6 mg/dL (ref 8.7–10.2)
Chloride: 108 mmol/L — ABNORMAL HIGH (ref 96–106)
Creatinine, Ser: 0.98 mg/dL (ref 0.57–1.00)
Globulin, Total: 2 g/dL (ref 1.5–4.5)
Glucose: 88 mg/dL (ref 70–99)
Potassium: 4.4 mmol/L (ref 3.5–5.2)
Sodium: 140 mmol/L (ref 134–144)
Total Protein: 6.4 g/dL (ref 6.0–8.5)
eGFR: 74 mL/min/{1.73_m2} (ref >59)

## 2021-09-28 LAB — TSH: TSH: 1.63 u[IU]/mL (ref 0.450–4.500)

## 2021-09-28 LAB — VITAMIN D 25 HYDROXY (VIT D DEFICIENCY, FRACTURES): Vit D, 25-Hydroxy: 30.9 ng/mL (ref 30.0–100.0)

## 2021-09-28 LAB — VITAMIN B12: Vitamin B-12: 842 pg/mL (ref 232–1245)

## 2021-09-28 LAB — LYME DISEASE SEROLOGY W/REFLEX: Lyme Total Antibody EIA: NEGATIVE

## 2021-09-30 LAB — QUANTIFERON-TB GOLD PLUS
QuantiFERON Mitogen Value: >10 IU/mL
QuantiFERON Nil Value: 0.03 IU/mL
QuantiFERON TB1 Ag Value: 0.04 IU/mL
QuantiFERON TB2 Ag Value: 0.04 IU/mL
QuantiFERON-TB Gold Plus: NEGATIVE

## 2021-10-28 NOTE — Progress Notes (Deleted)
There were no vitals taken for this visit.   Subjective:    Patient ID: Lynn Vargas, female    DOB: 1980-11-07, 41 y.o.   MRN: [id number]  HPI: Lynn Vargas is a 41 y.o. female presenting on 10/31/2021 for comprehensive medical examination. Current medical complaints include:{Blank single:19197::"none","***"}  She currently lives with: Menopausal Symptoms: {Blank single:19197::"yes","no"}  ANEMIA Anemia status: {Blank single:19197::"controlled","uncontrolled","better","worse","exacerbated","stable"} Etiology of anemia: Duration of anemia treatment:  Compliance with treatment: {Blank single:19197::"excellent compliance","good compliance","fair compliance","poor compliance"} Iron supplementation side effects: {Blank single:19197::"yes","no"} Severity of anemia: {Blank single:19197::"mild","moderate","severe"} Fatigue: {Blank single:19197::"yes","no"} Decreased exercise tolerance: {Blank single:19197::"yes","no"}  Dyspnea on exertion: {Blank single:19197::"yes","no"} Palpitations: {Blank single:19197::"yes","no"} Bleeding: {Blank single:19197::"yes","no"} Pica: {Blank single:19197::"yes","no"}  Depression Screen done today and results listed below:     09/27/2021    1:48 PM 05/09/2021    4:29 PM 01/03/2021    2:13 PM 10/14/2020   10:30 AM 07/31/2018    4:06 PM  Depression screen PHQ 2/9  Decreased Interest 3 1 0 3 3  Down, Depressed, Hopeless 3 1 1 3 3   PHQ - 2 Score 6 2 1 6 6   Altered sleeping 0 1 2 3 1   Tired, decreased energy 3 1 1 3 2   Change in appetite 2 1 1 3 3   Feeling bad or failure about yourself  0 0 0 3 0  Trouble concentrating 0 0 0 0 1  Moving slowly or fidgety/restless 2 0 0 0 0  Suicidal thoughts 0 0 0 0 0  PHQ-9 Score 13 5 5 18 13   Difficult doing work/chores Somewhat difficult Somewhat difficult Somewhat difficult Very difficult     The patient {has/does not have:19849} a history of falls. I {did/did not:19850} complete a risk assessment for falls. A  plan of care for falls {was/was not:19852} documented.   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   Hypertension    after pregnancy   Shortness of breath    UTI (lower urinary tract infection)     Surgical History:  Past Surgical History:  Procedure Laterality Date   CESAREAN SECTION     Pionidal cyst      Medications:  Current Outpatient Medications on File Prior to Visit  Medication Sig   buPROPion (WELLBUTRIN XL) 300 MG 24 hr tablet Take 1 tablet (300 mg total) by mouth daily.   valACYclovir (VALTREX) 1000 MG tablet TAKE 1 TABLET (1,000 MG TOTAL) BY MOUTH DAILY. (Patient not taking: Reported on 09/27/2021)   No current facility-administered medications on file prior to visit.    Allergies:  Allergies  Allergen Reactions   Latex     rash    Social History:  Social History   Socioeconomic History   Marital status: Divorced    Spouse name: Not on file   Number of children: Not on file   Years of education: Not on file   Highest education level: Not on file  Occupational History   Not on file  Tobacco Use   Smoking status: Never   Smokeless tobacco: Never  Vaping Use   Vaping Use: Never used  Substance  and Sexual Activity   Alcohol use: No   Drug use: No   Sexual activity: Not Currently  Other Topics Concern   Not on file  Social History Narrative   Not on file   Social Determinants of Health   Financial Resource Strain: Not on file  Food Insecurity: Not on file  Transportation Needs: Not on file  Physical Activity: Not on file  Stress: Not on file  Social Connections: Not on file  Intimate Partner Violence: Not on file   Social  History   Tobacco Use  Smoking Status Never  Smokeless Tobacco Never   Social History   Substance and Sexual Activity  Alcohol Use No    Family History:  Family History  Problem Relation Age of Onset   Asthma Mother    Cancer Daughter        placenta cancer   Diabetes Maternal Aunt    Heart disease Maternal Grandmother    Stroke Neg Hx     Past medical history, surgical history, medications, allergies, family history and social history reviewed with patient today and changes made to appropriate areas of the chart.   ROS All other ROS negative except what is listed above and in the HPI.      Objective:    There were no vitals taken for this visit.  Wt Readings from Last 3 Encounters:  09/27/21 222 lb 9.6 oz (101 kg)  05/26/21 213 lb (96.6 kg)  04/20/21 207 lb (93.9 kg)    Physical Exam  Results for orders placed or performed in visit on 09/27/21  CBC with Differential/Platelet  Result Value Ref Range   WBC 6.3 3.4 - 10.8 x10E3/uL   RBC 4.42 3.77 - 5.28 x10E6/uL   Hemoglobin 12.8 11.1 - 15.9 g/dL   Hematocrit 39.2 34.0 - 46.6 %   MCV 89 79 - 97 fL   MCH 29.0 26.6 - 33.0 pg   MCHC 32.7 31.5 - 35.7 g/dL   RDW 13.2 11.7 - 15.4 %   Platelets 262 150 - 450 x10E3/uL   Neutrophils 57 Not Estab. %   Lymphs 32 Not Estab. %   Monocytes 7 Not Estab. %   Eos 3 Not Estab. %   Basos 1 Not Estab. %   Neutrophils Absolute 3.5 1.4 - 7.0 x10E3/uL   Lymphocytes Absolute 2.0 0.7 - 3.1 x10E3/uL   Monocytes Absolute 0.5 0.1 - 0.9 x10E3/uL   EOS (ABSOLUTE) 0.2 0.0 - 0.4 x10E3/uL   Basophils Absolute 0.1 0.0 - 0.2 x10E3/uL   Immature Granulocytes 0 Not Estab. %   Immature Grans (Abs) 0.0 0.0 - 0.1 x10E3/uL  Comprehensive metabolic panel  Result Value Ref Range   Glucose 88 70 - 99 mg/dL   BUN 12 6 - 24 mg/dL   Creatinine, Ser 0.98 0.57 - 1.00 mg/dL   eGFR 74 >59 mL/min/1.73   BUN/Creatinine Ratio 12 9 - 23   Sodium 140 134 - 144 mmol/L   Potassium 4.4 3.5 - 5.2 mmol/L    Chloride 108 (H) 96 - 106 mmol/L   CO2 23 20 - 29 mmol/L   Calcium 9.6 8.7 - 10.2 mg/dL   Total Protein 6.4 6.0 - 8.5 g/dL   Albumin 4.4 3.8 - 4.8 g/dL   Globulin, Total 2.0 1.5 - 4.5 g/dL   Albumin/Globulin Ratio 2.2 1.2 - 2.2   Bilirubin Total 0.2 0.0 - 1.2 mg/dL   Alkaline Phosphatase 48 44 - 121 IU/L   AST 18 0 -  40 IU/L   ALT 22 0 - 32 IU/L  TSH  Result Value Ref Range   TSH 1.630 0.450 - 4.500 uIU/mL  VITAMIN D 25 Hydroxy (Vit-D Deficiency, Fractures)  Result Value Ref Range   Vit D, 25-Hydroxy 30.9 30.0 - 100.0 ng/mL  Vitamin B12  Result Value Ref Range   Vitamin B-12 842 232 - 1,245 pg/mL  Lyme Disease Serology w/Reflex  Result Value Ref Range   Lyme Total Antibody EIA Negative Negative  QuantiFERON-TB Gold Plus  Result Value Ref Range   QuantiFERON Incubation Incubation performed.    QuantiFERON Criteria Comment    QuantiFERON TB1 Ag Value 0.04 IU/mL   QuantiFERON TB2 Ag Value 0.04 IU/mL   QuantiFERON Nil Value 0.03 IU/mL   QuantiFERON Mitogen Value >10.00 IU/mL   QuantiFERON-TB Gold Plus Negative Negative      Assessment & Plan:   Problem List Items Addressed This Visit       Other   Depression   Anxiety   Anemia   Obesity (BMI 35.0-39.9 without comorbidity)   Vitamin D deficiency   Other Visit Diagnoses     Annual physical exam    -  Primary        Follow up plan: No follow-ups on file.   LABORATORY TESTING:  - Pap smear: {Blank LSLHTD:42876::"OTL done","not applicable","up to date","done elsewhere"}  IMMUNIZATIONS:   - Tdap: Tetanus vaccination status reviewed: {tetanus status:315746}. - Influenza: {Blank single:19197::"Up to date","Administered today","Postponed to flu season","Refused","Given elsewhere"} - Pneumovax: {Blank single:19197::"Up to date","Administered today","Not applicable","Refused","Given elsewhere"} - Prevnar: {Blank single:19197::"Up to date","Administered today","Not applicable","Refused","Given elsewhere"} - COVID:  {Blank single:19197::"Up to date","Administered today","Not applicable","Refused","Given elsewhere"} - HPV: {Blank single:19197::"Up to date","Administered today","Not applicable","Refused","Given elsewhere"} - Shingrix vaccine: {Blank single:19197::"Up to date","Administered today","Not applicable","Refused","Given elsewhere"}  SCREENING: -Mammogram: {Blank single:19197::"Up to date","Ordered today","Not applicable","Refused","Done elsewhere"}  - Colonoscopy: {Blank single:19197::"Up to date","Ordered today","Not applicable","Refused","Done elsewhere"}  - Bone Density: {Blank single:19197::"Up to date","Ordered today","Not applicable","Refused","Done elsewhere"}  -Hearing Test: {Blank single:19197::"Up to date","Ordered today","Not applicable","Refused","Done elsewhere"}  -Spirometry: {Blank single:19197::"Up to date","Ordered today","Not applicable","Refused","Done elsewhere"}   PATIENT COUNSELING:   Advised to take 1 mg of folate supplement per day if capable of pregnancy.   Sexuality: Discussed sexually transmitted diseases, partner selection, use of condoms, avoidance of unintended pregnancy  and contraceptive alternatives.   Advised to avoid cigarette smoking.  I discussed with the patient that most people either abstain from alcohol or drink within safe limits (<=14/week and <=4 drinks/occasion for males, <=7/weeks and <= 3 drinks/occasion for females) and that the risk for alcohol disorders and other health effects rises proportionally with the number of drinks per week and how often a drinker exceeds daily limits.  Discussed cessation/primary prevention of drug use and availability of treatment for abuse.   Diet: Encouraged to adjust caloric intake to maintain  or achieve ideal body weight, to reduce intake of dietary saturated fat and total fat, to limit sodium intake by avoiding high sodium foods and not adding table salt, and to maintain adequate dietary potassium and calcium  preferably from fresh fruits, vegetables, and low-fat dairy products.    stressed the importance of regular exercise  Injury prevention: Discussed safety belts, safety helmets, smoke detector, smoking near bedding or upholstery.   Dental health: Discussed importance of regular tooth brushing, flossing, and dental visits.    NEXT PREVENTATIVE PHYSICAL DUE IN 1 YEAR. No follow-ups on file.

## 2021-10-31 ENCOUNTER — Ambulatory Visit: Payer: BC Managed Care – PPO | Admitting: Nurse Practitioner

## 2021-10-31 ENCOUNTER — Encounter: Payer: BC Managed Care – PPO | Admitting: Nurse Practitioner

## 2021-11-02 NOTE — Progress Notes (Deleted)
There were no vitals taken for this visit.   Subjective:    Patient ID: Lynn Vargas, female    DOB: Mar 08, 1981, 41 y.o.   MRN: [id number]  HPI: Lynn Vargas is a 41 y.o. female presenting on 11/03/2021 for comprehensive medical examination. Current medical complaints include:{Blank single:19197::"none","***"}  She currently lives with: Menopausal Symptoms: {Blank single:19197::"yes","no"}  ANEMIA Anemia status: {Blank single:19197::"controlled","uncontrolled","better","worse","exacerbated","stable"} Etiology of anemia: Duration of anemia treatment:  Compliance with treatment: {Blank single:19197::"excellent compliance","good compliance","fair compliance","poor compliance"} Iron supplementation side effects: {Blank single:19197::"yes","no"} Severity of anemia: {Blank single:19197::"mild","moderate","severe"} Fatigue: {Blank single:19197::"yes","no"} Decreased exercise tolerance: {Blank single:19197::"yes","no"}  Dyspnea on exertion: {Blank single:19197::"yes","no"} Palpitations: {Blank single:19197::"yes","no"} Bleeding: {Blank single:19197::"yes","no"} Pica: {Blank single:19197::"yes","no"}  Depression Screen done today and results listed below:     09/27/2021    1:48 PM 05/09/2021    4:29 PM 01/03/2021    2:13 PM 10/14/2020   10:30 AM 07/31/2018    4:06 PM  Depression screen PHQ 2/9  Decreased Interest 3 1 0 3 3  Down, Depressed, Hopeless _0 PHQ - 2 Score _1 Altered sleeping 0 _2 Tired, decreased energy _3 Change in appetite _4 Feeling bad or failure about yourself  0 0 0 3 0  Trouble concentrating 0 0 0 0 1  Moving slowly or fidgety/restless 2 0 0 0 0  Suicidal thoughts 0 0 0 0 0  PHQ-9 Score _5 Difficult doing work/chores Somewhat difficult Somewhat difficult Somewhat difficult Very difficult     The patient {has/does not have:19849} a history of falls. I {did/did not:19850} complete a risk assessment for falls. A  plan of care for falls {was/was not:19852} documented.   Past Medical History:  Past Medical History:  Diagnosis Date  . Anemia   . Anxiety   . Cyst of perianal area    removed  . Depression   . 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 Gestational diabetes    diet controlled  . 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  . Hypertension    after pregnancy  . Shortness of breath   . UTI (lower urinary tract infection)     Surgical History:  Past Surgical History:  Procedure Laterality Date  . CESAREAN SECTION    . Pionidal cyst      Medications:  Current Outpatient Medications on File Prior to Visit  Medication Sig  . buPROPion (WELLBUTRIN XL) 300 MG 24 hr tablet Take 1 tablet (300 mg total) by mouth daily.  . valACYclovir (VALTREX) 1000 MG tablet TAKE 1 TABLET (1,000 MG TOTAL) BY MOUTH DAILY. (Patient not taking: Reported on 09/27/2021)   No current facility-administered medications on file prior to visit.    Allergies:  Allergies  Allergen Reactions  . Latex     rash    Social History:  Social History   Socioeconomic History  . Marital status: Divorced    Spouse name: Not on file  . Number of children: Not on file  . Years of education: Not on file  . Highest education level: Not on file  Occupational History  . Not on file  Tobacco Use  . Smoking status: Never  . Smokeless tobacco: Never  Vaping Use  . Vaping Use: Never used  Substance  and Sexual Activity  . Alcohol use: No  . Drug use: No  . Sexual activity: Not Currently  Other Topics Concern  . Not on file  Social History Narrative  . Not on file   Social Determinants of Health   Financial Resource Strain: Not on file  Food Insecurity: Not on file  Transportation Needs: Not on file  Physical Activity: Not on file  Stress: Not on file  Social Connections: Not on file  Intimate Partner  Violence: Not on file   Social History   Tobacco Use  Smoking Status Never  Smokeless Tobacco Never   Social History   Substance and Sexual Activity  Alcohol Use No    Family History:  Family History  Problem Relation Age of Onset  . Asthma Mother   . Cancer Daughter        placenta cancer  . Diabetes Maternal Aunt   . Heart disease Maternal Grandmother   . Stroke Neg Hx     Past medical history, surgical history, medications, allergies, family history and social history reviewed with patient today and changes made to appropriate areas of the chart.   ROS All other ROS negative except what is listed above and in the HPI.      Objective:    There were no vitals taken for this visit.  Wt Readings from Last 3 Encounters:  09/27/21 222 lb 9.6 oz (101 kg)  05/26/21 213 lb (96.6 kg)  04/20/21 207 lb (93.9 kg)    Physical Exam  Results for orders placed or performed in visit on 09/27/21  CBC with Differential/Platelet  Result Value Ref Range   WBC 6.3 3.4 - 10.8 x10E3/uL   RBC 4.42 3.77 - 5.28 x10E6/uL   Hemoglobin 12.8 11.1 - 15.9 g/dL   Hematocrit 39.2 34.0 - 46.6 %   MCV 89 79 - 97 fL   MCH 29.0 26.6 - 33.0 pg   MCHC 32.7 31.5 - 35.7 g/dL   RDW 13.2 11.7 - 15.4 %   Platelets 262 150 - 450 x10E3/uL   Neutrophils 57 Not Estab. %   Lymphs 32 Not Estab. %   Monocytes 7 Not Estab. %   Eos 3 Not Estab. %   Basos 1 Not Estab. %   Neutrophils Absolute 3.5 1.4 - 7.0 x10E3/uL   Lymphocytes Absolute 2.0 0.7 - 3.1 x10E3/uL   Monocytes Absolute 0.5 0.1 - 0.9 x10E3/uL   EOS (ABSOLUTE) 0.2 0.0 - 0.4 x10E3/uL   Basophils Absolute 0.1 0.0 - 0.2 x10E3/uL   Immature Granulocytes 0 Not Estab. %   Immature Grans (Abs) 0.0 0.0 - 0.1 x10E3/uL  Comprehensive metabolic panel  Result Value Ref Range   Glucose 88 70 - 99 mg/dL   BUN 12 6 - 24 mg/dL   Creatinine, Ser 0.98 0.57 - 1.00 mg/dL   eGFR 74 >59 mL/min/1.73   BUN/Creatinine Ratio 12 9 - 23   Sodium 140 134 - 144 mmol/L    Potassium 4.4 3.5 - 5.2 mmol/L   Chloride 108 (H) 96 - 106 mmol/L   CO2 23 20 - 29 mmol/L   Calcium 9.6 8.7 - 10.2 mg/dL   Total Protein 6.4 6.0 - 8.5 g/dL   Albumin 4.4 3.8 - 4.8 g/dL   Globulin, Total 2.0 1.5 - 4.5 g/dL   Albumin/Globulin Ratio 2.2 1.2 - 2.2   Bilirubin Total 0.2 0.0 - 1.2 mg/dL   Alkaline Phosphatase 48 44 - 121 IU/L   AST 18 0 -  40 IU/L   ALT 22 0 - 32 IU/L  TSH  Result Value Ref Range   TSH 1.630 0.450 - 4.500 uIU/mL  VITAMIN D 25 Hydroxy (Vit-D Deficiency, Fractures)  Result Value Ref Range   Vit D, 25-Hydroxy 30.9 30.0 - 100.0 ng/mL  Vitamin B12  Result Value Ref Range   Vitamin B-12 842 232 - 1,245 pg/mL  Lyme Disease Serology w/Reflex  Result Value Ref Range   Lyme Total Antibody EIA Negative Negative  QuantiFERON-TB Gold Plus  Result Value Ref Range   QuantiFERON Incubation Incubation performed.    QuantiFERON Criteria Comment    QuantiFERON TB1 Ag Value 0.04 IU/mL   QuantiFERON TB2 Ag Value 0.04 IU/mL   QuantiFERON Nil Value 0.03 IU/mL   QuantiFERON Mitogen Value >10.00 IU/mL   QuantiFERON-TB Gold Plus Negative Negative      Assessment & Plan:   Problem List Items Addressed This Visit   None    Follow up plan: No follow-ups on file.   LABORATORY TESTING:  - Pap smear: {Blank DJMEQA:83419::"QQI done","not applicable","up to date","done elsewhere"}  IMMUNIZATIONS:   - Tdap: Tetanus vaccination status reviewed: {tetanus status:315746}. - Influenza: {Blank single:19197::"Up to date","Administered today","Postponed to flu season","Refused","Given elsewhere"} - Pneumovax: {Blank single:19197::"Up to date","Administered today","Not applicable","Refused","Given elsewhere"} - Prevnar: {Blank single:19197::"Up to date","Administered today","Not applicable","Refused","Given elsewhere"} - COVID: {Blank single:19197::"Up to date","Administered today","Not applicable","Refused","Given elsewhere"} - HPV: {Blank single:19197::"Up to  date","Administered today","Not applicable","Refused","Given elsewhere"} - Shingrix vaccine: {Blank single:19197::"Up to date","Administered today","Not applicable","Refused","Given elsewhere"}  SCREENING: -Mammogram: {Blank single:19197::"Up to date","Ordered today","Not applicable","Refused","Done elsewhere"}  - Colonoscopy: {Blank single:19197::"Up to date","Ordered today","Not applicable","Refused","Done elsewhere"}  - Bone Density: {Blank single:19197::"Up to date","Ordered today","Not applicable","Refused","Done elsewhere"}  -Hearing Test: {Blank single:19197::"Up to date","Ordered today","Not applicable","Refused","Done elsewhere"}  -Spirometry: {Blank single:19197::"Up to date","Ordered today","Not applicable","Refused","Done elsewhere"}   PATIENT COUNSELING:   Advised to take 1 mg of folate supplement per day if capable of pregnancy.   Sexuality: Discussed sexually transmitted diseases, partner selection, use of condoms, avoidance of unintended pregnancy  and contraceptive alternatives.   Advised to avoid cigarette smoking.  I discussed with the patient that most people either abstain from alcohol or drink within safe limits (<=14/week and <=4 drinks/occasion for males, <=7/weeks and <= 3 drinks/occasion for females) and that the risk for alcohol disorders and other health effects rises proportionally with the number of drinks per week and how often a drinker exceeds daily limits.  Discussed cessation/primary prevention of drug use and availability of treatment for abuse.   Diet: Encouraged to adjust caloric intake to maintain  or achieve ideal body weight, to reduce intake of dietary saturated fat and total fat, to limit sodium intake by avoiding high sodium foods and not adding table salt, and to maintain adequate dietary potassium and calcium preferably from fresh fruits, vegetables, and low-fat dairy products.    stressed the importance of regular exercise  Injury prevention:  Discussed safety belts, safety helmets, smoke detector, smoking near bedding or upholstery.   Dental health: Discussed importance of regular tooth brushing, flossing, and dental visits.    NEXT PREVENTATIVE PHYSICAL DUE IN 1 YEAR. No follow-ups on file.

## 2021-11-03 ENCOUNTER — Ambulatory Visit: Payer: BC Managed Care – PPO | Admitting: Nurse Practitioner

## 2021-11-03 DIAGNOSIS — F419 Anxiety disorder, unspecified: Secondary | ICD-10-CM

## 2021-11-03 DIAGNOSIS — Z0000 Encounter for general adult medical examination without abnormal findings: Secondary | ICD-10-CM

## 2021-11-03 DIAGNOSIS — F330 Major depressive disorder, recurrent, mild: Secondary | ICD-10-CM

## 2021-11-03 DIAGNOSIS — E669 Obesity, unspecified: Secondary | ICD-10-CM

## 2021-11-03 DIAGNOSIS — D649 Anemia, unspecified: Secondary | ICD-10-CM

## 2021-11-03 DIAGNOSIS — E559 Vitamin D deficiency, unspecified: Secondary | ICD-10-CM

## 2021-11-03 DIAGNOSIS — Z136 Encounter for screening for cardiovascular disorders: Secondary | ICD-10-CM

## 2021-11-29 ENCOUNTER — Encounter: Payer: Self-pay | Admitting: Physician Assistant

## 2021-11-29 ENCOUNTER — Ambulatory Visit: Payer: BC Managed Care – PPO | Admitting: Physician Assistant

## 2021-11-29 VITALS — BP 126/90 | HR 84 | Temp 98.3°F | Wt 215.4 lb

## 2021-11-29 DIAGNOSIS — R5382 Chronic fatigue, unspecified: Secondary | ICD-10-CM | POA: Insufficient documentation

## 2021-11-29 NOTE — Assessment & Plan Note (Signed)
Chronic, ongoing Reviewed previous encounter with Lynn Vargas,vFNP from March 2023 Reviewed CMP, CBC, TSH, Lyme antibody, Vitamin D and B12 results - no significant findings that would account for degree of fatigue and sleepiness Epworth score of 17/24 indicating pathologic sleepiness Will reorder polysomnography to evaluate for OSA as patient admits to being told she snores Results to dictate further management

## 2021-11-29 NOTE — Progress Notes (Signed)
Established Patient Office Visit  Name: Lynn Vargas   MRN: [id number]    DOB: 22-May-1981   Date:11/29/2021  Today's Provider: Talitha Givens, MHS, PA-C Introduced myself to the patient as a PA-C and provided education on APPs in clinical practice.         Subjective  Chief Complaint  Chief Complaint  Patient presents with   Fatigue    Pt states she has been having a lot of fatigue for the last year. Wonders if she is in pre-menopause. States she has felt this way since receiving the covid vaccine last year     HPI  Reports she has been fatigued since last Jan  States she got the COVID vaccine then   She reports her periods were irregular last year when she was under a lot of stress but now seem more regular  States she is back to normal with her periods after her change in workload  Main concern is constant tiredness Reports she started taking Vitamin D when she was last prescribed the Wellbutrin   States she has been told she snores a lot  Epworth Sleepiness Score 17/24     Patient Active Problem List   Diagnosis Date Noted   Chronic fatigue 11/29/2021   Pelvic pain 05/26/2021   Dizziness 09/18/2020   Vitamin D deficiency 09/18/2020   Elevated low density lipoprotein (LDL) cholesterol level 09/18/2020   Obesity (BMI 35.0-39.9 without comorbidity) 03/15/2018   Depression    Anxiety    Anemia 04/14/2011   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    Past Surgical History:  Procedure Laterality Date   CESAREAN SECTION     Pionidal cyst      Family History  Problem Relation Age of Onset   Asthma Mother    Cancer Daughter        placenta cancer   Diabetes Maternal Aunt    Heart disease Maternal Grandmother    Stroke Neg Hx     Social History   Tobacco Use   Smoking status: Never   Smokeless tobacco: Never  Substance Use Topics   Alcohol use: No     Current Outpatient Medications:    buPROPion (WELLBUTRIN XL) 300 MG 24 hr tablet, Take 1 tablet  (300 mg total) by mouth daily., Disp: 90 tablet, Rfl: 1   VITAMIN D PO, Take 5,000 Units by mouth daily., Disp: , Rfl:   Allergies  Allergen Reactions   Latex     rash     Review of Systems  Constitutional:  Positive for malaise/fatigue.  Psychiatric/Behavioral:  The patient does not have insomnia.      Objective  Vitals:   11/29/21 1356  BP: 126/90  Pulse: 84  Temp: 98.3 F (36.8 C)  TempSrc: Oral  SpO2: 99%  Weight: 215 lb 6.4 oz (97.7 kg)    Body mass index is 37.56 kg/m.  Physical Exam Vitals reviewed.  Constitutional:      General: She is awake.     Appearance: Normal appearance. She is well-developed and well-groomed. She is obese.  HENT:     Head: Normocephalic and atraumatic.  Cardiovascular:     Rate and Rhythm: Normal rate and regular rhythm.     Pulses: Normal pulses.     Heart sounds: Normal heart sounds.  Pulmonary:     Effort: Pulmonary effort is normal.     Breath sounds: Normal breath sounds and air entry. No decreased breath  sounds, wheezing, rhonchi or rales.  Musculoskeletal:     Right lower leg: No edema.     Left lower leg: No edema.  Neurological:     Mental Status: She is alert.  Psychiatric:        Attention and Perception: Attention normal.        Mood and Affect: Mood and affect normal.        Behavior: Behavior normal. Behavior is cooperative.     Recent Results (from the past 2160 hour(s))  CBC with Differential/Platelet     Status: None   Collection Time: 09/27/21  2:52 PM  Result Value Ref Range   WBC 6.3 3.4 - 10.8 x10E3/uL   RBC 4.42 3.77 - 5.28 x10E6/uL   Hemoglobin 12.8 11.1 - 15.9 g/dL   Hematocrit 39.2 34.0 - 46.6 %   MCV 89 79 - 97 fL   MCH 29.0 26.6 - 33.0 pg   MCHC 32.7 31.5 - 35.7 g/dL   RDW 13.2 11.7 - 15.4 %   Platelets 262 150 - 450 x10E3/uL   Neutrophils 57 Not Estab. %   Lymphs 32 Not Estab. %   Monocytes 7 Not Estab. %   Eos 3 Not Estab. %   Basos 1 Not Estab. %   Neutrophils Absolute 3.5 1.4 -  7.0 x10E3/uL   Lymphocytes Absolute 2.0 0.7 - 3.1 x10E3/uL   Monocytes Absolute 0.5 0.1 - 0.9 x10E3/uL   EOS (ABSOLUTE) 0.2 0.0 - 0.4 x10E3/uL   Basophils Absolute 0.1 0.0 - 0.2 x10E3/uL   Immature Granulocytes 0 Not Estab. %   Immature Grans (Abs) 0.0 0.0 - 0.1 x10E3/uL  Comprehensive metabolic panel     Status: Abnormal   Collection Time: 09/27/21  2:52 PM  Result Value Ref Range   Glucose 88 70 - 99 mg/dL   BUN 12 6 - 24 mg/dL   Creatinine, Ser 0.98 0.57 - 1.00 mg/dL   eGFR 74 >59 mL/min/1.73   BUN/Creatinine Ratio 12 9 - 23   Sodium 140 134 - 144 mmol/L   Potassium 4.4 3.5 - 5.2 mmol/L   Chloride 108 (H) 96 - 106 mmol/L   CO2 23 20 - 29 mmol/L   Calcium 9.6 8.7 - 10.2 mg/dL   Total Protein 6.4 6.0 - 8.5 g/dL   Albumin 4.4 3.8 - 4.8 g/dL   Globulin, Total 2.0 1.5 - 4.5 g/dL   Albumin/Globulin Ratio 2.2 1.2 - 2.2   Bilirubin Total 0.2 0.0 - 1.2 mg/dL   Alkaline Phosphatase 48 44 - 121 IU/L   AST 18 0 - 40 IU/L   ALT 22 0 - 32 IU/L  TSH     Status: None   Collection Time: 09/27/21  2:52 PM  Result Value Ref Range   TSH 1.630 0.450 - 4.500 uIU/mL  VITAMIN D 25 Hydroxy (Vit-D Deficiency, Fractures)     Status: None   Collection Time: 09/27/21  2:52 PM  Result Value Ref Range   Vit D, 25-Hydroxy 30.9 30.0 - 100.0 ng/mL    Comment: Vitamin D deficiency has been defined by the Institute of Medicine and an Endocrine Society practice guideline as a level of serum 25-OH vitamin D less than 20 ng/mL (1,2). The Endocrine Society went on to further define vitamin D insufficiency as a level between 21 and 29 ng/mL (2). 1. IOM (Institute of Medicine). 2010. Dietary reference    intakes for calcium and D. [place]: The    [employer]. 2. Holick  MF, Binkley Archuleta, Bischoff-Ferrari HA, et al.    Evaluation, treatment, and prevention of vitamin D    deficiency: an Endocrine Society clinical practice    guideline. JCEM. 2011 Jul; 96(7):1911-30.   Vitamin B12     Status:  None   Collection Time: 09/27/21  2:52 PM  Result Value Ref Range   Vitamin B-12 842 232 - 1,245 pg/mL  Lyme Disease Serology w/Reflex     Status: None   Collection Time: 09/27/21  2:52 PM  Result Value Ref Range   Lyme Total Antibody EIA Negative Negative    Comment: Lyme antibodies not detected. Reflex testing is not indicated. No laboratory evidence of infection with B. burgdorferi (Lyme disease). Negative results may occur in patients recently infected (less than or equal to 14 days) with B. burgdorferi.  If recent infection is suspected, repeat testing on a new sample collected in 7 to 14 days is recommended.   QuantiFERON-TB Gold Plus     Status: None   Collection Time: 09/27/21  4:21 PM  Result Value Ref Range   QuantiFERON Incubation Incubation performed.    QuantiFERON Criteria Comment     Comment: QuantiFERON-TB Gold Plus is a qualitative indirect test for M tuberculosis infection (including disease) and is intended for use in conjunction with risk assessment, radiography, and other medical and diagnostic evaluations. The QuantiFERON-TB Gold Plus result is determined by subtracting the Nil value from either TB antigen (Ag) value. The Mitogen tube serves as a control for the test.    QuantiFERON TB1 Ag Value 0.04 IU/mL   QuantiFERON TB2 Ag Value 0.04 IU/mL   QuantiFERON Nil Value 0.03 IU/mL   QuantiFERON Mitogen Value >10.00 IU/mL   QuantiFERON-TB Gold Plus Negative Negative    Comment: No response to M tuberculosis antigens detected. Infection with M tuberculosis is unlikely, but high risk individuals should be considered for additional testing (ATS/IDSA/CDC Clinical Practice Guidelines, 2017). The reference range is an Antigen minus Nil result of <0.35 IU/mL. Chemiluminescence immunoassay methodology      PHQ2/9:    09/27/2021    1:48 PM 05/09/2021    4:29 PM 01/03/2021    2:13 PM 10/14/2020   10:30 AM 07/31/2018    4:06 PM  Depression screen PHQ 2/9   Decreased Interest 3 1 0 3 3  Down, Depressed, Hopeless 3 1 1 3 3   PHQ - 2 Score 6 2 1 6 6   Altered sleeping 0 1 2 3 1   Tired, decreased energy 3 1 1 3 2   Change in appetite 2 1 1 3 3   Feeling bad or failure about yourself  0 0 0 3 0  Trouble concentrating 0 0 0 0 1  Moving slowly or fidgety/restless 2 0 0 0 0  Suicidal thoughts 0 0 0 0 0  PHQ-9 Score 13 5 5 18 13   Difficult doing work/chores Somewhat difficult Somewhat difficult Somewhat difficult Very difficult       Fall Risk:    09/27/2021    1:46 PM 10/31/2018    2:09 PM 08/28/2016   10:22 AM  [place] in the past year? 0 0 No  Number falls in past yr: 0    Injury with Fall? 0    Risk for fall due to : No Fall Risks    Follow up Falls evaluation completed Falls evaluation completed       Functional Status Survey:      Assessment & Plan  Problem List Items  Addressed This Visit       Other   Chronic fatigue - Primary    Chronic, ongoing Reviewed previous encounter with Malachy Mood Wicker,vFNP from March 2023 Reviewed CMP, CBC, TSH, Lyme antibody, Vitamin D and B12 results - no significant findings that would account for degree of fatigue and sleepiness Epworth score of 17/24 indicating pathologic sleepiness Will reorder polysomnography to evaluate for OSA as patient admits to being told she snores Results to dictate further management        Relevant Orders   Nocturnal polysomnography (NPSG)     No follow-ups on file.   I, Gustie Bobb E Dariann Huckaba, PA-C, have reviewed all documentation for this visit. The documentation on 11/29/21 for the exam, diagnosis, procedures, and orders are all accurate and complete.   Talitha Givens, MHS, PA-C [facility]

## 2021-11-29 NOTE — Patient Instructions (Addendum)
I have placed a new referral for a sleep study to assess for obstructive sleep apnea  If you have not heard from a referral coordinator by Friday please call our office so we can further facilitate this process.

## 2021-12-22 ENCOUNTER — Encounter: Payer: Self-pay | Admitting: Nurse Practitioner

## 2021-12-22 DIAGNOSIS — R5382 Chronic fatigue, unspecified: Secondary | ICD-10-CM

## 2022-01-02 ENCOUNTER — Ambulatory Visit: Payer: Self-pay | Admitting: *Deleted

## 2022-01-02 ENCOUNTER — Encounter: Payer: Self-pay | Admitting: Primary Care

## 2022-01-02 ENCOUNTER — Ambulatory Visit (INDEPENDENT_AMBULATORY_CARE_PROVIDER_SITE_OTHER): Payer: BC Managed Care – PPO | Admitting: Primary Care

## 2022-01-02 VITALS — BP 136/90 | HR 95 | Temp 98.2°F | Ht 63.5 in | Wt 222.2 lb

## 2022-01-02 DIAGNOSIS — R030 Elevated blood-pressure reading, without diagnosis of hypertension: Secondary | ICD-10-CM

## 2022-01-02 DIAGNOSIS — R0683 Snoring: Secondary | ICD-10-CM | POA: Diagnosis not present

## 2022-01-02 DIAGNOSIS — F330 Major depressive disorder, recurrent, mild: Secondary | ICD-10-CM

## 2022-01-02 DIAGNOSIS — Z9189 Other specified personal risk factors, not elsewhere classified: Secondary | ICD-10-CM

## 2022-01-02 NOTE — Progress Notes (Signed)
Reviewed and agree with assessment/plan.   Coralyn Helling, MD [facility] Pulmonary/Critical Care 01/02/2022, 5:54 PM Pager:  [phone]

## 2022-01-02 NOTE — Assessment & Plan Note (Addendum)
-   Patient has symptoms of loud snoring, restless sleep and daytime sleepiness. Associated vision changes and elevated blood pressure. Epworth 19. BMI 38. Strong suspicion patient could have obstructive sleep apnea, needs in-lab polysomnography to evaluate. Discussed risk of untreated sleep apnea including cardiac arrhythmias, pulm HTN, stroke, DM. We briefly reviewed treatment options. She would not likely be a good candidate for oral appliance since she already has braces. Encouraged patient to work on weight loss efforts and focus on side sleeping position/elevate head of bed. Advised against driving if experiencing excessive daytime sleepiness. Follow-up in 6 weeks to review sleep study results and discuss treatment options further.

## 2022-01-03 ENCOUNTER — Ambulatory Visit: Payer: BC Managed Care – PPO | Admitting: Nurse Practitioner

## 2022-01-03 ENCOUNTER — Ambulatory Visit: Payer: Self-pay | Admitting: *Deleted

## 2022-01-03 ENCOUNTER — Encounter: Payer: Self-pay | Admitting: Nurse Practitioner

## 2022-01-03 VITALS — BP 138/84 | HR 86 | Temp 98.0°F | Ht 63.5 in | Wt 221.8 lb

## 2022-01-03 DIAGNOSIS — E669 Obesity, unspecified: Secondary | ICD-10-CM | POA: Diagnosis not present

## 2022-01-03 DIAGNOSIS — F419 Anxiety disorder, unspecified: Secondary | ICD-10-CM

## 2022-01-03 DIAGNOSIS — F330 Major depressive disorder, recurrent, mild: Secondary | ICD-10-CM

## 2022-01-03 DIAGNOSIS — R030 Elevated blood-pressure reading, without diagnosis of hypertension: Secondary | ICD-10-CM | POA: Diagnosis not present

## 2022-01-03 MED ORDER — CONTRAVE 8-90 MG PO TB12: ORAL_TABLET | ORAL | 2 refills | Status: DC

## 2022-01-03 MED ORDER — BUSPIRONE HCL 5 MG PO TABS: 5.0000 mg | ORAL_TABLET | Freq: Two times a day (BID) | ORAL | 3 refills | Status: DC

## 2022-01-03 NOTE — Progress Notes (Signed)
BP 138/84 (BP Location: Left Arm, Patient Position: Sitting, Cuff Size: Large)   Pulse 86   Temp 98 F (36.7 C) (Oral)   Ht 5' 3.5" (1.613 m)   Wt 221 lb 12.8 oz (100.6 kg)   SpO2 96%   BMI 38.67 kg/m    Subjective:    Patient ID: Lynn Vargas, female    DOB: Feb 23, 1981, 41 y.o.   MRN: [id number]  HPI: Lynn Vargas is a 41 y.o. female  Chief Complaint  Patient presents with   Hypertension    Patient says she went to her specialist yesterday for a sleep study and when they went to get vitals her BP was 140/100 the first time. Patient says the second time 130's/90. Patient was informed to follow up with her PCP. Patient says the lady who informed of her BP reading scared her and she was freaking out, so she made the appointment today.    Anxiety    Patient says she thinks some of her symptoms may be coming from her Anxiety. Patient states when she was last time she was prescribed Anxiety medication since Roosvelt Maser was here in office. Patient says she is frustrated because she is not getting proper bed side manner from everyone in regards to her help.    HYPERTENSION No current medications, recent elevations.  Went for sleep study and her BP was 140/100, then second BP was in 130/90 range.  This worried her and she was told to see her PCP.  Has high functional autistic child at home, age 80, and two other children -- is sole provider at home.   She is concerned about weight gain.  Her goal is to get to 145 lbs.  Has tried various options to lose weight.  Has been doing multivitamin to combat fatigue and supplements + Biotin and Vitamin D for weight loss.  Over past 6 months has tried exercising.  Is a [employer] driver.  Is walking for one hour daily.   Hypertension status: uncontrolled BP monitoring frequency:  not checking BP range:  Aspirin: no Recurrent headaches: no Visual changes: no Palpitations: no Dyspnea: no Chest pain:  some pressure recently Lower extremity edema:  no Dizzy/lightheaded: no   ANXIETY/STRESS Took Buspar in past with Wellbutrin. Duration:uncontrolled Anxious mood: yes  Excessive worrying: yes Irritability: yes  Sweating: no Nausea: no Palpitations:no Hyperventilation: no Panic attacks: no Agoraphobia: no  Obscessions/compulsions: no Depressed mood: no    01/03/2022    3:36 PM 09/27/2021    1:48 PM 05/09/2021    4:29 PM 01/03/2021    2:13 PM 10/14/2020   10:30 AM  Depression screen PHQ 2/9  Decreased Interest 0 3 1 0 3  Down, Depressed, Hopeless 2 3 1 1 3   PHQ - 2 Score 2 6 2 1 6   Altered sleeping 2 0 1 2 3   Tired, decreased energy 1 3 1 1 3   Change in appetite 2 2 1 1 3   Feeling bad or failure about yourself  0 0 0 0 3  Trouble concentrating 0 0 0 0 0  Moving slowly or fidgety/restless 2 2 0 0 0  Suicidal thoughts 0 0 0 0 0  PHQ-9 Score 9 13 5 5 18   Difficult doing work/chores Not difficult at all Somewhat difficult Somewhat difficult Somewhat difficult Very difficult  Anhedonia: no Weight changes: no Insomnia: yes hard to stay asleep -- to go for sleep study Hypersomnia: no Fatigue/loss of energy: yes Feelings of worthlessness:  no Feelings of guilt: no Impaired concentration/indecisiveness: no Suicidal ideations: no  Crying spells: no Recent Stressors/Life Changes: yes   Relationship problems: no   Family stress: yes     Financial stress: no    Job stress: no    Recent death/loss: no     01/22/2022    3:36 PM 09/27/2021    1:46 PM 05/09/2021    4:31 PM 10/14/2020   10:31 AM  GAD 7 : Generalized Anxiety Score  Nervous, Anxious, on Edge 3 3 2 3   Control/stop worrying 2 3 2 3   Worry too much - different things 3 3 2 3   Trouble relaxing 1 3 0 3  Restless 1 0 0 0  Easily annoyed or irritable 3 3 2 3   Afraid - awful might happen 3 2 1 2   Total GAD 7 Score 16 17 9 17   Anxiety Difficulty Somewhat difficult Somewhat difficult Somewhat difficult Extremely difficult    Relevant past medical, surgical, family and  social history reviewed and updated as indicated. Interim medical history since our last visit reviewed. Allergies and medications reviewed and updated.  Review of Systems  Constitutional:  Negative for activity change, appetite change, diaphoresis, fatigue and fever.  Respiratory:  Negative for cough, chest tightness and shortness of breath.   Cardiovascular:  Negative for chest pain, palpitations and leg swelling.  Gastrointestinal: Negative.   Neurological: Negative.   Psychiatric/Behavioral: Negative.      Per HPI unless specifically indicated above     Objective:    BP 138/84 (BP Location: Left Arm, Patient Position: Sitting, Cuff Size: Large)   Pulse 86   Temp 98 F (36.7 C) (Oral)   Ht 5' 3.5" (1.613 m)   Wt 221 lb 12.8 oz (100.6 kg)   SpO2 96%   BMI 38.67 kg/m   Wt Readings from Last 3 Encounters:  Jan 22, 2022 221 lb 12.8 oz (100.6 kg)  01/02/22 222 lb 3.2 oz (100.8 kg)  11/29/21 215 lb 6.4 oz (97.7 kg)    Physical Exam Vitals and nursing note reviewed.  Constitutional:      General: She is awake. She is not in acute distress.    Appearance: She is well-developed and well-groomed. She is morbidly obese. She is not ill-appearing or toxic-appearing.  HENT:     Head: Normocephalic.     Right Ear: Hearing normal.     Left Ear: Hearing normal.  Eyes:     General: Lids are normal.        Right eye: No discharge.        Left eye: No discharge.     Conjunctiva/sclera: Conjunctivae normal.     Pupils: Pupils are equal, round, and reactive to light.  Neck:     Thyroid: No thyromegaly.     Vascular: No carotid bruit.  Cardiovascular:     Rate and Rhythm: Normal rate and regular rhythm.     Heart sounds: Normal heart sounds. No murmur heard.    No gallop.  Pulmonary:     Effort: Pulmonary effort is normal. No accessory muscle usage or respiratory distress.     Breath sounds: Normal breath sounds.  Abdominal:     General: Bowel sounds are normal.     Palpations:  Abdomen is soft. There is no hepatomegaly or splenomegaly.  Musculoskeletal:     Cervical back: Normal range of motion and neck supple.     Right lower leg: No edema.     Left lower leg: No edema.  Skin:    General: Skin is warm and dry.  Neurological:     Mental Status: She is alert and oriented to person, place, and time.  Psychiatric:        Attention and Perception: Attention normal.        Mood and Affect: Mood normal.        Speech: Speech normal.        Behavior: Behavior normal. Behavior is cooperative.        Thought Content: Thought content normal.     Results for orders placed or performed in visit on 09/27/21  CBC with Differential/Platelet  Result Value Ref Range   WBC 6.3 3.4 - 10.8 x10E3/uL   RBC 4.42 3.77 - 5.28 x10E6/uL   Hemoglobin 12.8 11.1 - 15.9 g/dL   Hematocrit 40.9 81.1 - 46.6 %   MCV 89 79 - 97 fL   MCH 29.0 26.6 - 33.0 pg   MCHC 32.7 31.5 - 35.7 g/dL   RDW 91.4 78.2 - 95.6 %   Platelets 262 150 - 450 x10E3/uL   Neutrophils 57 Not Estab. %   Lymphs 32 Not Estab. %   Monocytes 7 Not Estab. %   Eos 3 Not Estab. %   Basos 1 Not Estab. %   Neutrophils Absolute 3.5 1.4 - 7.0 x10E3/uL   Lymphocytes Absolute 2.0 0.7 - 3.1 x10E3/uL   Monocytes Absolute 0.5 0.1 - 0.9 x10E3/uL   EOS (ABSOLUTE) 0.2 0.0 - 0.4 x10E3/uL   Basophils Absolute 0.1 0.0 - 0.2 x10E3/uL   Immature Granulocytes 0 Not Estab. %   Immature Grans (Abs) 0.0 0.0 - 0.1 x10E3/uL  Comprehensive metabolic panel  Result Value Ref Range   Glucose 88 70 - 99 mg/dL   BUN 12 6 - 24 mg/dL   Creatinine, Ser 2.13 0.57 - 1.00 mg/dL   eGFR 74 >08 MV/HQI/6.96   BUN/Creatinine Ratio 12 9 - 23   Sodium 140 134 - 144 mmol/L   Potassium 4.4 3.5 - 5.2 mmol/L   Chloride 108 (H) 96 - 106 mmol/L   CO2 23 20 - 29 mmol/L   Calcium 9.6 8.7 - 10.2 mg/dL   Total Protein 6.4 6.0 - 8.5 g/dL   Albumin 4.4 3.8 - 4.8 g/dL   Globulin, Total 2.0 1.5 - 4.5 g/dL   Albumin/Globulin Ratio 2.2 1.2 - 2.2   Bilirubin  Total 0.2 0.0 - 1.2 mg/dL   Alkaline Phosphatase 48 44 - 121 IU/L   AST 18 0 - 40 IU/L   ALT 22 0 - 32 IU/L  TSH  Result Value Ref Range   TSH 1.630 0.450 - 4.500 uIU/mL  VITAMIN D 25 Hydroxy (Vit-D Deficiency, Fractures)  Result Value Ref Range   Vit D, 25-Hydroxy 30.9 30.0 - 100.0 ng/mL  Vitamin B12  Result Value Ref Range   Vitamin B-12 842 232 - 1,245 pg/mL  Lyme Disease Serology w/Reflex  Result Value Ref Range   Lyme Total Antibody EIA Negative Negative  QuantiFERON-TB Gold Plus  Result Value Ref Range   QuantiFERON Incubation Incubation performed.    QuantiFERON Criteria Comment    QuantiFERON TB1 Ag Value 0.04 IU/mL   QuantiFERON TB2 Ag Value 0.04 IU/mL   QuantiFERON Nil Value 0.03 IU/mL   QuantiFERON Mitogen Value >10.00 IU/mL   QuantiFERON-TB Gold Plus Negative Negative      Assessment & Plan:   Problem List Items Addressed This Visit       Other   Anxiety  Refer to depression plan of care.      Relevant Medications   busPIRone (BUSPAR) 5 MG tablet   Depression - Primary    Chronic, ongoing although suspect more driven by anxiety.  Denies SI/HI.  Start Buspar 5 MG BID + starting Naltrexone for weight loss.  Monitor closely.  Recommend mediation and relaxation exercises daily.  Return in 6 weeks for follow-up with PCP.      Relevant Medications   busPIRone (BUSPAR) 5 MG tablet   Elevated blood pressure reading    Noted on initial BP today, but trending down during visit.  Suspect much related to anxiety.  At this time recommend heavy focus on DASH diet and printed her education on this.  Will trial diet changes only for upcoming weeks and if ongoing elevations consider addition of ARB, avoid ACE due to cultural aspect and higher risk angioedema.  Plan on recheck next visit.      Obesity (BMI 35.0-39.9 without comorbidity)    BMI 38.67 with elevation in BP.  She has tried for 6 months to lose weight via exercise and diet regimen.  At this time will send in  Contrave, unsure insurance will cover -- she reports she may pay out of pocket for it (has Wellbutrin and she has used this on own before for weight management).  Could also consider Wegovy.  Recommended eating smaller high protein, low fat meals more frequently and exercising 30 mins a day 5 times a week with a goal of 10-15lb weight loss in the next 3 months. Patient voiced their understanding and motivation to adhere to these recommendations.       Relevant Medications   Naltrexone-buPROPion HCl ER (CONTRAVE) 8-90 MG TB12     Follow up plan: Return in about 6 weeks (around 02/14/2022) for HTN and ANXIETY with Clydie Braun.

## 2022-01-03 NOTE — Assessment & Plan Note (Signed)
BMI 38.67 with elevation in BP.  She has tried for 6 months to lose weight via exercise and diet regimen.  At this time will send in Contrave, unsure insurance will cover -- she reports she may pay out of pocket for it (has Wellbutrin and she has used this on own before for weight management).  Could also consider Wegovy.  Recommended eating smaller high protein, low fat meals more frequently and exercising 30 mins a day 5 times a week with a goal of 10-15lb weight loss in the next 3 months. Patient voiced their understanding and motivation to adhere to these recommendations.

## 2022-01-03 NOTE — Assessment & Plan Note (Signed)
Chronic, ongoing although suspect more driven by anxiety.  Denies SI/HI.  Start Buspar 5 MG BID + starting Naltrexone for weight loss.  Monitor closely.  Recommend mediation and relaxation exercises daily.  Return in 6 weeks for follow-up with PCP.

## 2022-01-03 NOTE — Telephone Encounter (Signed)
Pt stated to agent she could not go to work today as she may have a heart attack or stroke. States she was seen at [facility] and was told to see PCP today.Message from Clydie Braun read to pt regarding appt.tomorrow to discuss blood pressure. Pt states she will go to ED to find someone who will help me "And I'll find another doctor."   Attempted to speak further with pt, pt hung up.

## 2022-01-06 ENCOUNTER — Emergency Department (HOSPITAL_COMMUNITY)
Admission: EM | Admit: 2022-01-06 | Discharge: 2022-01-06 | Disposition: A | Discharge status: Home / Self Care | Payer: BC Managed Care – PPO | Attending: Emergency Medicine | Admitting: Emergency Medicine

## 2022-01-06 ENCOUNTER — Encounter (HOSPITAL_COMMUNITY): Payer: Self-pay

## 2022-01-06 DIAGNOSIS — S20461A Insect bite (nonvenomous) of right back wall of thorax, initial encounter: Secondary | ICD-10-CM | POA: Insufficient documentation

## 2022-01-06 DIAGNOSIS — Z91040 Latex allergy status: Secondary | ICD-10-CM | POA: Insufficient documentation

## 2022-01-06 DIAGNOSIS — W57XXXA Bitten or stung by nonvenomous insect and other nonvenomous arthropods, initial encounter: Secondary | ICD-10-CM | POA: Diagnosis not present

## 2022-01-06 MED ORDER — DOXYCYCLINE HYCLATE 100 MG PO CAPS: 100.0000 mg | ORAL_CAPSULE | Freq: Two times a day (BID) | ORAL | 0 refills | Status: DC

## 2022-01-06 NOTE — ED Triage Notes (Signed)
Pt arrived via POV, c/o irritation at tick bite site. States on her back, noticed last Wednesday.

## 2022-01-06 NOTE — Discharge Instructions (Signed)
We are treating you with an antibiotic to treat for complications of skin infection or possible tickborne disease.  For pain take ibuprofen.  Use a warm compress on the sore area 3-4 times a day.  For itching use Claritin daily.  See your doctor as needed for problems.

## 2022-01-06 NOTE — ED Provider Notes (Signed)
[facility] DEPT Provider Note   CSN: [id number] Arrival date & time: 01/06/22  8144     History  Chief Complaint  Patient presents with   Insect Bite    Lynn Vargas is a 41 y.o. female.  HPI Patient here for evaluation of possible tick bite reaction.  She states her daughter pulled the tick off using a tissue, from her back, several days ago.  Since that time she has noticed some pain swelling and drainage from the area.  She denies other systemic symptoms including fever, rash, weakness or dizziness.  No prior similar problems.  No drug allergies    Home Medications Prior to Admission medications   Medication Sig Start Date End Date Taking? Authorizing Provider  doxycycline (VIBRAMYCIN) 100 MG capsule Take 1 capsule (100 mg total) by mouth 2 (two) times daily. One po bid x 7 days 01/06/22  Yes Daleen Bo, MD  Biotin 5000 MCG CAPS Take 1 capsule by mouth daily.    [provider]  busPIRone (BUSPAR) 5 MG tablet Take 1 tablet (5 mg total) by mouth 2 (two) times daily. 01/03/22   Marnee Guarneri T, NP  Multiple Vitamin (MULTIVITAMIN) tablet Take 1 tablet by mouth daily.    [provider]  Multiple Vitamins-Minerals (HAIR SKIN AND NAILS FORMULA PO) Take 1 tablet by mouth daily.    [provider]  Naltrexone-buPROPion HCl ER (CONTRAVE) 8-90 MG TB12 Start 1 tablet every morning for 7 days, then 1 tablet twice daily for 7 days, then 2 tablets every morning and one every evening 01/03/22   Cannady, Jolene T, NP  VITAMIN D PO Take 5,000 Units by mouth daily.    [provider]      Allergies    Latex    Review of Systems   Review of Systems  Physical Exam Updated Vital Signs BP (!) 150/108 (BP Location: Right Arm)   Pulse 91   Temp 99.6 F (37.6 C) (Oral)   Resp 18   SpO2 97%  Physical Exam Vitals and nursing note reviewed.  Constitutional:      General: She is not in acute distress.    Appearance:  She is well-developed. She is not ill-appearing.  HENT:     Head: Normocephalic and atraumatic.     Right Ear: External ear normal.     Left Ear: External ear normal.  Eyes:     Conjunctiva/sclera: Conjunctivae normal.     Pupils: Pupils are equal, round, and reactive to light.  Neck:     Trachea: Phonation normal.  Cardiovascular:     Rate and Rhythm: Normal rate.  Pulmonary:     Effort: Pulmonary effort is normal.  Abdominal:     General: There is no distension.  Musculoskeletal:        General: Normal range of motion.     Cervical back: Normal range of motion and neck supple.  Skin:    General: Skin is warm and dry.     Comments: Right upper back, near the tip of the scapula, with red raised area approximately 0.5 x 1.5 cm, and central puncture consistent with insect bite.  No drainage, bleeding or fluctuance.  This appears to be a localized inflammatory process.  No generalized rash.  Neurological:     Mental Status: She is alert and oriented to person, place, and time.     Cranial Nerves: No cranial nerve deficit.     Sensory: No sensory deficit.  Motor: No abnormal muscle tone.     Coordination: Coordination normal.  Psychiatric:        Mood and Affect: Mood normal.        Behavior: Behavior normal.        Thought Content: Thought content normal.        Judgment: Judgment normal.     ED Results / Procedures / Treatments   Labs (all labs ordered are listed, but only abnormal results are displayed) Labs Reviewed - No data to display  EKG None  Radiology No results found.  Procedures Procedures    Medications Ordered in ED Medications - No data to display  ED Course/ Medical Decision Making/ A&P                           Medical Decision Making Patient presenting for evaluation of irritation in a spot where tick was removed from.  No generalized symptoms.  Problems Addressed: Tick bite of right back wall of thorax, initial encounter: acute illness or  injury    Details: Appears to be local inflammatory process, not acutely infected.  Amount and/or Complexity of Data Reviewed Independent Historian:     Details: She is a cogent historian  Risk Prescription drug management. Decision regarding hospitalization. Risk Details: Local reaction after tick bite.  Possible early skin infection versus allergic response.  Patient covered with doxycycline.  Doubt tickborne illness at this time.  Does not need further ED evaluation or hospitalization, now.  Instructed on use of Claritin for itching.           Final Clinical Impression(s) / ED Diagnoses Final diagnoses:  Tick bite of right back wall of thorax, initial encounter    Rx / DC Orders ED Discharge Orders          Ordered    doxycycline (VIBRAMYCIN) 100 MG capsule  2 times daily        01/06/22 1022              Daleen Bo, MD 01/06/22 1034

## 2022-01-30 NOTE — Progress Notes (Unsigned)
There were no vitals taken for this visit.   Subjective:    Patient ID: Lynn Vargas, female    DOB: 08/27/1980, 41 y.o.   MRN: [id number]  HPI: Lynn Vargas is a 41 y.o. female  No chief complaint on file.   Relevant past medical, surgical, family and social history reviewed and updated as indicated. Interim medical history since our last visit reviewed. Allergies and medications reviewed and updated.  Review of Systems  Per HPI unless specifically indicated above     Objective:    There were no vitals taken for this visit.  Wt Readings from Last 3 Encounters:  01/03/22 221 lb 12.8 oz (100.6 kg)  01/02/22 222 lb 3.2 oz (100.8 kg)  11/29/21 215 lb 6.4 oz (97.7 kg)    Physical Exam  Results for orders placed or performed in visit on 09/27/21  CBC with Differential/Platelet  Result Value Ref Range   WBC 6.3 3.4 - 10.8 x10E3/uL   RBC 4.42 3.77 - 5.28 x10E6/uL   Hemoglobin 12.8 11.1 - 15.9 g/dL   Hematocrit 39.2 34.0 - 46.6 %   MCV 89 79 - 97 fL   MCH 29.0 26.6 - 33.0 pg   MCHC 32.7 31.5 - 35.7 g/dL   RDW 13.2 11.7 - 15.4 %   Platelets 262 150 - 450 x10E3/uL   Neutrophils 57 Not Estab. %   Lymphs 32 Not Estab. %   Monocytes 7 Not Estab. %   Eos 3 Not Estab. %   Basos 1 Not Estab. %   Neutrophils Absolute 3.5 1.4 - 7.0 x10E3/uL   Lymphocytes Absolute 2.0 0.7 - 3.1 x10E3/uL   Monocytes Absolute 0.5 0.1 - 0.9 x10E3/uL   EOS (ABSOLUTE) 0.2 0.0 - 0.4 x10E3/uL   Basophils Absolute 0.1 0.0 - 0.2 x10E3/uL   Immature Granulocytes 0 Not Estab. %   Immature Grans (Abs) 0.0 0.0 - 0.1 x10E3/uL  Comprehensive metabolic panel  Result Value Ref Range   Glucose 88 70 - 99 mg/dL   BUN 12 6 - 24 mg/dL   Creatinine, Ser 0.98 0.57 - 1.00 mg/dL   eGFR 74 >59 mL/min/1.73   BUN/Creatinine Ratio 12 9 - 23   Sodium 140 134 - 144 mmol/L   Potassium 4.4 3.5 - 5.2 mmol/L   Chloride 108 (H) 96 - 106 mmol/L   CO2 23 20 - 29 mmol/L   Calcium 9.6 8.7 - 10.2 mg/dL   Total Protein  6.4 6.0 - 8.5 g/dL   Albumin 4.4 3.8 - 4.8 g/dL   Globulin, Total 2.0 1.5 - 4.5 g/dL   Albumin/Globulin Ratio 2.2 1.2 - 2.2   Bilirubin Total 0.2 0.0 - 1.2 mg/dL   Alkaline Phosphatase 48 44 - 121 IU/L   AST 18 0 - 40 IU/L   ALT 22 0 - 32 IU/L  TSH  Result Value Ref Range   TSH 1.630 0.450 - 4.500 uIU/mL  VITAMIN D 25 Hydroxy (Vit-D Deficiency, Fractures)  Result Value Ref Range   Vit D, 25-Hydroxy 30.9 30.0 - 100.0 ng/mL  Vitamin B12  Result Value Ref Range   Vitamin B-12 842 232 - 1,245 pg/mL  Lyme Disease Serology w/Reflex  Result Value Ref Range   Lyme Total Antibody EIA Negative Negative  QuantiFERON-TB Gold Plus  Result Value Ref Range   QuantiFERON Incubation Incubation performed.    QuantiFERON Criteria Comment    QuantiFERON TB1 Ag Value 0.04 IU/mL   QuantiFERON TB2 Ag Value 0.04 IU/mL  QuantiFERON Nil Value 0.03 IU/mL   QuantiFERON Mitogen Value >10.00 IU/mL   QuantiFERON-TB Gold Plus Negative Negative      Assessment & Plan:   Problem List Items Addressed This Visit   None    Follow up plan: No follow-ups on file.

## 2022-01-31 ENCOUNTER — Ambulatory Visit: Payer: BC Managed Care – PPO | Admitting: Nurse Practitioner

## 2022-01-31 ENCOUNTER — Encounter: Payer: Self-pay | Admitting: Nurse Practitioner

## 2022-01-31 VITALS — BP 146/100 | HR 76 | Temp 99.1°F | Wt 224.5 lb

## 2022-01-31 DIAGNOSIS — Z0289 Encounter for other administrative examinations: Secondary | ICD-10-CM | POA: Diagnosis not present

## 2022-01-31 DIAGNOSIS — R0789 Other chest pain: Secondary | ICD-10-CM

## 2022-02-12 ENCOUNTER — Ambulatory Visit (HOSPITAL_BASED_OUTPATIENT_CLINIC_OR_DEPARTMENT_OTHER): Payer: BC Managed Care – PPO | Attending: Primary Care | Admitting: Pulmonary Disease

## 2022-02-12 DIAGNOSIS — R0683 Snoring: Secondary | ICD-10-CM | POA: Insufficient documentation

## 2022-02-12 DIAGNOSIS — Z9189 Other specified personal risk factors, not elsewhere classified: Secondary | ICD-10-CM | POA: Diagnosis not present

## 2022-02-13 NOTE — Progress Notes (Unsigned)
LMP  (LMP Unknown)    Subjective:    Patient ID: Lynn Vargas, female    DOB: [birth year], 41 y.o.   MRN: [id number]  HPI: MARYJAYNE Vargas is a 41 y.o. female  No chief complaint on file.  Patient presents to clinic today with complaints of chest heaviness when she was taking contrave and doxycycline.  Patient states she stopped taking the Contrave.  She went to urgent care for the heaviness in her chest and was given a shot for her headache.  Since then she has been having chest pain.  She now needs FMLA because she doesn't have any leave.    She did restart the contrave and symptoms have not worsened.  She did take the Buspar one night and it made her chest and heart hurt some.  She was having the chest heaviness before she was taking the contrave.    Relevant past medical, surgical, family and social history reviewed and updated as indicated. Interim medical history since our last visit reviewed. Allergies and medications reviewed and updated.  Review of Systems  Constitutional:  Positive for unexpected weight change.  Respiratory:  Positive for chest tightness.   Psychiatric/Behavioral:  The patient is nervous/anxious.     Per HPI unless specifically indicated above     Objective:    LMP  (LMP Unknown)   Wt Readings from Last 3 Encounters:  02/12/22 224 lb (101.6 kg)  01/31/22 224 lb 8 oz (101.8 kg)  01/03/22 221 lb 12.8 oz (100.6 kg)    Physical Exam Vitals and nursing note reviewed.  Constitutional:      General: She is not in acute distress.    Appearance: Normal appearance. She is obese. She is not ill-appearing, toxic-appearing or diaphoretic.  HENT:     Head: Normocephalic.     Right Ear: External ear normal.     Left Ear: External ear normal.     Nose: Nose normal.     Mouth/Throat:     Mouth: Mucous membranes are moist.     Pharynx: Oropharynx is clear.  Eyes:     General:        Right eye: No discharge.        Left eye: No discharge.      Extraocular Movements: Extraocular movements intact.     Conjunctiva/sclera: Conjunctivae normal.     Pupils: Pupils are equal, round, and reactive to light.  Cardiovascular:     Rate and Rhythm: Normal rate and regular rhythm.     Heart sounds: No murmur heard. Pulmonary:     Effort: Pulmonary effort is normal. No respiratory distress.     Breath sounds: Normal breath sounds. No wheezing or rales.  Musculoskeletal:     Cervical back: Normal range of motion and neck supple.  Skin:    General: Skin is warm and dry.     Capillary Refill: Capillary refill takes less than 2 seconds.  Neurological:     General: No focal deficit present.     Mental Status: She is alert and oriented to person, place, and time. Mental status is at baseline.  Psychiatric:        Mood and Affect: Mood normal.        Behavior: Behavior normal.        Thought Content: Thought content normal.        Judgment: Judgment normal.     Results for orders placed or performed in visit on 09/27/21  CBC with  Differential/Platelet  Result Value Ref Range   WBC 6.3 3.4 - 10.8 x10E3/uL   RBC 4.42 3.77 - 5.28 x10E6/uL   Hemoglobin 12.8 11.1 - 15.9 g/dL   Hematocrit 39.2 34.0 - 46.6 %   MCV 89 79 - 97 fL   MCH 29.0 26.6 - 33.0 pg   MCHC 32.7 31.5 - 35.7 g/dL   RDW 13.2 11.7 - 15.4 %   Platelets 262 150 - 450 x10E3/uL   Neutrophils 57 Not Estab. %   Lymphs 32 Not Estab. %   Monocytes 7 Not Estab. %   Eos 3 Not Estab. %   Basos 1 Not Estab. %   Neutrophils Absolute 3.5 1.4 - 7.0 x10E3/uL   Lymphocytes Absolute 2.0 0.7 - 3.1 x10E3/uL   Monocytes Absolute 0.5 0.1 - 0.9 x10E3/uL   EOS (ABSOLUTE) 0.2 0.0 - 0.4 x10E3/uL   Basophils Absolute 0.1 0.0 - 0.2 x10E3/uL   Immature Granulocytes 0 Not Estab. %   Immature Grans (Abs) 0.0 0.0 - 0.1 x10E3/uL  Comprehensive metabolic panel  Result Value Ref Range   Glucose 88 70 - 99 mg/dL   BUN 12 6 - 24 mg/dL   Creatinine, Ser 0.98 0.57 - 1.00 mg/dL   eGFR 74 >59 mL/min/1.73    BUN/Creatinine Ratio 12 9 - 23   Sodium 140 134 - 144 mmol/L   Potassium 4.4 3.5 - 5.2 mmol/L   Chloride 108 (H) 96 - 106 mmol/L   CO2 23 20 - 29 mmol/L   Calcium 9.6 8.7 - 10.2 mg/dL   Total Protein 6.4 6.0 - 8.5 g/dL   Albumin 4.4 3.8 - 4.8 g/dL   Globulin, Total 2.0 1.5 - 4.5 g/dL   Albumin/Globulin Ratio 2.2 1.2 - 2.2   Bilirubin Total 0.2 0.0 - 1.2 mg/dL   Alkaline Phosphatase 48 44 - 121 IU/L   AST 18 0 - 40 IU/L   ALT 22 0 - 32 IU/L  TSH  Result Value Ref Range   TSH 1.630 0.450 - 4.500 uIU/mL  VITAMIN D 25 Hydroxy (Vit-D Deficiency, Fractures)  Result Value Ref Range   Vit D, 25-Hydroxy 30.9 30.0 - 100.0 ng/mL  Vitamin B12  Result Value Ref Range   Vitamin B-12 842 232 - 1,245 pg/mL  Lyme Disease Serology w/Reflex  Result Value Ref Range   Lyme Total Antibody EIA Negative Negative  QuantiFERON-TB Gold Plus  Result Value Ref Range   QuantiFERON Incubation Incubation performed.    QuantiFERON Criteria Comment    QuantiFERON TB1 Ag Value 0.04 IU/mL   QuantiFERON TB2 Ag Value 0.04 IU/mL   QuantiFERON Nil Value 0.03 IU/mL   QuantiFERON Mitogen Value >10.00 IU/mL   QuantiFERON-TB Gold Plus Negative Negative      Assessment & Plan:   Problem List Items Addressed This Visit   None    Follow up plan: No follow-ups on file.

## 2022-02-14 ENCOUNTER — Encounter: Payer: Self-pay | Admitting: Nurse Practitioner

## 2022-02-14 ENCOUNTER — Ambulatory Visit: Payer: BC Managed Care – PPO | Admitting: Nurse Practitioner

## 2022-02-14 VITALS — BP 124/89 | HR 83 | Temp 98.8°F | Wt 222.4 lb

## 2022-02-14 DIAGNOSIS — F419 Anxiety disorder, unspecified: Secondary | ICD-10-CM | POA: Diagnosis not present

## 2022-02-14 DIAGNOSIS — Z0289 Encounter for other administrative examinations: Secondary | ICD-10-CM

## 2022-02-14 DIAGNOSIS — R0789 Other chest pain: Secondary | ICD-10-CM

## 2022-02-14 NOTE — Assessment & Plan Note (Signed)
Chronic.  Symptoms seems to be related to stress and anxiety.  Patient has been less stressed and symptoms have improved. Keep appt for Cardiology for further evaluation to rule out cardiac cause.

## 2022-02-15 DIAGNOSIS — R0683 Snoring: Secondary | ICD-10-CM | POA: Diagnosis not present

## 2022-02-15 NOTE — Procedures (Signed)
    Patient Name: Lynn Vargas, Mehlman Date: 02/12/2022 Gender: Female D.O.B: 19-Jul-1980 Age (years): 23 Referring Provider: Geraldo Pitter NP Height (inches): 60 Interpreting Physician: Chesley Mires MD, ABSM Weight (lbs): 224 RPSGT: Baxter Flattery BMI: 95 MRN: [id number] Neck Size: 15.00  CLINICAL INFORMATION Sleep Study Type: NPSG  Indication for sleep study: Obesity, Snoring, Witnesses Apnea / Gasping During Sleep  Epworth Sleepiness Score: 20  SLEEP STUDY TECHNIQUE As per the AASM Manual for the Scoring of Sleep and Associated Events v2.3 (April 2016) with a hypopnea requiring 4% desaturations.  The channels recorded and monitored were frontal, central and occipital EEG, electrooculogram (EOG), submentalis EMG (chin), nasal and oral airflow, thoracic and abdominal wall motion, anterior tibialis EMG, snore microphone, electrocardiogram, and pulse oximetry.  MEDICATIONS Medications self-administered by patient taken the night of the study : N/A  SLEEP ARCHITECTURE The study was initiated at 10:19:15 PM and ended at 5:15:20 AM.  Sleep onset time was 22.0 minutes and the sleep efficiency was 90.7%%. The total sleep time was 377.5 minutes.  Stage REM latency was 62.5 minutes.  The patient spent 1.6%% of the night in stage N1 sleep, 69.9%% in stage N2 sleep, 0.0%% in stage N3 and 28.5% in REM.  Alpha intrusion was absent.  Supine sleep was 27.96%.  RESPIRATORY PARAMETERS The overall apnea/hypopnea index (AHI) was 3.5 per hour. There were 1 total apneas, including 1 obstructive, 0 central and 0 mixed apneas. There were 21 hypopneas and 0 RERAs.  The AHI during Stage REM sleep was 11.2 per hour.  AHI while supine was 5.7 per hour.  The mean oxygen saturation was 95.6%. The minimum SpO2 during sleep was 90.0%.  Moderate snoring was noted during this study.  CARDIAC DATA The 2 lead EKG demonstrated sinus rhythm. The mean heart rate was 70.8 beats per minute. Other  EKG findings include: None.  LEG MOVEMENT DATA The total PLMS were 0 with a resulting PLMS index of 0.0. Associated arousal with leg movement index was 0.0 .  IMPRESSIONS - Her overall AHI was 3.5 with an SpO2 low of 90%.  As such she does not meet diagnostic criteria for obstructive sleep apnea. - She did have a supine sleep AHI of 5.7. - The patient snored with moderate snoring volume.  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 - Avoid alcohol, sedatives and other CNS depressants that may worsen sleep apnea and disrupt normal sleep architecture. - Sleep hygiene should be reviewed to assess factors that may improve sleep quality. - Weight management and regular exercise should be initiated or continued if appropriate.  [Electronically signed] 02/15/2022 08:51 AM  Chesley Mires MD, ABSM Diplomate, American Board of Sleep Medicine NPI: [id number]  Guadalupe Guerra PH: [phone]   FX: [phone] [place]

## 2022-02-21 ENCOUNTER — Telehealth (INDEPENDENT_AMBULATORY_CARE_PROVIDER_SITE_OTHER): Payer: BC Managed Care – PPO | Admitting: Primary Care

## 2022-02-21 DIAGNOSIS — R0683 Snoring: Secondary | ICD-10-CM

## 2022-02-21 NOTE — Progress Notes (Signed)
Virtual Visit via Video Note  I connected with Lynn Vargas on 02/21/22 at 12:00 PM EDT by a video enabled telemedicine application and verified that I am speaking with the correct person using two identifiers.  Location: Patient: Home Provider: Office   I discussed the limitations of evaluation and management by telemedicine and the availability of in person appointments. The patient expressed understanding and agreed to proceed.  History of Present Illness: 41 year old female, marked.  Past medical history significant for chronic fatigue, depression, vitamin D deficiency, anemia, obesity.  Previous [facility] pulmonary encounter: 01/02/2022 Patient presents today for sleep consult. She has symptoms of loud snoring, restless sleep and daytime sleepiness. She is constantly tired. Never feels she gets enough sleep. Symptoms have been worse last month. Typical bedtime is between 7 and 8 PM.  Takes her on average 20 to 30 minutes to fall asleep.  She will wake up 2-3 times a night.  She starts her day at 6:45 in the morning.  She is a side or stomach sleeper.  She has gained 20 lbs over the last year. She is on wellbutrin to help with weight loss. She drives for [employer].  No previous sleep studies.  She does not wear CPAP or oxygen.  Epworth score 19/24.  Blood pressure is elevated today. She called her PCP and they advised she go to ED. She denies chest pain, headache, dizziness/lightheadedness. She does experience blurred vision after she takes a nap.   Sleep questionnaire Symptoms-  Snore loudly, restless sleep, wakes up in the middle of the night, daytime sleepiness Prior sleep study- None Bedtime- 7-8pm Time to fall asleep- 20-30 min  Nocturnal awakenings- 2-3 times Out of bed/start of day- 6:45am Weight changes- up 20 lbs  Do you operate heavy machinery- Drives for [employer] Do you currently wear CPAP- No Do you current wear oxygen- No  Epworth- 19   02/21/2022 Patient contacted today for virtual  video visit to review sleep study results.  Patient had NPSG on 02/12/2022. She did not meet diagnostic criteria for obstructive sleep apnea.  Overall AHI was 3.5 with SPO2 lowest 90% (supine AHI 5.7).  Patient had moderate snoring volume. We reviewed treatment recommendations including positional therapy or oral appliance. She mainly sleeps on her stomach, unable to sleep on her side. She will look into getting a wedge pillow. She is interested in oral appliance when her braces get removed.She is working on weight loss and has noticed improvement in sleep quality.   Observations/Objective:  - Unable to connect to video portion visit, audio was working   Assessment and Plan:  Snoring: - Patient has symptoms of loud snoring and restless sleep. NPSG 02/12/22 >> Total AHI 3.5/hr (supine AHI 5.7/hr). She did not meet diagnostic criteria for obstructive sleep apnea. She does not need CPAP treatment or referral to ENT for surgical options. Recommend avoiding sleeping directly on her back and continue to encourage weight loss efforts. Advised she get a wedge pillow. She is interested in oral appliance for snoring after getting her braces removed. We will see her back in 6-8 month and can place referral then.   Follow Up Instructions:  6-8 months follow-up / then as needed    I discussed the assessment and treatment plan with the patient. The patient was provided an opportunity to ask questions and all were answered. The patient agreed with the plan and demonstrated an understanding of the instructions.   The patient was advised to call back or seek an  in-person evaluation if the symptoms worsen or if the condition fails to improve as anticipated.  I provided 22 minutes of non-face-to-face time during this encounter.   Martyn Ehrich, NP

## 2022-03-05 NOTE — Progress Notes (Signed)
Reviewed and agree with assessment/plan.   Analysia Dungee, MD [facility] 03/05/2022, 4:15 PM Pager:  [phone]  

## 2022-03-12 ENCOUNTER — Other Ambulatory Visit: Payer: Self-pay | Admitting: Unknown Physician Specialty

## 2022-03-14 NOTE — Telephone Encounter (Signed)
Requested medication (s) are due for refill today - no  Requested medication (s) are on the active medication list -no  Future visit scheduled -yes  Last refill: 09/27/21  Notes to clinic: Rx no longer on present medication list in this form- listed as discontinued by office 01/03/22  Requested Prescriptions  Pending Prescriptions Disp Refills   buPROPion (WELLBUTRIN XL) 300 MG 24 hr tablet [Pharmacy Med Name: BUPROPION HCL XL 300 MG TABLET] 90 tablet 1    Sig: TAKE 1 [place]     Psychiatry: Antidepressants - bupropion Passed - 03/12/2022  9:19 AM      Passed - Cr in normal range and within 360 days    Creatinine  Date Value Ref Range Status  08/04/2012 0.99 0.60 - 1.30 mg/dL Final   Creatinine, Ser  Date Value Ref Range Status  09/27/2021 0.98 0.57 - 1.00 mg/dL Final         Passed - AST in normal range and within 360 days    AST  Date Value Ref Range Status  09/27/2021 18 0 - 40 IU/L Final   SGOT(AST)  Date Value Ref Range Status  08/04/2012 18 15 - 37 Unit/L Final         Passed - ALT in normal range and within 360 days    ALT  Date Value Ref Range Status  09/27/2021 22 0 - 32 IU/L Final   SGPT (ALT)  Date Value Ref Range Status  08/04/2012 24 12 - 78 U/L Final         Passed - Completed PHQ-2 or PHQ-9 in the last 360 days      Passed - Last BP in normal range    BP Readings from Last 1 Encounters:  02/14/22 124/89         Passed - Valid encounter within last 6 months    Recent Outpatient Visits           4 weeks ago [place], Karen, NP   1 month ago Chest heaviness   [facility] Jon Billings, NP   2 months ago Mild episode of recurrent major depressive disorder ([place])   [place], Jolene T, NP   3 months ago Chronic fatigue   [facility] Mecum, Erin E, PA-C   5 months ago Obesity (BMI 35.0-39.9 without comorbidity)   [facility]  Kathrine Haddock, NP       Future Appointments             In 2 weeks Gollan, Kathlene November, MD [place]. [place]   In 1 month Jon Billings, NP [facility], [facility]               Requested Prescriptions  Pending Prescriptions Disp Refills   buPROPion (WELLBUTRIN XL) 300 MG 24 hr tablet [Pharmacy Med Name: BUPROPION HCL XL 300 MG TABLET] 90 tablet 1    Sig: TAKE 1 TABLET BY MOUTH EVERY DAY     Psychiatry: Antidepressants - bupropion Passed - 03/12/2022  9:19 AM      Passed - Cr in normal range and within 360 days    Creatinine  Date Value Ref Range Status  08/04/2012 0.99 0.60 - 1.30 mg/dL Final   Creatinine, Ser  Date Value Ref Range Status  09/27/2021 0.98 0.57 - 1.00 mg/dL Final  Passed - AST in normal range and within 360 days    AST  Date Value Ref Range Status  09/27/2021 18 0 - 40 IU/L Final   SGOT(AST)  Date Value Ref Range Status  08/04/2012 18 15 - 37 Unit/L Final         Passed - ALT in normal range and within 360 days    ALT  Date Value Ref Range Status  09/27/2021 22 0 - 32 IU/L Final   SGPT (ALT)  Date Value Ref Range Status  08/04/2012 24 12 - 78 U/L Final         Passed - Completed PHQ-2 or PHQ-9 in the last 360 days      Passed - Last BP in normal range    BP Readings from Last 1 Encounters:  02/14/22 124/89         Passed - Valid encounter within last 6 months    Recent Outpatient Visits           4 weeks ago [place], NP   1 month ago Chest heaviness   [place], [name], NP   2 months ago Mild episode of recurrent major depressive disorder ([place])   [place], Clarkson T, NP   3 months ago Chronic fatigue   [facility] Mecum, Erin E, PA-C   5 months ago Obesity (BMI 35.0-39.9 without comorbidity)   [facility] Kathrine Haddock, NP       Future  Appointments             In 2 weeks Gollan, Kathlene November, MD [place]. [place]   In 1 month Jon Billings, NP [facility], [place]

## 2022-03-28 ENCOUNTER — Ambulatory Visit: Payer: BC Managed Care – PPO | Admitting: Cardiovascular Disease

## 2022-04-18 NOTE — Progress Notes (Unsigned)
There were no vitals taken for this visit.   Subjective:    Patient ID: Lynn Vargas, female    DOB: 10-Nov-1980, 41 y.o.   MRN: [id number]  HPI: Lynn Vargas is a 41 y.o. female  No chief complaint on file.  Patient presents to clinic today to follow up for her chest heaviness.  She states she is doing well.  She has been fasting more and drinking ginger tea.  She hasn't restarted the contrave.  States she is trying to clear out her body and restarted her green drinks.  Denies concerns at visit today.  She is sleeping better.      Relevant past medical, surgical, family and social history reviewed and updated as indicated. Interim medical history since our last visit reviewed. Allergies and medications reviewed and updated.  Review of Systems  Constitutional:  Positive for unexpected weight change.  Respiratory:  Positive for chest tightness.   Psychiatric/Behavioral:  The patient is nervous/anxious.     Per HPI unless specifically indicated above     Objective:    There were no vitals taken for this visit.  Wt Readings from Last 3 Encounters:  02/14/22 222 lb 6.4 oz (100.9 kg)  02/12/22 224 lb (101.6 kg)  01/31/22 224 lb 8 oz (101.8 kg)    Physical Exam Vitals and nursing note reviewed.  Constitutional:      General: She is not in acute distress.    Appearance: Normal appearance. She is obese. She is not ill-appearing, toxic-appearing or diaphoretic.  HENT:     Head: Normocephalic.     Right Ear: External ear normal.     Left Ear: External ear normal.     Nose: Nose normal.     Mouth/Throat:     Mouth: Mucous membranes are moist.     Pharynx: Oropharynx is clear.  Eyes:     General:        Right eye: No discharge.        Left eye: No discharge.     Extraocular Movements: Extraocular movements intact.     Conjunctiva/sclera: Conjunctivae normal.     Pupils: Pupils are equal, round, and reactive to light.  Cardiovascular:     Rate and Rhythm: Normal  rate and regular rhythm.     Heart sounds: No murmur heard. Pulmonary:     Effort: Pulmonary effort is normal. No respiratory distress.     Breath sounds: Normal breath sounds. No wheezing or rales.  Musculoskeletal:     Cervical back: Normal range of motion and neck supple.  Skin:    General: Skin is warm and dry.     Capillary Refill: Capillary refill takes less than 2 seconds.  Neurological:     General: No focal deficit present.     Mental Status: She is alert and oriented to person, place, and time. Mental status is at baseline.  Psychiatric:        Mood and Affect: Mood normal.        Behavior: Behavior normal.        Thought Content: Thought content normal.        Judgment: Judgment normal.    Results for orders placed or performed in visit on 09/27/21  CBC with Differential/Platelet  Result Value Ref Range   WBC 6.3 3.4 - 10.8 x10E3/uL   RBC 4.42 3.77 - 5.28 x10E6/uL   Hemoglobin 12.8 11.1 - 15.9 g/dL   Hematocrit 39.2 34.0 - 46.6 %  MCV 89 79 - 97 fL   MCH 29.0 26.6 - 33.0 pg   MCHC 32.7 31.5 - 35.7 g/dL   RDW 13.2 11.7 - 15.4 %   Platelets 262 150 - 450 x10E3/uL   Neutrophils 57 Not Estab. %   Lymphs 32 Not Estab. %   Monocytes 7 Not Estab. %   Eos 3 Not Estab. %   Basos 1 Not Estab. %   Neutrophils Absolute 3.5 1.4 - 7.0 x10E3/uL   Lymphocytes Absolute 2.0 0.7 - 3.1 x10E3/uL   Monocytes Absolute 0.5 0.1 - 0.9 x10E3/uL   EOS (ABSOLUTE) 0.2 0.0 - 0.4 x10E3/uL   Basophils Absolute 0.1 0.0 - 0.2 x10E3/uL   Immature Granulocytes 0 Not Estab. %   Immature Grans (Abs) 0.0 0.0 - 0.1 x10E3/uL  Comprehensive metabolic panel  Result Value Ref Range   Glucose 88 70 - 99 mg/dL   BUN 12 6 - 24 mg/dL   Creatinine, Ser 0.98 0.57 - 1.00 mg/dL   eGFR 74 >59 mL/min/1.73   BUN/Creatinine Ratio 12 9 - 23   Sodium 140 134 - 144 mmol/L   Potassium 4.4 3.5 - 5.2 mmol/L   Chloride 108 (H) 96 - 106 mmol/L   CO2 23 20 - 29 mmol/L   Calcium 9.6 8.7 - 10.2 mg/dL   Total Protein  6.4 6.0 - 8.5 g/dL   Albumin 4.4 3.8 - 4.8 g/dL   Globulin, Total 2.0 1.5 - 4.5 g/dL   Albumin/Globulin Ratio 2.2 1.2 - 2.2   Bilirubin Total 0.2 0.0 - 1.2 mg/dL   Alkaline Phosphatase 48 44 - 121 IU/L   AST 18 0 - 40 IU/L   ALT 22 0 - 32 IU/L  TSH  Result Value Ref Range   TSH 1.630 0.450 - 4.500 uIU/mL  VITAMIN D 25 Hydroxy (Vit-D Deficiency, Fractures)  Result Value Ref Range   Vit D, 25-Hydroxy 30.9 30.0 - 100.0 ng/mL  Vitamin B12  Result Value Ref Range   Vitamin B-12 842 232 - 1,245 pg/mL  Lyme Disease Serology w/Reflex  Result Value Ref Range   Lyme Total Antibody EIA Negative Negative  QuantiFERON-TB Gold Plus  Result Value Ref Range   QuantiFERON Incubation Incubation performed.    QuantiFERON Criteria Comment    QuantiFERON TB1 Ag Value 0.04 IU/mL   QuantiFERON TB2 Ag Value 0.04 IU/mL   QuantiFERON Nil Value 0.03 IU/mL   QuantiFERON Mitogen Value >10.00 IU/mL   QuantiFERON-TB Gold Plus Negative Negative      Assessment & Plan:   Problem List Items Addressed This Visit       Other   Depression   Anxiety - Primary     Follow up plan: No follow-ups on file.

## 2022-04-19 ENCOUNTER — Encounter: Payer: Self-pay | Admitting: Nurse Practitioner

## 2022-04-19 ENCOUNTER — Ambulatory Visit: Payer: BC Managed Care – PPO | Admitting: Nurse Practitioner

## 2022-04-19 VITALS — BP 130/84 | HR 68 | Temp 98.8°F | Wt 212.7 lb

## 2022-04-19 DIAGNOSIS — F419 Anxiety disorder, unspecified: Secondary | ICD-10-CM | POA: Diagnosis not present

## 2022-04-19 DIAGNOSIS — F330 Major depressive disorder, recurrent, mild: Secondary | ICD-10-CM

## 2022-04-19 DIAGNOSIS — R030 Elevated blood-pressure reading, without diagnosis of hypertension: Secondary | ICD-10-CM

## 2022-04-19 NOTE — Assessment & Plan Note (Signed)
Elevated on first check then improved.  Recommend working on diet and exercise over the next 3 months.  Follow up in 3 months for reevaluation.  Call sooner if concerns arise.

## 2022-04-19 NOTE — Assessment & Plan Note (Signed)
Chronic.  Controlled.  Continue with current medication regimen of Herbs and exercise.  Return to clinic in 3 months for reevaluation.  Call sooner if concerns arise.

## 2022-05-15 DIAGNOSIS — R0789 Other chest pain: Secondary | ICD-10-CM | POA: Insufficient documentation

## 2022-05-15 NOTE — Progress Notes (Unsigned)
Cardiology Office Note  Date:  05/16/2022   ID:  KARLYN GLASCO, DOB Jul 01, 1981, MRN [id number]  PCP:  Jon Billings, NP   Chief Complaint  Patient presents with   New Patient (Initial Visit)    Patient reports that she has had chest pressure and evaluated BP. Patient reports that she did have a sleep study done and the chest heaviness has subsided. Meds reviewed verbally withy patient.     HPI:  Ms. Emiyah Spraggins is a 41 year old woman with past medical history of Stress/anxiety/depression Hypertension Who presents by referral from [facility] for chest heaviness  On discussion today, she reports that she has significant stress in her life Over the summer out on FMLA Has been working for [employer] for 20 years Appreciated chest discomfort after getting in an argument with her 41 year old high functioning autistic son Reported having some shortness of breath when lying supine in bed Since then taking more time for herself, less time at [place] As a second job working at [employer] that her sleep is poor Has 3 children ages 41-18  Intentionally trying to lose weight through fasting, diet restriction No chest pain on exertion  CT ABD/pelvis No aorta atherosclerosis  No diabetes, non-smoker, reasonable cholesterol 190  EKG personally reviewed by myself on todays visit NSR rate no significant ST-T wave changes  PMH:   has a past medical history of Anemia, Anxiety, Cyst of perianal area, Depression, Fetal abnormality in antepartum pregnancy (2012), Gestational diabetes, HSV-2 (herpes simplex virus 2) infection, Hypertension, Shortness of breath, and UTI (lower urinary tract infection).  PSH:    Past Surgical History:  Procedure Laterality Date   CESAREAN SECTION     Pionidal cyst      Current Outpatient Medications  Medication Sig Dispense Refill   Biotin 5000 MCG CAPS Take 1 capsule by mouth daily.     VITAMIN D PO Take 5,000 Units by mouth daily.     No current  facility-administered medications for this visit.    Allergies:   Latex   Social History:  The patient  reports that she has never smoked. She has never used smokeless tobacco. She reports that she does not drink alcohol and does not use drugs.   Family History:   family history includes Asthma in her mother; Cancer in her daughter; Diabetes in her maternal aunt; Heart disease in her maternal grandmother.   Review of Systems: Review of Systems  Constitutional: Negative.   HENT: Negative.    Respiratory: Negative.    Cardiovascular:  Positive for chest pain.  Gastrointestinal: Negative.   Musculoskeletal: Negative.   Neurological: Negative.   Psychiatric/Behavioral: Negative.    All other systems reviewed and are negative.   PHYSICAL EXAM: VS:  BP 120/80 (BP Location: Right Arm, Patient Position: Sitting, Cuff Size: Normal)   Pulse 87   Ht 5' 3.5" (1.613 m)   Wt 212 lb (96.2 kg)   LMP 03/31/2022 (Exact Date)   SpO2 98%   BMI 36.97 kg/m  , BMI Body mass index is 36.97 kg/m. GEN: Well nourished, well developed, in no acute distress HEENT: normal Neck: no JVD, carotid bruits, or masses Cardiac: RRR; no murmurs, rubs, or gallops,no edema  Respiratory:  clear to auscultation bilaterally, normal work of breathing GI: soft, nontender, nondistended, + BS MS: no deformity or atrophy Skin: warm and dry, no rash Neuro:  Strength and sensation are intact Psych: euthymic mood, full affect  Recent Labs: 09/27/2021: ALT 22; BUN 12;  Creatinine, Ser 0.98; Hemoglobin 12.8; Platelets 262; Potassium 4.4; Sodium 140; TSH 1.630   Lipid Panel Lab Results  Component Value Date   CHOL 195 10/31/2019   HDL 67 10/31/2019   LDLCALC 117 (H) 10/31/2019   TRIG 58 10/31/2019     Wt Readings from Last 3 Encounters:  05/16/22 212 lb (96.2 kg)  04/19/22 212 lb 11.2 oz (96.5 kg)  02/14/22 222 lb 6.4 oz (100.9 kg)     ASSESSMENT AND PLAN:  Problem List Items Addressed This Visit     Chest  heaviness - Primary   Elevated blood pressure reading   Atypical chest pain In the setting of stress, 3 children, 2 jobs, insomnia, Single mother Less likely cardiac etiology No significant cardiac risk factors CT scan reviewed with no aortic atherosclerosis Non-smoker, no diabetes, reasonable cholesterol by history Discussed findings with her We will hold off on additional testing at this time as symptoms seem to be improving as stress is improving, out on FMLA from [employer] Discussed stress reduction techniques  Obesity We have encouraged continued exercise, careful diet management in an effort to lose weight.   Total encounter time more than 50 minutes  Greater than 50% was spent in counseling and coordination of care with the patient    Signed, Esmond Plants, M.D., Ph.D. [place], [place]

## 2022-05-16 ENCOUNTER — Encounter: Payer: Self-pay | Admitting: Cardiovascular Disease

## 2022-05-16 ENCOUNTER — Ambulatory Visit: Payer: BC Managed Care – PPO | Attending: Cardiovascular Disease | Admitting: Cardiovascular Disease

## 2022-05-16 VITALS — BP 120/80 | HR 87 | Ht 63.5 in | Wt 212.0 lb

## 2022-05-16 DIAGNOSIS — R030 Elevated blood-pressure reading, without diagnosis of hypertension: Secondary | ICD-10-CM | POA: Diagnosis not present

## 2022-05-16 DIAGNOSIS — R0789 Other chest pain: Secondary | ICD-10-CM | POA: Diagnosis not present

## 2022-05-16 NOTE — Patient Instructions (Signed)
Medication Instructions:  No changes  If you need a refill on your cardiac medications before your next appointment, please call your pharmacy.   Lab work: No new labs needed  Testing/Procedures: No new testing needed  Follow-Up: At [facility], you and your health needs are our priority.  As part of our continuing mission to provide you with exceptional heart care, we have created designated Provider Care Teams.  These Care Teams include your primary Cardiologist (physician) and Advanced Practice Providers (APPs -  Physician Assistants and Nurse Practitioners) who all work together to provide you with the care you need, when you need it.  You will need a follow up appointment as needed  Providers on your designated Care Team:   Christopher Berge, NP Ryan Dunn, PA-C Cadence Furth, PA-C  COVID-19 Vaccine Information can be found at: [email or web address] For questions related to vaccine distribution or appointments, please email [email or web address] or call [phone].    

## 2022-07-24 ENCOUNTER — Ambulatory Visit (INDEPENDENT_AMBULATORY_CARE_PROVIDER_SITE_OTHER): Payer: BC Managed Care – PPO | Admitting: Nurse Practitioner

## 2022-07-24 ENCOUNTER — Encounter: Payer: Self-pay | Admitting: Nurse Practitioner

## 2022-07-24 VITALS — BP 123/80 | HR 73 | Temp 99.1°F | Ht 62.5 in | Wt 205.2 lb

## 2022-07-24 DIAGNOSIS — E7800 Pure hypercholesterolemia, unspecified: Secondary | ICD-10-CM | POA: Diagnosis not present

## 2022-07-24 DIAGNOSIS — F419 Anxiety disorder, unspecified: Secondary | ICD-10-CM

## 2022-07-24 DIAGNOSIS — R5383 Other fatigue: Secondary | ICD-10-CM | POA: Diagnosis not present

## 2022-07-24 DIAGNOSIS — Z0000 Encounter for general adult medical examination without abnormal findings: Secondary | ICD-10-CM | POA: Diagnosis not present

## 2022-07-24 DIAGNOSIS — E559 Vitamin D deficiency, unspecified: Secondary | ICD-10-CM | POA: Diagnosis not present

## 2022-07-24 DIAGNOSIS — Z1159 Encounter for screening for other viral diseases: Secondary | ICD-10-CM

## 2022-07-24 DIAGNOSIS — Z23 Encounter for immunization: Secondary | ICD-10-CM

## 2022-07-24 DIAGNOSIS — F330 Major depressive disorder, recurrent, mild: Secondary | ICD-10-CM

## 2022-07-24 DIAGNOSIS — Z1231 Encounter for screening mammogram for malignant neoplasm of breast: Secondary | ICD-10-CM

## 2022-07-24 DIAGNOSIS — E669 Obesity, unspecified: Secondary | ICD-10-CM | POA: Diagnosis not present

## 2022-07-24 LAB — URINALYSIS, ROUTINE W REFLEX MICROSCOPIC
Bilirubin, UA: NEGATIVE
Glucose, UA: NEGATIVE
Ketones, UA: NEGATIVE
Leukocytes,UA: NEGATIVE
Nitrite, UA: NEGATIVE
Protein,UA: NEGATIVE
RBC, UA: NEGATIVE
Specific Gravity, UA: 1.015 (ref 1.005–1.030)
Urobilinogen, Ur: 0.2 mg/dL (ref 0.2–1.0)
pH, UA: 7 (ref 5.0–7.5)

## 2022-07-24 NOTE — Assessment & Plan Note (Signed)
Chronic. Doing well per patient.  Working on natural supplements at home.  Declines medication at visit today.  Follow up in 6 months. Call sooner if concerns arise.

## 2022-07-24 NOTE — Assessment & Plan Note (Signed)
Labs ordered at visit today.  Will make recommendations based on lab results.   

## 2022-07-24 NOTE — Assessment & Plan Note (Signed)
Recommended eating smaller high protein, low fat meals more frequently and exercising 30 mins a day 5 times a week with a goal of 10-15lb weight loss in the next 3 months.  

## 2022-07-24 NOTE — Progress Notes (Signed)
BP 123/80   Pulse 73   Temp 99.1 F (37.3 C) (Oral)   Ht 5' 2.5" (1.588 m)   Wt 205 lb 3.2 oz (93.1 kg)   LMP 07/13/2022 (Exact Date)   SpO2 97%   BMI 36.93 kg/m    Subjective:    Patient ID: Lynn Vargas, female    DOB: [birth year], 42 y.o.   MRN: [id number]  HPI: Lynn Vargas is a 42 y.o. female presenting on 07/24/2022 for comprehensive medical examination. Current medical complaints include:none  She currently lives with: Menopausal Symptoms: no  ELEVATED BLOOD PRESSURE Duration of elevated BP: chronic BP monitoring frequency: not checking BP range:  Previous BP meds: no Recent stressors: yes Family history of hypertension: yes Recurrent headaches: yes Visual changes: no Palpitations: no  Dyspnea: no Chest pain: no Lower extremity edema: no Dizzy/lightheaded: no Transient ischemic attacks:none  FATIGUE Duration:  months Severity: moderate  Onset: gradual Context when symptoms started:  unknown Symptoms improve with rest: no  Depressive symptoms: yes Stress/anxiety: yes Insomnia: yes  due to pain in her neck Snoring: yes Observed apnea by bed partner:  had a sleep study but no apnea observed Daytime hypersomnolence:yes Wakes feeling refreshed: no History of sleep study: yes Dysnea on exertion:  no Orthopnea/PND: no Chest pain: no Chronic cough: no Lower extremity edema: no Arthralgias:no Myalgias: no Weakness: no Rash: no  MOOD Patient states she isn't sure why her depression screen is high.  She does not want to do medication.  Working on natural remedies at home.  Denies SI.  Depression Screen done today and results listed below:     07/24/2022   10:14 AM 04/19/2022   11:11 AM 02/14/2022   11:08 AM 01/31/2022    8:39 AM 01/03/2022    3:36 PM  Depression screen PHQ 2/9  Decreased Interest '3 1 2 1 '$ 0  Down, Depressed, Hopeless 0 0 '2 1 2  '$ PHQ - 2 Score '3 1 4 2 2  '$ Altered sleeping '3 1 3 1 2  '$ Tired, decreased energy '3 1 2 3 1  '$ Change in  appetite 0 0 0 1 2  Feeling bad or failure about yourself  0 0 0 0 0  Trouble concentrating 0 0 1 0 0  Moving slowly or fidgety/restless 2 2 0 1 2  Suicidal thoughts 0 0 0 0 0  PHQ-9 Score '11 5 10 8 9  '$ Difficult doing work/chores Very difficult Not difficult at all Somewhat difficult Somewhat difficult Not difficult at all    The patient does not have a history of falls. I did complete a risk assessment for falls. A plan of care for falls was documented.   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   Hypertension    after pregnancy   Shortness of breath    UTI (lower urinary tract infection)     Surgical History:  Past Surgical History:  Procedure Laterality Date   CESAREAN SECTION     Pionidal cyst      Medications:  Current Outpatient Medications on File Prior to Visit  Medication Sig  Biotin 5000 MCG CAPS Take 1 capsule by mouth daily.   LACTOFERRIN PO Take 300 mg by mouth daily.   VITAMIN D PO Take 5,000 Units by mouth daily.   No current facility-administered medications on file prior to visit.    Allergies:  Allergies  Allergen Reactions   Latex     rash    Social History:  Social History   Socioeconomic History   Marital status: Divorced    Spouse name: Not on file   Number of children: Not on file   Years of education: Not on file   Highest education level: Not on file  Occupational History   Not on file  Tobacco Use   Smoking status: Never   Smokeless tobacco: Never  Vaping Use   Vaping Use: Never used  Substance and Sexual Activity   Alcohol use: No   Drug use: No   Sexual activity: Not Currently  Other Topics Concern   Not  on file  Social History Narrative   Not on file   Social Determinants of Health   Financial Resource Strain: Not on file  Food Insecurity: Not on file  Transportation Needs: Not on file  Physical Activity: Not on file  Stress: Not on file  Social Connections: Not on file  Intimate Partner Violence: Not on file   Social History   Tobacco Use  Smoking Status Never  Smokeless Tobacco Never   Social History   Substance and Sexual Activity  Alcohol Use No    Family History:  Family History  Problem Relation Age of Onset   Asthma Mother    Cancer Daughter        placenta cancer   Diabetes Maternal Aunt    Heart disease Maternal Grandmother    Stroke Neg Hx     Past medical history, surgical history, medications, allergies, family history and social history reviewed with patient today and changes made to appropriate areas of the chart.   Review of Systems  Constitutional:  Positive for malaise/fatigue.  Eyes:  Negative for blurred vision and double vision.  Respiratory:  Negative for shortness of breath.   Cardiovascular:  Negative for chest pain, palpitations and leg swelling.  Neurological:  Negative for dizziness and headaches.  Psychiatric/Behavioral:  Positive for depression. The patient is nervous/anxious and has insomnia.    All other ROS negative except what is listed above and in the HPI.      Objective:    BP 123/80   Pulse 73   Temp 99.1 F (37.3 C) (Oral)   Ht 5' 2.5" (1.588 m)   Wt 205 lb 3.2 oz (93.1 kg)   LMP 07/13/2022 (Exact Date)   SpO2 97%   BMI 36.93 kg/m   Wt Readings from Last 3 Encounters:  07/24/22 205 lb 3.2 oz (93.1 kg)  05/16/22 212 lb (96.2 kg)  04/19/22 212 lb 11.2 oz (96.5 kg)    Physical Exam Vitals and nursing note reviewed.  Constitutional:      General: She is awake. She is not in acute distress.    Appearance: Normal appearance. She is well-developed. She is obese. She is not ill-appearing.  HENT:     Head:  Normocephalic and atraumatic.     Right Ear: Hearing, tympanic membrane, ear canal and external ear normal. No drainage.     Left Ear: Hearing, tympanic membrane, ear canal and external ear normal. No drainage.     Nose: Nose normal.     Right Sinus:  No maxillary sinus tenderness or frontal sinus tenderness.     Left Sinus: No maxillary sinus tenderness or frontal sinus tenderness.     Mouth/Throat:     Mouth: Mucous membranes are moist.     Pharynx: Oropharynx is clear. Uvula midline. No pharyngeal swelling, oropharyngeal exudate or posterior oropharyngeal erythema.  Eyes:     General: Lids are normal.        Right eye: No discharge.        Left eye: No discharge.     Extraocular Movements: Extraocular movements intact.     Conjunctiva/sclera: Conjunctivae normal.     Pupils: Pupils are equal, round, and reactive to light.     Visual Fields: Right eye visual fields normal and left eye visual fields normal.  Neck:     Thyroid: No thyromegaly.     Vascular: No carotid bruit.     Trachea: Trachea normal.  Cardiovascular:     Rate and Rhythm: Normal rate and regular rhythm.     Heart sounds: Normal heart sounds. No murmur heard.    No gallop.  Pulmonary:     Effort: Pulmonary effort is normal. No accessory muscle usage or respiratory distress.     Breath sounds: Normal breath sounds.  Chest:  Breasts:    Right: Normal.     Left: Normal.  Abdominal:     General: Bowel sounds are normal.     Palpations: Abdomen is soft. There is no hepatomegaly or splenomegaly.     Tenderness: There is no abdominal tenderness.  Musculoskeletal:        General: Normal range of motion.     Cervical back: Normal range of motion and neck supple.     Right lower leg: No edema.     Left lower leg: No edema.  Lymphadenopathy:     Head:     Right side of head: No submental, submandibular, tonsillar, preauricular or posterior auricular adenopathy.     Left side of head: No submental, submandibular,  tonsillar, preauricular or posterior auricular adenopathy.     Cervical: No cervical adenopathy.     Upper Body:     Right upper body: No supraclavicular, axillary or pectoral adenopathy.     Left upper body: No supraclavicular, axillary or pectoral adenopathy.  Skin:    General: Skin is warm and dry.     Capillary Refill: Capillary refill takes less than 2 seconds.     Findings: No rash.  Neurological:     Mental Status: She is alert and oriented to person, place, and time.     Gait: Gait is intact.  Psychiatric:        Attention and Perception: Attention normal.        Mood and Affect: Mood normal.        Speech: Speech normal.        Behavior: Behavior normal. Behavior is cooperative.        Thought Content: Thought content normal.        Judgment: Judgment normal.     Results for orders placed or performed in visit on 09/27/21  CBC with Differential/Platelet  Result Value Ref Range   WBC 6.3 3.4 - 10.8 x10E3/uL   RBC 4.42 3.77 - 5.28 x10E6/uL   Hemoglobin 12.8 11.1 - 15.9 g/dL   Hematocrit 39.2 34.0 - 46.6 %   MCV 89 79 - 97 fL   MCH 29.0 26.6 - 33.0 pg   MCHC 32.7 31.5 - 35.7 g/dL  RDW 13.2 11.7 - 15.4 %   Platelets 262 150 - 450 x10E3/uL   Neutrophils 57 Not Estab. %   Lymphs 32 Not Estab. %   Monocytes 7 Not Estab. %   Eos 3 Not Estab. %   Basos 1 Not Estab. %   Neutrophils Absolute 3.5 1.4 - 7.0 x10E3/uL   Lymphocytes Absolute 2.0 0.7 - 3.1 x10E3/uL   Monocytes Absolute 0.5 0.1 - 0.9 x10E3/uL   EOS (ABSOLUTE) 0.2 0.0 - 0.4 x10E3/uL   Basophils Absolute 0.1 0.0 - 0.2 x10E3/uL   Immature Granulocytes 0 Not Estab. %   Immature Grans (Abs) 0.0 0.0 - 0.1 x10E3/uL  Comprehensive metabolic panel  Result Value Ref Range   Glucose 88 70 - 99 mg/dL   BUN 12 6 - 24 mg/dL   Creatinine, Ser 0.98 0.57 - 1.00 mg/dL   eGFR 74 >59 mL/min/1.73   BUN/Creatinine Ratio 12 9 - 23   Sodium 140 134 - 144 mmol/L   Potassium 4.4 3.5 - 5.2 mmol/L   Chloride 108 (H) 96 - 106  mmol/L   CO2 23 20 - 29 mmol/L   Calcium 9.6 8.7 - 10.2 mg/dL   Total Protein 6.4 6.0 - 8.5 g/dL   Albumin 4.4 3.8 - 4.8 g/dL   Globulin, Total 2.0 1.5 - 4.5 g/dL   Albumin/Globulin Ratio 2.2 1.2 - 2.2   Bilirubin Total 0.2 0.0 - 1.2 mg/dL   Alkaline Phosphatase 48 44 - 121 IU/L   AST 18 0 - 40 IU/L   ALT 22 0 - 32 IU/L  TSH  Result Value Ref Range   TSH 1.630 0.450 - 4.500 uIU/mL  VITAMIN D 25 Hydroxy (Vit-D Deficiency, Fractures)  Result Value Ref Range   Vit D, 25-Hydroxy 30.9 30.0 - 100.0 ng/mL  Vitamin B12  Result Value Ref Range   Vitamin B-12 842 232 - 1,245 pg/mL  Lyme Disease Serology w/Reflex  Result Value Ref Range   Lyme Total Antibody EIA Negative Negative  QuantiFERON-TB Gold Plus  Result Value Ref Range   QuantiFERON Incubation Incubation performed.    QuantiFERON Criteria Comment    QuantiFERON TB1 Ag Value 0.04 IU/mL   QuantiFERON TB2 Ag Value 0.04 IU/mL   QuantiFERON Nil Value 0.03 IU/mL   QuantiFERON Mitogen Value >10.00 IU/mL   QuantiFERON-TB Gold Plus Negative Negative      Assessment & Plan:   Problem List Items Addressed This Visit       Other   Depression    Chronic. Doing well per patient.  Working on natural supplements at home.  Declines medication at visit today.  Follow up in 6 months. Call sooner if concerns arise.       Anxiety    Chronic. Doing well per patient.  Working on natural supplements at home.  Declines medication at visit today.  Follow up in 6 months. Call sooner if concerns arise.       Obesity (BMI 35.0-39.9 without comorbidity) - Primary    Recommended eating smaller high protein, low fat meals more frequently and exercising 30 mins a day 5 times a week with a goal of 10-15lb weight loss in the next 3 months.       Vitamin D deficiency    Labs ordered at visit today.  Will make recommendations based on lab results.        Relevant Orders   Vitamin D (25 hydroxy)   Elevated low density lipoprotein (LDL)  cholesterol level  Labs ordered at visit today.  Will make recommendations based on lab results.        Relevant Orders   Lipid panel   Other Visit Diagnoses     Annual physical exam       Health maintenance reviewed during visit today.  Labs ordered.  Vaccines up to date.  PAP up to date. Mammogram ordered.   Relevant Orders   CBC with Differential/Platelet   Comprehensive metabolic panel   TSH   Urinalysis, Routine w reflex microscopic   Hepatitis C Antibody   Other fatigue       Labs ordered at visit today. Will make recommendations based on lab results. Also recommend speaking with dentist about mouth guard due to teeth griding.   Relevant Orders   B12   Encounter for hepatitis C screening test for low risk patient       Relevant Orders   Hepatitis C Antibody   Need for Tdap vaccination       Relevant Orders   Tdap vaccine greater than or equal to 7yo IM (Completed)   Encounter for screening mammogram for malignant neoplasm of breast       Relevant Orders   MM 3D SCREEN BREAST BILATERAL        Follow up plan: Return in about 5 months (around 12/23/2022) for HTN, HLD, DM2 FU.   LABORATORY TESTING:  - Pap smear: up to date  IMMUNIZATIONS:   - Tdap: Tetanus vaccination status reviewed: last tetanus booster within 10 years. - Influenza: Refused - Pneumovax: Not applicable - Prevnar: Not applicable - COVID: Not applicable - HPV: Not applicable - Shingrix vaccine: Not applicable  SCREENING: -Mammogram: Ordered today  - Colonoscopy: Not applicable  - Bone Density: Not applicable  -Hearing Test: Not applicable  -Spirometry: Not applicable   PATIENT COUNSELING:   Advised to take 1 mg of folate supplement per day if capable of pregnancy.   Sexuality: Discussed sexually transmitted diseases, partner selection, use of condoms, avoidance of unintended pregnancy  and contraceptive alternatives.   Advised to avoid cigarette smoking.  I discussed with the patient  that most people either abstain from alcohol or drink within safe limits (<=14/week and <=4 drinks/occasion for males, <=7/weeks and <= 3 drinks/occasion for females) and that the risk for alcohol disorders and other health effects rises proportionally with the number of drinks per week and how often a drinker exceeds daily limits.  Discussed cessation/primary prevention of drug use and availability of treatment for abuse.   Diet: Encouraged to adjust caloric intake to maintain  or achieve ideal body weight, to reduce intake of dietary saturated fat and total fat, to limit sodium intake by avoiding high sodium foods and not adding table salt, and to maintain adequate dietary potassium and calcium preferably from fresh fruits, vegetables, and low-fat dairy products.    stressed the importance of regular exercise  Injury prevention: Discussed safety belts, safety helmets, smoke detector, smoking near bedding or upholstery.   Dental health: Discussed importance of regular tooth brushing, flossing, and dental visits.    NEXT PREVENTATIVE PHYSICAL DUE IN 1 YEAR. Return in about 5 months (around 12/23/2022) for HTN, HLD, DM2 FU.

## 2022-07-24 NOTE — Patient Instructions (Signed)
Please call to schedule your mammogram and/or bone density: [facility]  Address: [address], , [place] [address] Phone: [phone]  [facility] [place] [address] [place],  [place]  [address] Phone: [phone]   

## 2022-07-25 LAB — COMPREHENSIVE METABOLIC PANEL
ALT: 18 IU/L (ref 0–32)
AST: 19 IU/L (ref 0–40)
Albumin/Globulin Ratio: 1.8 (ref 1.2–2.2)
Albumin: 4.4 g/dL (ref 3.9–4.9)
Alkaline Phosphatase: 48 IU/L (ref 44–121)
BUN/Creatinine Ratio: 12 (ref 9–23)
BUN: 10 mg/dL (ref 6–24)
Bilirubin Total: 0.5 mg/dL (ref 0.0–1.2)
CO2: 24 mmol/L (ref 20–29)
Calcium: 9.8 mg/dL (ref 8.7–10.2)
Chloride: 102 mmol/L (ref 96–106)
Creatinine, Ser: 0.82 mg/dL (ref 0.57–1.00)
Globulin, Total: 2.5 g/dL (ref 1.5–4.5)
Glucose: 88 mg/dL (ref 70–99)
Potassium: 4.1 mmol/L (ref 3.5–5.2)
Sodium: 137 mmol/L (ref 134–144)
Total Protein: 6.9 g/dL (ref 6.0–8.5)
eGFR: 92 mL/min/{1.73_m2} (ref >59)

## 2022-07-25 LAB — CBC WITH DIFFERENTIAL/PLATELET
Basophils Absolute: 0 10*3/uL (ref 0.0–0.2)
Basos: 1 %
EOS (ABSOLUTE): 0.4 10*3/uL (ref 0.0–0.4)
Eos: 7 %
Hematocrit: 36.4 % (ref 34.0–46.6)
Hemoglobin: 12.2 g/dL (ref 11.1–15.9)
Immature Grans (Abs): 0 10*3/uL (ref 0.0–0.1)
Immature Granulocytes: 0 %
Lymphocytes Absolute: 1.8 10*3/uL (ref 0.7–3.1)
Lymphs: 33 %
MCH: 29.2 pg (ref 26.6–33.0)
MCHC: 33.5 g/dL (ref 31.5–35.7)
MCV: 87 fL (ref 79–97)
Monocytes Absolute: 0.5 10*3/uL (ref 0.1–0.9)
Monocytes: 9 %
Neutrophils Absolute: 2.7 10*3/uL (ref 1.4–7.0)
Neutrophils: 50 %
Platelets: 252 10*3/uL (ref 150–450)
RBC: 4.18 x10E6/uL (ref 3.77–5.28)
RDW: 13 % (ref 11.7–15.4)
WBC: 5.5 10*3/uL (ref 3.4–10.8)

## 2022-07-25 LAB — TSH: TSH: 1.85 u[IU]/mL (ref 0.450–4.500)

## 2022-07-25 LAB — LIPID PANEL
Chol/HDL Ratio: 3.1 ratio (ref 0.0–4.4)
Cholesterol, Total: 187 mg/dL (ref 100–199)
HDL: 60 mg/dL (ref >39)
LDL Chol Calc (NIH): 115 mg/dL — ABNORMAL HIGH (ref 0–99)
Triglycerides: 62 mg/dL (ref 0–149)
VLDL Cholesterol Cal: 12 mg/dL (ref 5–40)

## 2022-07-25 LAB — VITAMIN D 25 HYDROXY (VIT D DEFICIENCY, FRACTURES): Vit D, 25-Hydroxy: 44.1 ng/mL (ref 30.0–100.0)

## 2022-07-25 LAB — HEPATITIS C ANTIBODY: Hep C Virus Ab: NONREACTIVE

## 2022-07-25 LAB — VITAMIN B12: Vitamin B-12: 752 pg/mL (ref 232–1245)

## 2022-07-25 NOTE — Progress Notes (Signed)
HI Lynn Vargas. It was nice to see you yesterday.  Your lab work looks good.  Cholesterol is slightly elevated.  I recommend a low fat diet and exercise.  No medications at this time.  No reason to explain your fatigue.  I recommend speaking with the orthodontist about a mouth guard.  No other concerns at this time. Continue with your current medication regimen.  Follow up as discussed.  Please let me know if you have any questions.

## 2022-08-23 ENCOUNTER — Ambulatory Visit
Admission: RE | Admit: 2022-08-23 | Discharge: 2022-08-23 | Disposition: A | Discharge status: Home / Self Care | Payer: BC Managed Care – PPO | Source: Ambulatory Visit | Attending: Nurse Practitioner | Admitting: Nurse Practitioner

## 2022-08-23 DIAGNOSIS — Z1231 Encounter for screening mammogram for malignant neoplasm of breast: Secondary | ICD-10-CM | POA: Diagnosis present

## 2022-08-28 NOTE — Progress Notes (Signed)
Please let patient know her Mammogram did not show any evidence of a malignancy.  The recommendation is to repeat the Mammogram in 1 year.  

## 2022-12-14 NOTE — Progress Notes (Signed)
BP 127/86   Temp 98.8 F (37.1 C) (Oral)   Wt 207 lb 9.6 oz (94.2 kg)   LMP 11/25/2022 (Approximate)   SpO2 98%   BMI 37.37 kg/m    Subjective:    Patient ID: Lynn Vargas, female    DOB: 1981-01-08, 42 y.o.   MRN: [id number]  HPI: Lynn Vargas is a 42 y.o. female  Chief Complaint  Patient presents with   Facial Swelling    Pt states she noticed it 3 days ago   EYE SWELLING Duration:  days (3) Involved eye:  right Onset: sudden Severity: mild  Quality:  feels stretched when she meses with it Foreign body sensation:no Visual impairment: no Eye redness: no Discharge: no Crusting or matting of eyelids: no Swelling: yes Photophobia: no Itching: yes Tearing: yes Headache: no Floaters: no URI symptoms: no Contact lens use: no Close contacts with similar problems: no Eye trauma: no Aggravating factors:  Alleviating factors:  Status: worse Treatments attempted:  Relevant past medical, surgical, family and social history reviewed and updated as indicated. Interim medical history since our last visit reviewed. Allergies and medications reviewed and updated.  Review of Systems  HENT:  Negative for congestion.   Eyes:  Negative for photophobia, pain, redness and visual disturbance.       Right eye lid swelling  Neurological:  Negative for headaches.    Per HPI unless specifically indicated above     Objective:    BP 127/86   Temp 98.8 F (37.1 C) (Oral)   Wt 207 lb 9.6 oz (94.2 kg)   LMP 11/25/2022 (Approximate)   SpO2 98%   BMI 37.37 kg/m   Wt Readings from Last 3 Encounters:  12/15/22 207 lb 9.6 oz (94.2 kg)  07/24/22 205 lb 3.2 oz (93.1 kg)  05/16/22 212 lb (96.2 kg)    Physical Exam Vitals and nursing note reviewed.  Constitutional:      General: She is not in acute distress.    Appearance: Normal appearance. She is normal weight. She is not ill-appearing, toxic-appearing or diaphoretic.  HENT:     Head: Normocephalic.     Right Ear:  External ear normal.     Left Ear: External ear normal.     Nose: Nose normal.     Mouth/Throat:     Mouth: Mucous membranes are moist.     Pharynx: Oropharynx is clear.  Eyes:     General:        Right eye: No discharge.        Left eye: No discharge.     Extraocular Movements: Extraocular movements intact.     Conjunctiva/sclera: Conjunctivae normal.     Pupils: Pupils are equal, round, and reactive to light.   Cardiovascular:     Rate and Rhythm: Normal rate and regular rhythm.     Heart sounds: No murmur heard. Pulmonary:     Effort: Pulmonary effort is normal. No respiratory distress.     Breath sounds: Normal breath sounds. No wheezing or rales.  Musculoskeletal:     Cervical back: Normal range of motion and neck supple.  Skin:    General: Skin is warm and dry.     Capillary Refill: Capillary refill takes less than 2 seconds.  Neurological:     General: No focal deficit present.     Mental Status: She is alert and oriented to person, place, and time. Mental status is at baseline.  Psychiatric:  Mood and Affect: Mood normal.        Behavior: Behavior normal.        Thought Content: Thought content normal.        Judgment: Judgment normal.     Results for orders placed or performed in visit on 07/24/22  CBC with Differential/Platelet  Result Value Ref Range   WBC 5.5 3.4 - 10.8 x10E3/uL   RBC 4.18 3.77 - 5.28 x10E6/uL   Hemoglobin 12.2 11.1 - 15.9 g/dL   Hematocrit 16.1 09.6 - 46.6 %   MCV 87 79 - 97 fL   MCH 29.2 26.6 - 33.0 pg   MCHC 33.5 31.5 - 35.7 g/dL   RDW 04.5 40.9 - 81.1 %   Platelets 252 150 - 450 x10E3/uL   Neutrophils 50 Not Estab. %   Lymphs 33 Not Estab. %   Monocytes 9 Not Estab. %   Eos 7 Not Estab. %   Basos 1 Not Estab. %   Neutrophils Absolute 2.7 1.4 - 7.0 x10E3/uL   Lymphocytes Absolute 1.8 0.7 - 3.1 x10E3/uL   Monocytes Absolute 0.5 0.1 - 0.9 x10E3/uL   EOS (ABSOLUTE) 0.4 0.0 - 0.4 x10E3/uL   Basophils Absolute 0.0 0.0 - 0.2  x10E3/uL   Immature Granulocytes 0 Not Estab. %   Immature Grans (Abs) 0.0 0.0 - 0.1 x10E3/uL  Comprehensive metabolic panel  Result Value Ref Range   Glucose 88 70 - 99 mg/dL   BUN 10 6 - 24 mg/dL   Creatinine, Ser 9.14 0.57 - 1.00 mg/dL   eGFR 92 >78 GN/FAO/1.30   BUN/Creatinine Ratio 12 9 - 23   Sodium 137 134 - 144 mmol/L   Potassium 4.1 3.5 - 5.2 mmol/L   Chloride 102 96 - 106 mmol/L   CO2 24 20 - 29 mmol/L   Calcium 9.8 8.7 - 10.2 mg/dL   Total Protein 6.9 6.0 - 8.5 g/dL   Albumin 4.4 3.9 - 4.9 g/dL   Globulin, Total 2.5 1.5 - 4.5 g/dL   Albumin/Globulin Ratio 1.8 1.2 - 2.2   Bilirubin Total 0.5 0.0 - 1.2 mg/dL   Alkaline Phosphatase 48 44 - 121 IU/L   AST 19 0 - 40 IU/L   ALT 18 0 - 32 IU/L  Lipid panel  Result Value Ref Range   Cholesterol, Total 187 100 - 199 mg/dL   Triglycerides 62 0 - 149 mg/dL   HDL 60 >86 mg/dL   VLDL Cholesterol Cal 12 5 - 40 mg/dL   LDL Chol Calc (NIH) 578 (H) 0 - 99 mg/dL   Chol/HDL Ratio 3.1 0.0 - 4.4 ratio  TSH  Result Value Ref Range   TSH 1.850 0.450 - 4.500 uIU/mL  Urinalysis, Routine w reflex microscopic  Result Value Ref Range   Specific Gravity, UA 1.015 1.005 - 1.030   pH, UA 7.0 5.0 - 7.5   Color, UA Yellow Yellow   Appearance Ur Clear Clear   Leukocytes,UA Negative Negative   Protein,UA Negative Negative/Trace   Glucose, UA Negative Negative   Ketones, UA Negative Negative   RBC, UA Negative Negative   Bilirubin, UA Negative Negative   Urobilinogen, Ur 0.2 0.2 - 1.0 mg/dL   Nitrite, UA Negative Negative   Microscopic Examination Comment   Hepatitis C Antibody  Result Value Ref Range   Hep C Virus Ab Non Reactive Non Reactive  Vitamin D (25 hydroxy)  Result Value Ref Range   Vit D, 25-Hydroxy 44.1 30.0 - 100.0 ng/mL  B12  Result Value Ref Range   Vitamin B-12 752 232 - 1,245 pg/mL      Assessment & Plan:   Problem List Items Addressed This Visit   None Visit Diagnoses     Swelling of eyelid, right    -   Primary   Will treat with erythromycin ointment.  Compelte course of antibiotics.  Follow up if not improved.        Follow up plan: No follow-ups on file.

## 2022-12-15 ENCOUNTER — Encounter: Payer: Self-pay | Admitting: Nurse Practitioner

## 2022-12-15 ENCOUNTER — Ambulatory Visit: Payer: BC Managed Care – PPO | Admitting: Nurse Practitioner

## 2022-12-15 VITALS — BP 127/86 | Temp 98.8°F | Wt 207.6 lb

## 2022-12-15 DIAGNOSIS — H02843 Edema of right eye, unspecified eyelid: Secondary | ICD-10-CM

## 2022-12-15 MED ORDER — ERYTHROMYCIN 5 MG/GM OP OINT: 1.0000 | TOPICAL_OINTMENT | Freq: Every day | OPHTHALMIC | 0 refills | Status: AC

## 2022-12-24 ENCOUNTER — Encounter (HOSPITAL_COMMUNITY): Payer: Self-pay

## 2022-12-24 ENCOUNTER — Other Ambulatory Visit: Payer: Self-pay

## 2022-12-24 ENCOUNTER — Emergency Department (HOSPITAL_COMMUNITY)
Admission: EM | Admit: 2022-12-24 | Discharge: 2022-12-25 | Disposition: A | Discharge status: Home / Self Care | Payer: BC Managed Care – PPO | Attending: Emergency Medicine | Admitting: Emergency Medicine

## 2022-12-24 DIAGNOSIS — Z91040 Latex allergy status: Secondary | ICD-10-CM | POA: Insufficient documentation

## 2022-12-24 DIAGNOSIS — R591 Generalized enlarged lymph nodes: Secondary | ICD-10-CM

## 2022-12-24 DIAGNOSIS — R590 Localized enlarged lymph nodes: Secondary | ICD-10-CM | POA: Insufficient documentation

## 2022-12-24 DIAGNOSIS — I10 Essential (primary) hypertension: Secondary | ICD-10-CM | POA: Insufficient documentation

## 2022-12-24 DIAGNOSIS — L02811 Cutaneous abscess of head [any part, except face]: Secondary | ICD-10-CM | POA: Insufficient documentation

## 2022-12-24 DIAGNOSIS — L0291 Cutaneous abscess, unspecified: Secondary | ICD-10-CM

## 2022-12-24 DIAGNOSIS — R220 Localized swelling, mass and lump, head: Secondary | ICD-10-CM | POA: Diagnosis present

## 2022-12-24 NOTE — ED Triage Notes (Signed)
Pt. Arrives stating that she has a lump to the scalp that is painful and making her head hurt. She states that the lump is getting more painful and it is moving up her neck. She came in because she didn't want it to move any further. She noticed the lump on Thursday along with one under each of her armpits. She states that those 2 have gone down.

## 2022-12-25 ENCOUNTER — Ambulatory Visit: Payer: BC Managed Care – PPO | Admitting: Nurse Practitioner

## 2022-12-25 ENCOUNTER — Encounter: Payer: Self-pay | Admitting: Nurse Practitioner

## 2022-12-25 ENCOUNTER — Telehealth: Payer: Self-pay

## 2022-12-25 VITALS — BP 123/85 | HR 82 | Temp 99.5°F | Wt 203.8 lb

## 2022-12-25 DIAGNOSIS — L0291 Cutaneous abscess, unspecified: Secondary | ICD-10-CM | POA: Diagnosis not present

## 2022-12-25 LAB — CBC WITH DIFFERENTIAL/PLATELET
Abs Immature Granulocytes: 0.03 10*3/uL (ref 0.00–0.07)
Basophils Absolute: 0.1 10*3/uL (ref 0.0–0.1)
Basophils Relative: 1 %
Eosinophils Absolute: 0.3 10*3/uL (ref 0.0–0.5)
Eosinophils Relative: 4 %
HCT: 34.6 % — ABNORMAL LOW (ref 36.0–46.0)
Hemoglobin: 11.7 g/dL — ABNORMAL LOW (ref 12.0–15.0)
Immature Granulocytes: 0 %
Lymphocytes Relative: 22 %
Lymphs Abs: 1.9 10*3/uL (ref 0.7–4.0)
MCH: 28.9 pg (ref 26.0–34.0)
MCHC: 33.8 g/dL (ref 30.0–36.0)
MCV: 85.4 fL (ref 80.0–100.0)
Monocytes Absolute: 0.8 10*3/uL (ref 0.1–1.0)
Monocytes Relative: 9 %
Neutro Abs: 5.3 10*3/uL (ref 1.7–7.7)
Neutrophils Relative %: 64 %
Platelets: 256 10*3/uL (ref 150–400)
RBC: 4.05 MIL/uL (ref 3.87–5.11)
RDW: 13 % (ref 11.5–15.5)
WBC: 8.3 10*3/uL (ref 4.0–10.5)
nRBC: 0 % (ref 0.0–0.2)

## 2022-12-25 LAB — COMPREHENSIVE METABOLIC PANEL
ALT: 20 U/L (ref 0–44)
AST: 17 U/L (ref 15–41)
Albumin: 3.7 g/dL (ref 3.5–5.0)
Alkaline Phosphatase: 46 U/L (ref 38–126)
Anion gap: 6 (ref 5–15)
BUN: 14 mg/dL (ref 6–20)
CO2: 25 mmol/L (ref 22–32)
Calcium: 8.8 mg/dL — ABNORMAL LOW (ref 8.9–10.3)
Chloride: 103 mmol/L (ref 98–111)
Creatinine, Ser: 0.66 mg/dL (ref 0.44–1.00)
GFR, Estimated: >60 mL/min (ref >60)
Glucose, Bld: 122 mg/dL — ABNORMAL HIGH (ref 70–99)
Potassium: 3.5 mmol/L (ref 3.5–5.1)
Sodium: 134 mmol/L — ABNORMAL LOW (ref 135–145)
Total Bilirubin: 0.4 mg/dL (ref 0.3–1.2)
Total Protein: 7.4 g/dL (ref 6.5–8.1)

## 2022-12-25 LAB — I-STAT BETA HCG BLOOD, ED (MC, WL, AP ONLY): I-stat hCG, quantitative: 14.6 m[IU]/mL — ABNORMAL HIGH (ref <5)

## 2022-12-25 MED ORDER — DOXYCYCLINE HYCLATE 100 MG PO CAPS: 100.0000 mg | ORAL_CAPSULE | Freq: Two times a day (BID) | ORAL | 0 refills | Status: DC

## 2022-12-25 MED ORDER — LIDOCAINE-EPINEPHRINE-TETRACAINE (LET) TOPICAL GEL
3.0000 mL | Freq: Once | TOPICAL | Status: AC
  Administered 2022-12-25: 3 mL via TOPICAL
  Filled 2022-12-25: qty 3

## 2022-12-25 MED ORDER — LIDOCAINE-EPINEPHRINE (PF) 2 %-1:200000 IJ SOLN
10.0000 mL | Freq: Once | INTRAMUSCULAR | Status: AC
  Administered 2022-12-25: 10 mL
  Filled 2022-12-25: qty 20

## 2022-12-25 MED ORDER — DOXYCYCLINE HYCLATE 100 MG PO TABS
100.0000 mg | ORAL_TABLET | Freq: Once | ORAL | Status: AC
  Administered 2022-12-25: 100 mg via ORAL
  Filled 2022-12-25: qty 1

## 2022-12-25 NOTE — Discharge Instructions (Signed)
Take the antibiotics as prescribed.  Perform warm soaks of the area twice daily.  Follow-up with your doctor for wound check in 2 days.  As we discussed, the bumps in your armpits are likely lymph nodes.  If they do not improve, they may need further evaluation by your doctor for consideration of biopsy.  There is no indication of something like lymphoma today but this cannot be ruled out completely.  Return to the ED with difficulty breathing, difficulty swallowing or any other concerns.

## 2022-12-25 NOTE — Progress Notes (Signed)
BP 123/85   Pulse 82   Temp 99.5 F (37.5 C) (Oral)   Wt 203 lb 12.8 oz (92.4 kg)   LMP 11/25/2022 (Approximate)   SpO2 99%   BMI 36.68 kg/m    Subjective:    Patient ID: Lynn Vargas, female    DOB: 12-Aug-1980, 42 y.o.   MRN: [id number]  HPI: Lynn Vargas is a 42 y.o. female  Chief Complaint  Patient presents with   ER Follow Up    Pt states she was seen at the ER last night and had an abscess drained. States she is currently on an antibiotic for it and is not having pain like she was.    Patient was seen in the ER yesterday for Left occipital pustule on her scalp.  Patient has the Abscess I and D in the ER.  She was sent home on Doxycyline to complete the course of medication.  It was also noted that she has folliculitis on her exam.  It was recommended that she have a wound check in two days.    It was noticed that she had firm lymph nodes in her axilla likely due to infection.  If they do not resolve it was recommended she have further evaluation.  CBC WNL in ER.   Relevant past medical, surgical, family and social history reviewed and updated as indicated. Interim medical history since our last visit reviewed. Allergies and medications reviewed and updated.  Review of Systems  Skin:  Positive for rash.       Abscess on scalp    Per HPI unless specifically indicated above     Objective:    BP 123/85   Pulse 82   Temp 99.5 F (37.5 C) (Oral)   Wt 203 lb 12.8 oz (92.4 kg)   LMP 11/25/2022 (Approximate)   SpO2 99%   BMI 36.68 kg/m   Wt Readings from Last 3 Encounters:  12/25/22 203 lb 12.8 oz (92.4 kg)  12/15/22 207 lb 9.6 oz (94.2 kg)  07/24/22 205 lb 3.2 oz (93.1 kg)    Physical Exam Vitals and nursing note reviewed.  Constitutional:      General: She is not in acute distress.    Appearance: Normal appearance. She is normal weight. She is not ill-appearing, toxic-appearing or diaphoretic.  HENT:     Head: Normocephalic.     Right Ear: External  ear normal.     Left Ear: External ear normal.     Nose: Nose normal.     Mouth/Throat:     Mouth: Mucous membranes are moist.     Pharynx: Oropharynx is clear.  Eyes:     General:        Right eye: No discharge.        Left eye: No discharge.     Extraocular Movements: Extraocular movements intact.     Conjunctiva/sclera: Conjunctivae normal.     Pupils: Pupils are equal, round, and reactive to light.  Cardiovascular:     Rate and Rhythm: Normal rate and regular rhythm.     Heart sounds: No murmur heard. Pulmonary:     Effort: Pulmonary effort is normal. No respiratory distress.     Breath sounds: Normal breath sounds. No wheezing or rales.  Musculoskeletal:     Cervical back: Normal range of motion and neck supple.  Skin:    General: Skin is warm and dry.     Capillary Refill: Capillary refill takes less than 2 seconds.  Neurological:     General: No focal deficit present.     Mental Status: She is alert and oriented to person, place, and time. Mental status is at baseline.  Psychiatric:        Mood and Affect: Mood normal.        Behavior: Behavior normal.        Thought Content: Thought content normal.        Judgment: Judgment normal.     Results for orders placed or performed during the hospital encounter of 12/24/22  CBC with Differential  Result Value Ref Range   WBC 8.3 4.0 - 10.5 K/uL   RBC 4.05 3.87 - 5.11 MIL/uL   Hemoglobin 11.7 (L) 12.0 - 15.0 g/dL   HCT 16.1 (L) 09.6 - 04.5 %   MCV 85.4 80.0 - 100.0 fL   MCH 28.9 26.0 - 34.0 pg   MCHC 33.8 30.0 - 36.0 g/dL   RDW 40.9 81.1 - 91.4 %   Platelets 256 150 - 400 K/uL   nRBC 0.0 0.0 - 0.2 %   Neutrophils Relative % 64 %   Neutro Abs 5.3 1.7 - 7.7 K/uL   Lymphocytes Relative 22 %   Lymphs Abs 1.9 0.7 - 4.0 K/uL   Monocytes Relative 9 %   Monocytes Absolute 0.8 0.1 - 1.0 K/uL   Eosinophils Relative 4 %   Eosinophils Absolute 0.3 0.0 - 0.5 K/uL   Basophils Relative 1 %   Basophils Absolute 0.1 0.0  - 0.1 K/uL   Immature Granulocytes 0 %   Abs Immature Granulocytes 0.03 0.00 - 0.07 K/uL  Comprehensive metabolic panel  Result Value Ref Range   Sodium 134 (L) 135 - 145 mmol/L   Potassium 3.5 3.5 - 5.1 mmol/L   Chloride 103 98 - 111 mmol/L   CO2 25 22 - 32 mmol/L   Glucose, Bld 122 (H) 70 - 99 mg/dL   BUN 14 6 - 20 mg/dL   Creatinine, Ser 7.82 0.44 - 1.00 mg/dL   Calcium 8.8 (L) 8.9 - 10.3 mg/dL   Total Protein 7.4 6.5 - 8.1 g/dL   Albumin 3.7 3.5 - 5.0 g/dL   AST 17 15 - 41 U/L   ALT 20 0 - 44 U/L   Alkaline Phosphatase 46 38 - 126 U/L   Total Bilirubin 0.4 0.3 - 1.2 mg/dL   GFR, Estimated >95 >62 mL/min   Anion gap 6 5 - 15  I-Stat beta hCG blood, ED ([facility], [facility], [facility] only)  Result Value Ref Range   I-stat hCG, quantitative 14.6 (H) <5 mIU/mL   Comment 3              Assessment & Plan:   Problem List Items Addressed This Visit   None Visit Diagnoses     Abscess    -  Primary   Complete course of antibiotics. Continue with warm flushes as discussed be the ER. If lymph nodes do not resolve, return to clinic, can consider ultrasound.        Follow up plan: Return in about 3 days (around 12/28/2022) for Wound check.

## 2022-12-25 NOTE — Transitions of Care (Post Inpatient/ED Visit) (Signed)
   12/25/2022  Name: Lynn Vargas MRN: [id number] DOB: 1981-03-24  Today's TOC FU Call Status: Today's TOC FU Call Status:: Successful TOC FU Call Competed TOC FU Call Complete Date: 12/25/22  Transition Care Management Follow-up Telephone Call Date of Discharge: 12/25/22 Discharge Facility: [facility]) Type of Discharge: Emergency Department Reason for ED Visit: Other: How have you been since you were released from the hospital?: Better Any questions or concerns?: No  Items Reviewed: Did you receive and understand the discharge instructions provided?: Yes Medications obtained,verified, and reconciled?: Yes (Medications Reviewed) Any new allergies since your discharge?: No Dietary orders reviewed?: NA Do you have support at home?: No  Medications Reviewed Today: Medications Reviewed Today     Reviewed by Pablo Ledger, CMA (Certified Medical Assistant) on 12/25/22 at 1111  Med List Status: <None>   Medication Order Taking? Sig Documenting Provider Last Dose Status Informant  Biotin 5000 MCG CAPS [id number] Yes Take 1 capsule by mouth daily. [provider] Taking Active   doxycycline (VIBRAMYCIN) 100 MG capsule [id number] Yes Take 1 capsule (100 mg total) by mouth 2 (two) times daily. Glynn Octave, MD Taking Active   VITAMIN D PO [id number] Yes Take 5,000 Units by mouth daily. [provider] Taking Active             Home Care and Equipment/Supplies: Were Home Health Services Ordered?: NA Any new equipment or medical supplies ordered?: NA  Functional Questionnaire: Do you need assistance with bathing/showering or dressing?: No Do you need assistance with meal preparation?: No Do you need assistance with eating?: No Do you have difficulty maintaining continence: No Do you need assistance with getting out of bed/getting out of a chair/moving?: No Do you have difficulty managing or taking your medications?: No  Follow up appointments  reviewed: PCP Follow-up appointment confirmed?: Yes Date of PCP follow-up appointment?: 12/25/22 Follow-up Provider: Larae Grooms, NP-C [facility] Follow-up appointment confirmed?: No Do you need transportation to your follow-up appointment?: No Do you understand care options if your condition(s) worsen?: Yes-patient verbalized understanding    SIGNATURE: Wilhemena Durie, CMA

## 2022-12-25 NOTE — ED Provider Notes (Signed)
[facility] Provider Note   CSN: [id number] Arrival date & time: 12/24/22  2302     History  Chief Complaint  Patient presents with   Mass    Lynn Vargas is a 42 y.o. female.  Patient with a history of hypertension, hyperlipidemia presenting with "bump" to her left occipital scalp for the past 3 days.  Denies any fall or head injury or trauma.  Bump is getting more painful.  No bleeding or drainage.  Does have hair extensions but no recent manipulation.  No fevers, chills, nausea or vomiting.  Did have some bumps in her bilateral armpits a few days ago but these seem to have resolved.  No bumps in her groin.  No history of fevers.  No chest pain or shortness of breath.  No night sweats or weight loss.  No history of underlying hematological or oncological disorders.  The history is provided by the patient.       Home Medications Prior to Admission medications   Medication Sig Start Date End Date Taking? Authorizing Provider  Biotin 5000 MCG CAPS Take 1 capsule by mouth daily.    [provider]  VITAMIN D PO Take 5,000 Units by mouth daily.    [provider]      Allergies    Latex    Review of Systems   Review of Systems  Constitutional:  Negative for activity change, appetite change and fever.  HENT:  Negative for dental problem and rhinorrhea.   Respiratory:  Negative for cough, chest tightness and shortness of breath.   Cardiovascular:  Negative for chest pain.  Gastrointestinal:  Negative for abdominal pain, nausea and vomiting.  Genitourinary:  Negative for dysuria and hematuria.  Musculoskeletal:  Negative for arthralgias and myalgias.  Skin:  Positive for wound. Negative for rash.  Neurological:  Negative for weakness and headaches.   all other systems are negative except as noted in the HPI and PMH.    Physical Exam Updated Vital Signs BP (!) 155/107 (BP Location: Left Arm)   Pulse 82    Temp 98.7 F (37.1 C) (Oral)   Resp 18   LMP 11/25/2022 (Approximate)   SpO2 99%  Physical Exam Vitals and nursing note reviewed.  Constitutional:      General: She is not in acute distress.    Appearance: She is well-developed.  HENT:     Head: Normocephalic.     Comments: Pustule left occipital scalp with surrounding erythema and induration.    Mouth/Throat:     Pharynx: No oropharyngeal exudate.  Eyes:     Conjunctiva/sclera: Conjunctivae normal.     Pupils: Pupils are equal, round, and reactive to light.  Neck:     Comments: No meningismus. Cardiovascular:     Rate and Rhythm: Normal rate and regular rhythm.     Heart sounds: Normal heart sounds. No murmur heard. Pulmonary:     Effort: Pulmonary effort is normal. No respiratory distress.     Breath sounds: Normal breath sounds.  Abdominal:     Palpations: Abdomen is soft.     Tenderness: There is no abdominal tenderness. There is no guarding or rebound.  Musculoskeletal:        General: No tenderness. Normal range of motion.     Cervical back: Normal range of motion and neck supple.     Comments: Chaperone present [employer].  Patient does have 0.5 cm firm nodule to left and right  armpit.  No surrounding erythema or induration or fluctuance.  Skin:    General: Skin is warm.  Neurological:     Mental Status: She is alert and oriented to person, place, and time.     Cranial Nerves: No cranial nerve deficit.     Motor: No abnormal muscle tone.     Coordination: Coordination normal.     Comments: No ataxia on finger to nose bilaterally. No pronator drift. 5/5 strength throughout. CN 2-12 intact.Equal grip strength. Sensation intact.   Psychiatric:        Behavior: Behavior normal.     ED Results / Procedures / Treatments   Labs (all labs ordered are listed, but only abnormal results are displayed) Labs Reviewed  CBC WITH DIFFERENTIAL/PLATELET - Abnormal; Notable for the following components:      Result Value    Hemoglobin 11.7 (*)    HCT 34.6 (*)    All other components within normal limits  COMPREHENSIVE METABOLIC PANEL - Abnormal; Notable for the following components:   Sodium 134 (*)    Glucose, Bld 122 (*)    Calcium 8.8 (*)    All other components within normal limits  I-STAT BETA HCG BLOOD, ED ([facility], [facility], [facility] ONLY) - Abnormal; Notable for the following components:   I-stat hCG, quantitative 14.6 (*)    All other components within normal limits    EKG None  Radiology No results found.  Procedures .Marland KitchenIncision and Drainage  Date/Time: 12/25/2022 2:06 AM  Performed by: Glynn Octave, MD Authorized by: Glynn Octave, MD   Consent:    Consent obtained:  Verbal   Consent given by:  Patient   Risks, benefits, and alternatives were discussed: yes     Risks discussed:  Damage to other organs, bleeding, incomplete drainage, infection and pain Universal protocol:    Procedure explained and questions answered to patient or proxy's satisfaction: yes     Relevant documents present and verified: yes     Patient identity confirmed:  Verbally with patient Location:    Type:  Abscess   Size:  0.5   Location:  Head   Head location:  Scalp Pre-procedure details:    Skin preparation:  Povidone-iodine Anesthesia:    Anesthesia method:  Local infiltration and topical application   Topical anesthetic:  LET   Local anesthetic:  Lidocaine 2% WITH epi Procedure type:    Complexity:  Simple Procedure details:    Ultrasound guidance: no     Needle aspiration: yes     Needle size:  18 G   Incision types:  Stab incision   Incision depth:  Subcutaneous   Wound management:  Probed and deloculated and irrigated with saline   Drainage:  Purulent   Drainage amount:  Moderate   Wound treatment:  Wound left open   Packing materials:  None Post-procedure details:    Procedure completion:  Tolerated     Medications Ordered in ED Medications  lidocaine-EPINEPHrine-tetracaine (LET) topical gel  (has no administration in time range)    ED Course/ Medical Decision Making/ A&P                             Medical Decision Making Amount and/or Complexity of Data Reviewed Labs: ordered. Decision-making details documented in ED Course. Radiology: ordered and independent interpretation performed. Decision-making details documented in ED Course. ECG/medicine tests: ordered and independent interpretation performed. Decision-making details documented in ED Course.  Risk Prescription  drug management.   Patient with pustule to left posterior scalp for several days.  Also was concerned about some bumps to her armpits.  No fevers, chills, nausea, vomiting, weight loss or night sweats.  Exam consistent with folliculitis and small abscess to her scalp.  Incision and drainage was performed as above.  Does have some firm nodules to her bilateral axilla without fluctuance or erythema.  Discussed these are likely lymph nodes but if they do not improve may need further evaluation by her PCP for consideration of biopsy.  Labs today show no leukocytosis.  No hyperglycemia.  Will add her folliculitis and abscess with antibiotics, warm flushes discussed./ Wound check in 2 days.  Return precautions discussed        Final Clinical Impression(s) / ED Diagnoses Final diagnoses:  Abscess  Lymphadenopathy    Rx / DC Orders ED Discharge Orders     None         Saira Kramme, Jeannett Senior, MD 12/25/22 [phone]

## 2022-12-28 ENCOUNTER — Encounter: Payer: Self-pay | Admitting: Nurse Practitioner

## 2022-12-28 ENCOUNTER — Ambulatory Visit: Payer: BC Managed Care – PPO | Admitting: Nurse Practitioner

## 2022-12-28 VITALS — BP 129/89 | HR 78 | Temp 98.5°F | Wt 209.0 lb

## 2022-12-28 DIAGNOSIS — L0291 Cutaneous abscess, unspecified: Secondary | ICD-10-CM | POA: Diagnosis not present

## 2022-12-28 NOTE — Progress Notes (Signed)
BP 129/89   Pulse 78   Temp 98.5 F (36.9 C) (Oral)   Wt 209 lb (94.8 kg)   LMP 12/21/2022 (Approximate)   SpO2 97%   BMI 37.62 kg/m    Subjective:    Patient ID: Lynn Vargas, female    DOB: 05/16/1981, 42 y.o.   MRN: [id number]  HPI: Lynn Vargas is a 42 y.o. female  Chief Complaint  Patient presents with   Follow-up   Patient was seen in the ER yesterday for Left occipital pustule on her scalp on 12/24/22.  Patient has the Abscess I & D in the ER.  She was sent home on Doxycyline to complete the course of medication.  It was also noted that she has folliculitis on her exam.  It was recommended that she have a wound check in two days.  Patient seen today for wound check.     Relevant past medical, surgical, family and social history reviewed and updated as indicated. Interim medical history since our last visit reviewed. Allergies and medications reviewed and updated.  Review of Systems  Skin:        Abscess on scalp    Per HPI unless specifically indicated above     Objective:    BP 129/89   Pulse 78   Temp 98.5 F (36.9 C) (Oral)   Wt 209 lb (94.8 kg)   LMP 12/21/2022 (Approximate)   SpO2 97%   BMI 37.62 kg/m   Wt Readings from Last 3 Encounters:  12/28/22 209 lb (94.8 kg)  12/25/22 203 lb 12.8 oz (92.4 kg)  12/15/22 207 lb 9.6 oz (94.2 kg)    Physical Exam Vitals and nursing note reviewed.  Constitutional:      General: She is not in acute distress.    Appearance: Normal appearance. She is normal weight. She is not ill-appearing, toxic-appearing or diaphoretic.  HENT:     Head: Normocephalic.     Right Ear: External ear normal.     Left Ear: External ear normal.     Nose: Nose normal.     Mouth/Throat:     Mouth: Mucous membranes are moist.     Pharynx: Oropharynx is clear.  Eyes:     General:        Right eye: No discharge.        Left eye: No discharge.     Extraocular Movements: Extraocular movements intact.     Conjunctiva/sclera:  Conjunctivae normal.     Pupils: Pupils are equal, round, and reactive to light.  Cardiovascular:     Rate and Rhythm: Normal rate and regular rhythm.     Heart sounds: No murmur heard. Pulmonary:     Effort: Pulmonary effort is normal. No respiratory distress.     Breath sounds: Normal breath sounds. No wheezing or rales.  Musculoskeletal:     Cervical back: Normal range of motion and neck supple.  Skin:    General: Skin is warm and dry.     Capillary Refill: Capillary refill takes less than 2 seconds.       Neurological:     General: No focal deficit present.     Mental Status: She is alert and oriented to person, place, and time. Mental status is at baseline.  Psychiatric:        Mood and Affect: Mood normal.        Behavior: Behavior normal.        Thought Content: Thought content normal.  Judgment: Judgment normal.     Results for orders placed or performed during the hospital encounter of 12/24/22  CBC with Differential  Result Value Ref Range   WBC 8.3 4.0 - 10.5 K/uL   RBC 4.05 3.87 - 5.11 MIL/uL   Hemoglobin 11.7 (L) 12.0 - 15.0 g/dL   HCT 16.1 (L) 09.6 - 04.5 %   MCV 85.4 80.0 - 100.0 fL   MCH 28.9 26.0 - 34.0 pg   MCHC 33.8 30.0 - 36.0 g/dL   RDW 40.9 81.1 - 91.4 %   Platelets 256 150 - 400 K/uL   nRBC 0.0 0.0 - 0.2 %   Neutrophils Relative % 64 %   Neutro Abs 5.3 1.7 - 7.7 K/uL   Lymphocytes Relative 22 %   Lymphs Abs 1.9 0.7 - 4.0 K/uL   Monocytes Relative 9 %   Monocytes Absolute 0.8 0.1 - 1.0 K/uL   Eosinophils Relative 4 %   Eosinophils Absolute 0.3 0.0 - 0.5 K/uL   Basophils Relative 1 %   Basophils Absolute 0.1 0.0 - 0.1 K/uL   Immature Granulocytes 0 %   Abs Immature Granulocytes 0.03 0.00 - 0.07 K/uL  Comprehensive metabolic panel  Result Value Ref Range   Sodium 134 (L) 135 - 145 mmol/L   Potassium 3.5 3.5 - 5.1 mmol/L   Chloride 103 98 - 111 mmol/L   CO2 25 22 - 32 mmol/L   Glucose, Bld 122 (H) 70 - 99 mg/dL   BUN 14 6 - 20 mg/dL    Creatinine, Ser 7.82 0.44 - 1.00 mg/dL   Calcium 8.8 (L) 8.9 - 10.3 mg/dL   Total Protein 7.4 6.5 - 8.1 g/dL   Albumin 3.7 3.5 - 5.0 g/dL   AST 17 15 - 41 U/L   ALT 20 0 - 44 U/L   Alkaline Phosphatase 46 38 - 126 U/L   Total Bilirubin 0.4 0.3 - 1.2 mg/dL   GFR, Estimated >95 >62 mL/min   Anion gap 6 5 - 15  I-Stat beta hCG blood, ED ([facility], [facility], [facility] only)  Result Value Ref Range   I-stat hCG, quantitative 14.6 (H) <5 mIU/mL   Comment 3              Assessment & Plan:   Problem List Items Addressed This Visit   None Visit Diagnoses     Abscess    -  Primary   Complete course of antibiotics. Continue with wound care. Okay to leave open to air.  Discussed s/s to monitor for.  Follow up if not improved.        Follow up plan: Return if symptoms worsen or fail to improve.

## 2023-01-08 ENCOUNTER — Ambulatory Visit: Payer: BC Managed Care – PPO | Admitting: Physician Assistant

## 2023-01-08 ENCOUNTER — Encounter: Payer: Self-pay | Admitting: Physician Assistant

## 2023-01-08 VITALS — BP 130/83 | HR 71 | Temp 98.3°F | Wt 209.0 lb

## 2023-01-08 DIAGNOSIS — R21 Rash and other nonspecific skin eruption: Secondary | ICD-10-CM | POA: Diagnosis not present

## 2023-01-08 DIAGNOSIS — B86 Scabies: Secondary | ICD-10-CM | POA: Diagnosis not present

## 2023-01-08 MED ORDER — PERMETHRIN 5 % EX CREA
TOPICAL_CREAM | CUTANEOUS | 1 refills | Status: DC
Start: 2023-01-08 — End: 2023-02-23

## 2023-01-08 NOTE — Progress Notes (Signed)
Acute Office Visit   Patient: Lynn Vargas   DOB: 04-09-1981   42 y.o. Female  MRN: [id number] Visit Date: 01/08/2023  Today's healthcare provider: Oswaldo Conroy Byan Poplaski, PA-C  Introduced myself to the patient as a Secondary school teacher and provided education on APPs in clinical practice.    Chief Complaint  Patient presents with  . Rash    Pt states she has a rash on various places on her body- legs, arms, abdomen, and R side. States she is very itchy. States she has tried some OTC creams and medications that help some but then the rash and itching comes back.    Subjective    HPI HPI     Rash    Additional comments: Pt states she has a rash on various places on her body- legs, arms, abdomen, and R side. States she is very itchy. States she has tried some OTC creams and medications that help some but then the rash and itching comes back.       Last edited by Pablo Ledger, CMA on 01/08/2023  2:59 PM.      Addison Bailey Duration:  days started while she was taking Doxycycline.  Location: trunk and arms  Itching: yes Burning: yes when she puts on Cortisone cream  Redness: yes Oozing: no Scaling: no Blisters: no Painful: no Fevers: no Change in detergents/soaps/personal care products: yes She has been using multiple skin treatments with essential oils, lotions, and  Recent illness: no Recent travel:no History of same: no Context: worse and fluctuating Alleviating factors: nothing Treatments attempted:hydrocortisone cream and lotion/moisturizer Shortness of breath: no  Throat/tongue swelling: no Myalgias/arthralgias: no   Medications: Outpatient Medications Prior to Visit  Medication Sig  . Biotin 5000 MCG CAPS Take 1 capsule by mouth daily.  Marland Kitchen VITAMIN D PO Take 5,000 Units by mouth daily.  . [DISCONTINUED] doxycycline (VIBRAMYCIN) 100 MG capsule Take 1 capsule (100 mg total) by mouth 2 (two) times daily.   No facility-administered medications prior to visit.    Review of  Systems  Skin:  Positive for rash.    {Labs  Heme  Chem  Endocrine  Serology  Results Review (optional):23779}   Objective    BP 130/83   Pulse 71   Temp 98.3 F (36.8 C) (Oral)   Wt 209 lb (94.8 kg)   LMP 12/21/2022 (Approximate)   SpO2 98%   BMI 37.62 kg/m  {Show previous vital signs (optional):23777}  Physical Exam Vitals reviewed.  Constitutional:      General: She is awake.     Appearance: Normal appearance. She is well-developed and well-groomed.  HENT:     Head: Normocephalic and atraumatic.  Pulmonary:     Effort: Pulmonary effort is normal.  Skin:    General: Skin is warm and dry.     Findings: Erythema and rash present. Rash is macular. Rash is not scaling or vesicular.     Comments: Macular rash with overlying abrasions on forearm, hip and trunk   Neurological:     Mental Status: She is alert.  Psychiatric:        Behavior: Behavior is cooperative.      No results found for any visits on 01/08/23.  Assessment & Plan      No follow-ups on file.       Problem List Items Addressed This Visit   None Visit Diagnoses     Rash and nonspecific skin eruption    -  Primary   Relevant Medications   permethrin (ELIMITE) 5 % cream   Other Relevant Orders   Ambulatory referral to Dermatology   Scabies       Relevant Medications   permethrin (ELIMITE) 5 % cream        No follow-ups on file.   I, Aluna Whiston E Ashrith Sagan, PA-C, have reviewed all documentation for this visit. The documentation on 01/08/23 for the exam, diagnosis, procedures, and orders are all accurate and complete.   Jacquelin Hawking, MHS, PA-C [facility]

## 2023-01-09 ENCOUNTER — Encounter: Payer: Self-pay | Admitting: Physician Assistant

## 2023-01-10 ENCOUNTER — Ambulatory Visit: Payer: BC Managed Care – PPO | Admitting: Nurse Practitioner

## 2023-01-10 NOTE — Telephone Encounter (Signed)
Copied from CRM [id number]. Topic: Referral - Request for Referral >> Jan 10, 2023 11:29 AM Marlow Baars wrote: Has patient seen PCP for this complaint? Yes.   Referral for which specialty: Dermatology Preferred provider/office: A dermatologist that accepts her insurance and that is hopefully not too far away Reason for referral: Skin issues  She states her originally requested provider does not have any available appts until next April and she would like to be seen sooner. Please assist patient further

## 2023-01-15 ENCOUNTER — Ambulatory Visit: Payer: BC Managed Care – PPO | Admitting: Family Medicine

## 2023-01-18 NOTE — Telephone Encounter (Signed)
Patient states she sent a my chart message on 7/2 and called on 7/9 regarding the status of dermatology referral. Patient would like the referral to go to [facility] dermatology skin and cancer [phone]. Patient would like a call back as soon as possible regarding the status. Please document when completed.

## 2023-02-21 ENCOUNTER — Ambulatory Visit: Payer: BC Managed Care – PPO | Admitting: Nurse Practitioner

## 2023-02-23 ENCOUNTER — Encounter: Payer: Self-pay | Admitting: Nurse Practitioner

## 2023-02-23 ENCOUNTER — Ambulatory Visit: Payer: BC Managed Care – PPO | Admitting: Nurse Practitioner

## 2023-02-23 VITALS — BP 128/82 | HR 80 | Temp 98.4°F | Wt 208.0 lb

## 2023-02-23 DIAGNOSIS — B9689 Other specified bacterial agents as the cause of diseases classified elsewhere: Secondary | ICD-10-CM | POA: Diagnosis not present

## 2023-02-23 DIAGNOSIS — N760 Acute vaginitis: Secondary | ICD-10-CM

## 2023-02-23 DIAGNOSIS — K219 Gastro-esophageal reflux disease without esophagitis: Secondary | ICD-10-CM | POA: Diagnosis not present

## 2023-02-23 LAB — URINALYSIS, ROUTINE W REFLEX MICROSCOPIC
Bilirubin, UA: NEGATIVE
Glucose, UA: NEGATIVE
Ketones, UA: NEGATIVE
Leukocytes,UA: NEGATIVE
Nitrite, UA: NEGATIVE
Protein,UA: NEGATIVE
Specific Gravity, UA: 1.02 (ref 1.005–1.030)
Urobilinogen, Ur: 0.2 mg/dL (ref 0.2–1.0)
pH, UA: 7 (ref 5.0–7.5)

## 2023-02-23 LAB — WET PREP FOR TRICH, YEAST, CLUE
Clue Cell Exam: POSITIVE — AB
Trichomonas Exam: NEGATIVE
Yeast Exam: NEGATIVE

## 2023-02-23 LAB — MICROSCOPIC EXAMINATION
Bacteria, UA: NONE SEEN
WBC, UA: NONE SEEN /hpf (ref 0–5)

## 2023-02-23 MED ORDER — METRONIDAZOLE 500 MG PO TABS: 500.0000 mg | ORAL_TABLET | Freq: Two times a day (BID) | ORAL | 0 refills | Status: AC

## 2023-02-23 MED ORDER — TRIAMCINOLONE ACETONIDE 0.1 % EX CREA: 1.0000 | TOPICAL_CREAM | Freq: Two times a day (BID) | CUTANEOUS | 1 refills | Status: DC

## 2023-02-23 NOTE — Patient Instructions (Signed)
Pepcid 20 MG twice a day.   Food Choices for Gastroesophageal Reflux Disease, Adult When you have gastroesophageal reflux disease (GERD), the foods you eat and your eating habits are very important. Choosing the right foods can help ease your discomfort. Think about working with a food expert (dietitian) to help you make good choices. What are tips for following this plan? Reading food labels Look for foods that are low in saturated fat. Foods that may help with your symptoms include: Foods that have less than 5% of daily value (DV) of fat. Foods that have 0 grams of trans fat. Cooking Do not fry your food. Cook your food by baking, steaming, grilling, or broiling. These are all methods that do not need a lot of fat for cooking. To add flavor, try to use herbs that are low in spice and acidity. Meal planning  Choose healthy foods that are low in fat, such as: Fruits and vegetables. Whole grains. Low-fat dairy products. Lean meats, fish, and poultry. Eat small meals often instead of eating 3 large meals each day. Eat your meals slowly in a place where you are relaxed. Avoid bending over or lying down until 2-3 hours after eating. Limit high-fat foods such as fatty meats or fried foods. Limit your intake of fatty foods, such as oils, butter, and shortening. Avoid the following as told by your doctor: Foods that cause symptoms. These may be different for different people. Keep a food diary to keep track of foods that cause symptoms. Alcohol. Drinking a lot of liquid with meals. Eating meals during the 2-3 hours before bed. Lifestyle Stay at a healthy weight. Ask your doctor what weight is healthy for you. If you need to lose weight, work with your doctor to do so safely. Exercise for at least 30 minutes on 5 or more days each week, or as told by your doctor. Wear loose-fitting clothes. Do not smoke or use any products that contain nicotine or tobacco. If you need help quitting, ask your  doctor. Sleep with the head of your bed higher than your feet. Use a wedge under the mattress or blocks under the bed frame to raise the head of the bed. Chew sugar-free gum after meals. What foods should eat?  Eat a healthy, well-balanced diet of fruits, vegetables, whole grains, low-fat dairy products, lean meats, fish, and poultry. Each person is different. Foods that may cause symptoms in one person may not cause any symptoms in another person. Work with your doctor to find foods that are safe for you. The items listed above may not be a complete list of what you can eat and drink. Contact a food expert for more options. What foods should I avoid? Limiting some of these foods may help in managing the symptoms of GERD. Everyone is different. Talk with a food expert or your doctor to help you find the exact foods to avoid, if any. Fruits Any fruits prepared with added fat. Any fruits that cause symptoms. For some people, this may include citrus fruits, such as oranges, grapefruit, pineapple, and lemons. Vegetables Deep-fried vegetables. [place] fries. Any vegetables prepared with added fat. Any vegetables that cause symptoms. For some people, this may include tomatoes and tomato products, chili peppers, onions and garlic, and horseradish. Grains Pastries or quick breads with added fat. Meats and other proteins High-fat meats, such as fatty beef or pork, hot dogs, ribs, ham, sausage, salami, and bacon. Fried meat or protein, including fried fish and fried chicken. Nuts  and nut butters, in large amounts. Dairy Whole milk and chocolate milk. Sour cream. Cream. Ice cream. Cream cheese. Milkshakes. Fats and oils Butter. Margarine. Shortening. Ghee. Beverages Coffee and tea, with or without caffeine. Carbonated beverages. Sodas. Energy drinks. Fruit juice made with acidic fruits, such as orange or grapefruit. Tomato juice. Alcoholic drinks. Sweets and desserts Chocolate and cocoa.  Donuts. Seasonings and condiments Pepper. Peppermint and spearmint. Added salt. Any condiments, herbs, or seasonings that cause symptoms. For some people, this may include curry, hot sauce, or vinegar-based salad dressings. The items listed above may not be a complete list of what you should not eat and drink. Contact a food expert for more options. Questions to ask your doctor Diet and lifestyle changes are often the first steps that are taken to manage symptoms of GERD. If diet and lifestyle changes do not help, talk with your doctor about taking medicines. Where to find more information [employer] G[employer]intestinal [employer]: aboutgerd.org Summary When you have GERD, food and lifestyle choices are very important in easing your symptoms. Eat small meals often instead of 3 large meals a day. Eat your meals slowly and in a place where you are relaxed. Avoid bending over or lying down until 2-3 hours after eating. Limit high-fat foods such as fatty meats or fried foods. This information is not intended to replace advice given to you by your health care provider. Make sure you discuss any questions you have with your health care provider. Document Revised: 01/05/2020 Document Reviewed: 01/05/2020 Elsevier Patient Education  2024 [employer].

## 2023-02-23 NOTE — Assessment & Plan Note (Signed)
Acute with clue cells noted on wet prep, will start Flagyl 500 MG BID for 7 days.  She has had recurrent infections in past.  Recommend continue probiotic.

## 2023-02-23 NOTE — Progress Notes (Signed)
BP 128/82 (BP Location: Left Arm, Patient Position: Sitting, Cuff Size: Large)   Pulse 80   Temp 98.4 F (36.9 C) (Oral)   Wt 208 lb (94.3 kg)   SpO2 98%   BMI 37.44 kg/m    Subjective:    Patient ID: Lynn Vargas, female    DOB: Mar 09, 1981, 42 y.o.   MRN: [id number]  HPI: Lynn Vargas is a 42 y.o. female  Chief Complaint  Patient presents with   Vaginal Odor    Pt states she has recently finished a course of clindamycin for a MRSA infection, states since then she has been having bubbling in her stomach, vaginal odor, and feeling like she has something stuck in her throat all the time.    VAGINAL DISCHARGE Recently completed course of Clindamycin for a MRSA infection.  Since that time has had a vaginal odor + cramping in side and issues with stomach -- bubbling and feeling like something stuck in throat.  This started end of July.  Needs steroid cream script sent for itching to skin, has been using her family members.  Duration: weeks Discharge description: white  Pruritus: yes Dysuria: yes Malodorous: yes Urinary frequency:  at first, but this improved Fevers: no Abdominal pain: yes  Sexual activity: not sexually active History of sexually transmitted diseases:  herpes present Recent antibiotic use: yes Context: stable  Treatments attempted:  probiotic, TUMS    Relevant past medical, surgical, family and social history reviewed and updated as indicated. Interim medical history since our last visit reviewed. Allergies and medications reviewed and updated.  Review of Systems  Constitutional:  Negative for activity change, appetite change, diaphoresis, fatigue and fever.  Respiratory:  Negative for cough, chest tightness and shortness of breath.   Cardiovascular:  Negative for chest pain, palpitations and leg swelling.  Gastrointestinal:  Positive for abdominal pain (cramping in side) and nausea. Negative for abdominal distention, constipation, diarrhea and  vomiting.  Genitourinary:  Positive for vaginal discharge. Negative for vaginal bleeding and vaginal pain.  Neurological: Negative.   Psychiatric/Behavioral: Negative.     Per HPI unless specifically indicated above     Objective:    BP 128/82 (BP Location: Left Arm, Patient Position: Sitting, Cuff Size: Large)   Pulse 80   Temp 98.4 F (36.9 C) (Oral)   Wt 208 lb (94.3 kg)   SpO2 98%   BMI 37.44 kg/m   Wt Readings from Last 3 Encounters:  02/23/23 208 lb (94.3 kg)  01/08/23 209 lb (94.8 kg)  12/28/22 209 lb (94.8 kg)    Physical Exam Vitals and nursing note reviewed.  Constitutional:      General: She is awake. She is not in acute distress.    Appearance: She is well-developed and well-groomed. She is obese. She is not ill-appearing or toxic-appearing.  HENT:     Head: Normocephalic.     Right Ear: Hearing and external ear normal.     Left Ear: Hearing and external ear normal.  Eyes:     General: Lids are normal.        Right eye: No discharge.        Left eye: No discharge.     Conjunctiva/sclera: Conjunctivae normal.     Pupils: Pupils are equal, round, and reactive to light.  Neck:     Thyroid: No thyromegaly.     Vascular: No carotid bruit.  Cardiovascular:     Rate and Rhythm: Normal rate and regular rhythm.  Heart sounds: Normal heart sounds. No murmur heard.    No gallop.  Pulmonary:     Effort: Pulmonary effort is normal. No accessory muscle usage or respiratory distress.     Breath sounds: Normal breath sounds.  Abdominal:     General: Bowel sounds are normal. There is no distension.     Palpations: Abdomen is soft.     Tenderness: There is no abdominal tenderness.  Musculoskeletal:     Cervical back: Normal range of motion and neck supple.     Right lower leg: No edema.     Left lower leg: No edema.  Lymphadenopathy:     Cervical: No cervical adenopathy.  Skin:    General: Skin is warm and dry.  Neurological:     Mental Status: She is alert  and oriented to person, place, and time.     Deep Tendon Reflexes: Reflexes are normal and symmetric.     Reflex Scores:      Brachioradialis reflexes are 2+ on the right side and 2+ on the left side.      Patellar reflexes are 2+ on the right side and 2+ on the left side. Psychiatric:        Attention and Perception: Attention normal.        Mood and Affect: Mood normal.        Speech: Speech normal.        Behavior: Behavior normal. Behavior is cooperative.        Thought Content: Thought content normal.    Results for orders placed or performed during the hospital encounter of 12/24/22  CBC with Differential  Result Value Ref Range   WBC 8.3 4.0 - 10.5 K/uL   RBC 4.05 3.87 - 5.11 MIL/uL   Hemoglobin 11.7 (L) 12.0 - 15.0 g/dL   HCT 16.1 (L) 09.6 - 04.5 %   MCV 85.4 80.0 - 100.0 fL   MCH 28.9 26.0 - 34.0 pg   MCHC 33.8 30.0 - 36.0 g/dL   RDW 40.9 81.1 - 91.4 %   Platelets 256 150 - 400 K/uL   nRBC 0.0 0.0 - 0.2 %   Neutrophils Relative % 64 %   Neutro Abs 5.3 1.7 - 7.7 K/uL   Lymphocytes Relative 22 %   Lymphs Abs 1.9 0.7 - 4.0 K/uL   Monocytes Relative 9 %   Monocytes Absolute 0.8 0.1 - 1.0 K/uL   Eosinophils Relative 4 %   Eosinophils Absolute 0.3 0.0 - 0.5 K/uL   Basophils Relative 1 %   Basophils Absolute 0.1 0.0 - 0.1 K/uL   Immature Granulocytes 0 %   Abs Immature Granulocytes 0.03 0.00 - 0.07 K/uL  Comprehensive metabolic panel  Result Value Ref Range   Sodium 134 (L) 135 - 145 mmol/L   Potassium 3.5 3.5 - 5.1 mmol/L   Chloride 103 98 - 111 mmol/L   CO2 25 22 - 32 mmol/L   Glucose, Bld 122 (H) 70 - 99 mg/dL   BUN 14 6 - 20 mg/dL   Creatinine, Ser 7.82 0.44 - 1.00 mg/dL   Calcium 8.8 (L) 8.9 - 10.3 mg/dL   Total Protein 7.4 6.5 - 8.1 g/dL   Albumin 3.7 3.5 - 5.0 g/dL   AST 17 15 - 41 U/L   ALT 20 0 - 44 U/L   Alkaline Phosphatase 46 38 - 126 U/L   Total Bilirubin 0.4 0.3 - 1.2 mg/dL   GFR, Estimated >95 >62 mL/min   Anion gap  6 5 - 15  I-Stat beta hCG  blood, ED ([facility], [facility], [facility] only)  Result Value Ref Range   I-stat hCG, quantitative 14.6 (H) <5 mIU/mL   Comment 3              Assessment & Plan:   Problem List Items Addressed This Visit       Digestive   Acid reflux    Started after taking Clindamycin.  Recommend she start taking Pepcid 20 MG BID for 10 to 14 days and monitor diet.  More bland food.  If ongoing return to office.        Genitourinary   Bacterial vaginosis - Primary    Acute with clue cells noted on wet prep, will start Flagyl 500 MG BID for 7 days.  She has had recurrent infections in past.  Recommend continue probiotic.      Relevant Medications   metroNIDAZOLE (FLAGYL) 500 MG tablet   Other Relevant Orders   WET PREP FOR TRICH, YEAST, CLUE   Urinalysis, Routine w reflex microscopic     Follow up plan: Return if symptoms worsen or fail to improve.

## 2023-02-23 NOTE — Assessment & Plan Note (Signed)
Started after taking Clindamycin.  Recommend she start taking Pepcid 20 MG BID for 10 to 14 days and monitor diet.  More bland food.  If ongoing return to office.

## 2023-04-03 ENCOUNTER — Encounter (HOSPITAL_COMMUNITY): Payer: Self-pay

## 2023-04-03 ENCOUNTER — Emergency Department (HOSPITAL_COMMUNITY)
Admission: EM | Admit: 2023-04-03 | Discharge: 2023-04-03 | Disposition: A | Discharge status: Home / Self Care | Payer: BC Managed Care – PPO | Attending: Emergency Medicine | Admitting: Emergency Medicine

## 2023-04-03 ENCOUNTER — Other Ambulatory Visit: Payer: Self-pay

## 2023-04-03 ENCOUNTER — Emergency Department (HOSPITAL_COMMUNITY): Payer: BC Managed Care – PPO

## 2023-04-03 DIAGNOSIS — Z8616 Personal history of COVID-19: Secondary | ICD-10-CM | POA: Diagnosis not present

## 2023-04-03 DIAGNOSIS — Z91040 Latex allergy status: Secondary | ICD-10-CM | POA: Insufficient documentation

## 2023-04-03 DIAGNOSIS — Z20822 Contact with and (suspected) exposure to covid-19: Secondary | ICD-10-CM | POA: Insufficient documentation

## 2023-04-03 DIAGNOSIS — R079 Chest pain, unspecified: Secondary | ICD-10-CM | POA: Insufficient documentation

## 2023-04-03 DIAGNOSIS — R5383 Other fatigue: Secondary | ICD-10-CM | POA: Diagnosis not present

## 2023-04-03 DIAGNOSIS — R070 Pain in throat: Secondary | ICD-10-CM | POA: Insufficient documentation

## 2023-04-03 LAB — BASIC METABOLIC PANEL
Anion gap: 6 (ref 5–15)
BUN: 16 mg/dL (ref 6–20)
CO2: 25 mmol/L (ref 22–32)
Calcium: 8.9 mg/dL (ref 8.9–10.3)
Chloride: 105 mmol/L (ref 98–111)
Creatinine, Ser: 0.61 mg/dL (ref 0.44–1.00)
GFR, Estimated: >60 mL/min (ref >60)
Glucose, Bld: 99 mg/dL (ref 70–99)
Potassium: 3.7 mmol/L (ref 3.5–5.1)
Sodium: 136 mmol/L (ref 135–145)

## 2023-04-03 LAB — CBC
HCT: 35.8 % — ABNORMAL LOW (ref 36.0–46.0)
Hemoglobin: 12.2 g/dL (ref 12.0–15.0)
MCH: 28.7 pg (ref 26.0–34.0)
MCHC: 34.1 g/dL (ref 30.0–36.0)
MCV: 84.2 fL (ref 80.0–100.0)
Platelets: 237 10*3/uL (ref 150–400)
RBC: 4.25 MIL/uL (ref 3.87–5.11)
RDW: 13.4 % (ref 11.5–15.5)
WBC: 6.1 10*3/uL (ref 4.0–10.5)
nRBC: 0 % (ref 0.0–0.2)

## 2023-04-03 LAB — TSH: TSH: 2.257 u[IU]/mL (ref 0.350–4.500)

## 2023-04-03 LAB — RESP PANEL BY RT-PCR (RSV, FLU A&B, COVID)  RVPGX2
Influenza A by PCR: NEGATIVE
Influenza B by PCR: NEGATIVE
Resp Syncytial Virus by PCR: NEGATIVE
SARS Coronavirus 2 by RT PCR: NEGATIVE

## 2023-04-03 LAB — HCG, SERUM, QUALITATIVE: Preg, Serum: NEGATIVE

## 2023-04-03 LAB — TROPONIN I (HIGH SENSITIVITY): Troponin I (High Sensitivity): 2 ng/L (ref <18)

## 2023-04-03 LAB — GROUP A STREP BY PCR: Group A Strep by PCR: NOT DETECTED

## 2023-04-03 MED ORDER — FAMOTIDINE 40 MG PO TABS: 40.0000 mg | ORAL_TABLET | Freq: Every day | ORAL | 0 refills | Status: DC

## 2023-04-03 NOTE — ED Provider Notes (Signed)
[facility] AT [facility] Provider Note   CSN: [id number] Arrival date & time: 04/03/23  0830     History  Chief Complaint  Patient presents with   Chest Pain    Lynn Vargas is a 42 y.o. female.  This is a 42 year old female who is here today for multiple complaints.  Patient states that "I do not feel right."  She says has been ongoing for several weeks.  She says that she has had headaches, she has woken up many times in the middle of the night feeling like she cannot breathe, she intermittently has neck pain, she has had a sore throat for a few weeks, she recently had contact with someone who had COVID.  She has had a sleep study outpatient.  Her PCP recommended that she take medicine for GERD which she did not start taking.     Chest Pain      Home Medications Prior to Admission medications   Medication Sig Start Date End Date Taking? Authorizing Provider  famotidine (PEPCID) 40 MG tablet Take 1 tablet (40 mg total) by mouth daily. 04/03/23 05/03/23 Yes Arletha Pili, DO  Biotin 5000 MCG CAPS Take 1 capsule by mouth daily.    [provider]  triamcinolone cream (KENALOG) 0.1 % Apply 1 Application topically 2 (two) times daily. 02/23/23   Cannady, Corrie Dandy T, NP  VITAMIN D PO Take 5,000 Units by mouth daily.    [provider]      Allergies    Latex    Review of Systems   Review of Systems  Cardiovascular:  Positive for chest pain.    Physical Exam Updated Vital Signs BP (!) 153/118   Pulse 76   Temp 98.8 F (37.1 C) (Oral)   Resp 18   Ht 5' 3.5" (1.613 m)   Wt 95 kg   LMP 03/27/2023 (Approximate) Comment: patient was shielded 04/03/23, patient states no chance of pregnancy  SpO2 100%   BMI 36.52 kg/m  Physical Exam Vitals reviewed.  HENT:     Head: Normocephalic and atraumatic.     Mouth/Throat:     Mouth: Mucous membranes are moist.     Pharynx: No pharyngeal swelling or posterior  oropharyngeal erythema.  Cardiovascular:     Rate and Rhythm: Normal rate and regular rhythm.     Heart sounds: Normal heart sounds.  Pulmonary:     Effort: Pulmonary effort is normal.  Musculoskeletal:     Cervical back: Normal range of motion and neck supple.  Neurological:     Mental Status: She is alert.     ED Results / Procedures / Treatments   Labs (all labs ordered are listed, but only abnormal results are displayed) Labs Reviewed  CBC - Abnormal; Notable for the following components:      Result Value   HCT 35.8 (*)    All other components within normal limits  GROUP A STREP BY PCR  RESP PANEL BY RT-PCR (RSV, FLU A&B, COVID)  [id number]  BASIC METABOLIC PANEL  HCG, SERUM, QUALITATIVE  TSH  TROPONIN I (HIGH SENSITIVITY)  TROPONIN I (HIGH SENSITIVITY)    EKG EKG Interpretation Date/Time:  Tuesday April 03 2023 08:55:21 EDT Ventricular Rate:  73 PR Interval:  160 QRS Duration:  90 QT Interval:  394 QTC Calculation: 435 R Axis:   56  Text Interpretation: Sinus rhythm Borderline T abnormalities, anterior leads Confirmed by Anders Simmonds [phone]) on 04/03/2023 8:58:00 AM  Radiology No results found.  Procedures Procedures    Medications Ordered in ED Medications - No data to display  ED Course/ Medical Decision Making/ A&P                                 Medical Decision Making 42 year old female here today for multiple complaints.  Plan-with the patient's broad complaints, will obtain some basic screening labs on the patient.  Her throat has no erythema, or swelling.  There does not appear to be a mass.  She has had some fatigue, and her symptoms do sound like there is a component of sleep apnea but her sleep study was negative.  Will check a thyroid.  COVID swab ordered.  Patient with no neurological deficits, overall looks quite well.  Low suspicion for acute emergent process.  Reassessment-patient's thyroid function normal, no leukocytosis, no  anemia, normal kidney function, electrolytes normal.  Strep test negative.  My dependent review the patient's chest x-ray shows no pneumonia.  Will plan to discharge patient, have her follow-up with her PCP.  Amount and/or Complexity of Data Reviewed Labs: ordered. Radiology: ordered.           Final Clinical Impression(s) / ED Diagnoses Final diagnoses:  Throat pain    Rx / DC Orders ED Discharge Orders          Ordered    famotidine (PEPCID) 40 MG tablet  Daily        04/03/23 1051              Anders Simmonds T, DO 04/03/23 1051

## 2023-04-03 NOTE — ED Notes (Signed)
Results discussed with patient by EDP provider. All questions answered. Discharge instructions, follow up information, and prescriptions reviewed with patient. Patient verbalized understanding. No acute distress noted. Escorted to ED lobby for dispo. Stable condition.

## 2023-04-03 NOTE — ED Triage Notes (Signed)
Patient has had a sore throat since August. Then the pain moves down into her chest causing pressure and body aches. Came in contact with someone with covid a week ago.

## 2023-04-03 NOTE — Discharge Instructions (Addendum)
While you are in the emergency department, you had labs done.  These labs did not show any signs of infection, problems with your red blood cells, or your thyroid.  Your heart enzyme test and your EKG were normal.  Your chest x-ray was normal.  Pregnancy test was negative.  At this time, I do not have a clear cause was causing your symptoms, but it does not appear to be an emergency at this time.  I would recommend that you follow-up with your primary care doctor.  Regarding the throat pain, sometimes medications like famotidine can be helpful with this.  Make an appointment with your primary care doctor within 1 to 2 weeks.

## 2023-04-11 ENCOUNTER — Ambulatory Visit: Payer: BC Managed Care – PPO | Admitting: Nurse Practitioner

## 2023-07-13 ENCOUNTER — Ambulatory Visit: Payer: BC Managed Care – PPO | Admitting: Nurse Practitioner

## 2023-07-25 ENCOUNTER — Other Ambulatory Visit: Payer: Self-pay | Admitting: Nurse Practitioner

## 2023-07-25 DIAGNOSIS — Z1231 Encounter for screening mammogram for malignant neoplasm of breast: Secondary | ICD-10-CM

## 2023-09-19 ENCOUNTER — Ambulatory Visit
Admission: RE | Admit: 2023-09-19 | Discharge: 2023-09-19 | Disposition: A | Discharge status: Home / Self Care | Payer: BC Managed Care – PPO | Source: Ambulatory Visit | Attending: Nurse Practitioner | Admitting: Nurse Practitioner

## 2023-09-19 DIAGNOSIS — Z1231 Encounter for screening mammogram for malignant neoplasm of breast: Secondary | ICD-10-CM | POA: Insufficient documentation

## 2023-09-24 ENCOUNTER — Emergency Department (HOSPITAL_COMMUNITY)

## 2023-09-24 ENCOUNTER — Ambulatory Visit (HOSPITAL_COMMUNITY)
Admission: EM | Admit: 2023-09-24 | Discharge: 2023-09-24 | Disposition: A | Discharge status: Other Acute Inpatient Hospital

## 2023-09-24 ENCOUNTER — Encounter (HOSPITAL_COMMUNITY): Payer: Self-pay

## 2023-09-24 ENCOUNTER — Emergency Department (HOSPITAL_COMMUNITY)
Admission: EM | Admit: 2023-09-24 | Discharge: 2023-09-24 | Disposition: A | Discharge status: Home / Self Care | Attending: Student | Admitting: Student

## 2023-09-24 ENCOUNTER — Other Ambulatory Visit: Payer: Self-pay

## 2023-09-24 ENCOUNTER — Encounter (HOSPITAL_COMMUNITY): Payer: Self-pay | Admitting: *Deleted

## 2023-09-24 DIAGNOSIS — Z91040 Latex allergy status: Secondary | ICD-10-CM | POA: Diagnosis not present

## 2023-09-24 DIAGNOSIS — H538 Other visual disturbances: Secondary | ICD-10-CM

## 2023-09-24 DIAGNOSIS — R4781 Slurred speech: Secondary | ICD-10-CM

## 2023-09-24 DIAGNOSIS — R2681 Unsteadiness on feet: Secondary | ICD-10-CM | POA: Diagnosis not present

## 2023-09-24 DIAGNOSIS — I10 Essential (primary) hypertension: Secondary | ICD-10-CM | POA: Insufficient documentation

## 2023-09-24 DIAGNOSIS — M79622 Pain in left upper arm: Secondary | ICD-10-CM | POA: Diagnosis not present

## 2023-09-24 DIAGNOSIS — R200 Anesthesia of skin: Secondary | ICD-10-CM | POA: Insufficient documentation

## 2023-09-24 DIAGNOSIS — R42 Dizziness and giddiness: Secondary | ICD-10-CM | POA: Insufficient documentation

## 2023-09-24 DIAGNOSIS — R531 Weakness: Secondary | ICD-10-CM | POA: Insufficient documentation

## 2023-09-24 LAB — COMPREHENSIVE METABOLIC PANEL
ALT: 22 U/L (ref 0–44)
AST: 21 U/L (ref 15–41)
Albumin: 3.7 g/dL (ref 3.5–5.0)
Alkaline Phosphatase: 39 U/L (ref 38–126)
Anion gap: 8 (ref 5–15)
BUN: 14 mg/dL (ref 6–20)
CO2: 25 mmol/L (ref 22–32)
Calcium: 9.4 mg/dL (ref 8.9–10.3)
Chloride: 102 mmol/L (ref 98–111)
Creatinine, Ser: 0.76 mg/dL (ref 0.44–1.00)
GFR, Estimated: >60 mL/min (ref >60)
Glucose, Bld: 125 mg/dL — ABNORMAL HIGH (ref 70–99)
Potassium: 3.6 mmol/L (ref 3.5–5.1)
Sodium: 135 mmol/L (ref 135–145)
Total Bilirubin: 0.4 mg/dL (ref 0.0–1.2)
Total Protein: 7 g/dL (ref 6.5–8.1)

## 2023-09-24 LAB — DIFFERENTIAL
Abs Immature Granulocytes: 0.02 10*3/uL (ref 0.00–0.07)
Basophils Absolute: 0.1 10*3/uL (ref 0.0–0.1)
Basophils Relative: 1 %
Eosinophils Absolute: 0.1 10*3/uL (ref 0.0–0.5)
Eosinophils Relative: 1 %
Immature Granulocytes: 0 %
Lymphocytes Relative: 21 %
Lymphs Abs: 1.8 10*3/uL (ref 0.7–4.0)
Monocytes Absolute: 0.6 10*3/uL (ref 0.1–1.0)
Monocytes Relative: 7 %
Neutro Abs: 6.1 10*3/uL (ref 1.7–7.7)
Neutrophils Relative %: 70 %

## 2023-09-24 LAB — URINALYSIS, ROUTINE W REFLEX MICROSCOPIC
Bilirubin Urine: NEGATIVE
Glucose, UA: NEGATIVE mg/dL
Hgb urine dipstick: NEGATIVE
Ketones, ur: NEGATIVE mg/dL
Leukocytes,Ua: NEGATIVE
Nitrite: NEGATIVE
Protein, ur: NEGATIVE mg/dL
Specific Gravity, Urine: 1.018 (ref 1.005–1.030)
pH: 7 (ref 5.0–8.0)

## 2023-09-24 LAB — I-STAT CHEM 8, ED
BUN: 15 mg/dL (ref 6–20)
Calcium, Ion: 1.2 mmol/L (ref 1.15–1.40)
Chloride: 103 mmol/L (ref 98–111)
Creatinine, Ser: 0.8 mg/dL (ref 0.44–1.00)
Glucose, Bld: 119 mg/dL — ABNORMAL HIGH (ref 70–99)
HCT: 36 % (ref 36.0–46.0)
Hemoglobin: 12.2 g/dL (ref 12.0–15.0)
Potassium: 3.7 mmol/L (ref 3.5–5.1)
Sodium: 138 mmol/L (ref 135–145)
TCO2: 24 mmol/L (ref 22–32)

## 2023-09-24 LAB — RAPID URINE DRUG SCREEN, HOSP PERFORMED
Amphetamines: NOT DETECTED
Barbiturates: NOT DETECTED
Benzodiazepines: NOT DETECTED
Cocaine: NOT DETECTED
Opiates: NOT DETECTED
Tetrahydrocannabinol: NOT DETECTED

## 2023-09-24 LAB — PROTIME-INR
INR: 1 (ref 0.8–1.2)
Prothrombin Time: 13.5 s (ref 11.4–15.2)

## 2023-09-24 LAB — CBC
HCT: 35.7 % — ABNORMAL LOW (ref 36.0–46.0)
Hemoglobin: 12.2 g/dL (ref 12.0–15.0)
MCH: 29.3 pg (ref 26.0–34.0)
MCHC: 34.2 g/dL (ref 30.0–36.0)
MCV: 85.8 fL (ref 80.0–100.0)
Platelets: 252 10*3/uL (ref 150–400)
RBC: 4.16 MIL/uL (ref 3.87–5.11)
RDW: 12.9 % (ref 11.5–15.5)
WBC: 8.7 10*3/uL (ref 4.0–10.5)
nRBC: 0 % (ref 0.0–0.2)

## 2023-09-24 LAB — CBG MONITORING, ED: Glucose-Capillary: 135 mg/dL — ABNORMAL HIGH (ref 70–99)

## 2023-09-24 LAB — ETHANOL: Alcohol, Ethyl (B): <10 mg/dL (ref <10)

## 2023-09-24 LAB — APTT: aPTT: 28 s (ref 24–36)

## 2023-09-24 MED ORDER — GADOBUTROL 1 MMOL/ML IV SOLN
10.0000 mL | Freq: Once | INTRAVENOUS | Status: AC | PRN
Start: 2023-09-24 — End: 2023-09-24
  Administered 2023-09-24: 10 mL via INTRAVENOUS

## 2023-09-24 MED ORDER — IOHEXOL 350 MG/ML SOLN
75.0000 mL | Freq: Once | INTRAVENOUS | Status: AC | PRN
  Administered 2023-09-24: 75 mL via INTRAVENOUS

## 2023-09-24 MED ORDER — MECLIZINE HCL 50 MG PO TABS: 50.0000 mg | ORAL_TABLET | Freq: Three times a day (TID) | ORAL | 0 refills | Status: DC | PRN

## 2023-09-24 NOTE — ED Triage Notes (Signed)
 Pt presents to ED via [facility] from urgent care. Pt was driving [employer] vehicle when she became dizzy and feeling wobbly all over. Pt reports some blurry vision. Alert and oriented x4. LNW [id number]. Pt reports some pain in left bicep but reports that it feels like a pulled muscle.

## 2023-09-24 NOTE — ED Notes (Signed)
 ED CN called with [facility] Stroke report. [facility] present to transport PT.

## 2023-09-24 NOTE — ED Provider Notes (Signed)
 [place] EMERGENCY DEPARTMENT AT [facility] Provider Note   CSN: [id number] Arrival date & time: 09/24/23  1807  An emergency department physician performed an initial assessment on this suspected stroke patient at 75.  History Chief Complaint  Patient presents with   Code Stroke         HPI Lynn Vargas is a 43 y.o. female presenting with sudden onset left arm numbness and dizziness that started while she was driving a few hours prior to arrival. She states that she was turning her work truck well when she felt a spasming in her left arm got immediately dizzy and felt like she was going to syncopized.  Endorsed pain in the left bicep as well during the movement. Denies fevers chills nausea vomiting syncope shortness of breath.  Patient's recorded medical, surgical, social, medication list and allergies were reviewed in the Snapshot window as part of the initial history.   Review of Systems   Review of Systems  Constitutional:  Negative for chills and fever.  HENT:  Negative for ear pain and sore throat.   Eyes:  Negative for pain and visual disturbance.  Respiratory:  Negative for cough and shortness of breath.   Cardiovascular:  Negative for chest pain and palpitations.  Gastrointestinal:  Negative for abdominal pain and vomiting.  Genitourinary:  Negative for dysuria and hematuria.  Musculoskeletal:  Negative for arthralgias and back pain.  Skin:  Negative for color change and rash.  Neurological:  Positive for dizziness, weakness and numbness. Negative for seizures and syncope.  All other systems reviewed and are negative.   Physical Exam Updated Vital Signs BP (!) 159/110   Pulse 73   Temp 98.5 F (36.9 C) (Oral)   Resp 18   Wt 101 kg   LMP 09/10/2023   SpO2 100%   BMI 38.82 kg/m  Physical Exam Vitals and nursing note reviewed.  Constitutional:      General: She is not in acute distress.    Appearance: She is well-developed.  HENT:      Head: Normocephalic and atraumatic.  Eyes:     Conjunctiva/sclera: Conjunctivae normal.  Cardiovascular:     Rate and Rhythm: Normal rate and regular rhythm.     Heart sounds: No murmur heard. Pulmonary:     Effort: Pulmonary effort is normal. No respiratory distress.     Breath sounds: Normal breath sounds.  Abdominal:     General: There is no distension.     Palpations: Abdomen is soft.     Tenderness: There is no abdominal tenderness. There is no right CVA tenderness or left CVA tenderness.  Musculoskeletal:        General: No swelling or tenderness. Normal range of motion.     Cervical back: Neck supple.  Skin:    General: Skin is warm and dry.  Neurological:     General: No focal deficit present.     Mental Status: She is alert and oriented to person, place, and time. Mental status is at baseline.     Cranial Nerves: No cranial nerve deficit.      ED Course/ Medical Decision Making/ A&P    Procedures Procedures   Medications Ordered in ED Medications  iohexol (OMNIPAQUE) 350 MG/ML injection 75 mL (75 mLs Intravenous Contrast Given 09/24/23 1831)  gadobutrol (GADAVIST) 1 MMOL/ML injection 10 mL (10 mLs Intravenous Contrast Given 09/24/23 2025)   Medical Decision Making:    [name] is a 43 y.o. female  who presented to the ED today with left arm weakness and dizziness.  Due to these symptoms, nursing activated a CODE STROKE per hospital protocol.   Patient placed on continuous vitals and telemetry monitoring while in ED which was reviewed periodically.   On my initial exam, the pt was in no acute distress, glucose was WNL and deficits include dizziness.  Deficits are not persistent.   Reviewed and confirmed nursing documentation for past medical history, family history, social history.    Initial Assessment and Plan:   Patient immediately evaluated jointly byneurology and emergency department providers.   This is most consistent with an acute life/limb  threatening illness complicated by underlying chronic conditions.   Patient evaluated per code stroke protocol with immediate cross-sectional imaging of the head via CT head to evaluate for intracranial hemorrhage.  This was augmented with CT angiography and perfusional imaging of the brain for further evaluation of large vessel occlusion.   Per neurology, these rapid studies revealed no acute pathology. Neurology feels that patient's presentation is more consistent with alternative pathology given these findings.  Differential includes metabolic encephalopathy, medication encephalopathy, infectious encephalopathy. Neurology has recommended an MRI for further differentiation of patient's syndrome and evaluation for any ischemic disease while completing a metabolic/infectious workup with laboratory evaluation per EMR.  Initial Study Results: Labs Labs reviewed without evidence of clinically relevant abnormality.   EKG EKG was reviewed independently. Rate, rhythm, axis, intervals all examined and without medically relevant abnormality. ST segments without concerns for elevations.    Radiology  Images reviewed independently, agree with radiology report at this time.   MR [employer] W WO CONTRAST  Final Result    CT ANGIO HEAD NECK W WO CM (CODE STROKE)  Final Result    CT HEAD CODE STROKE WO CONTRAST  Final Result        Final Assessment and Plan:   Patient's history of present onset physicals and findings are most consistent with likely vertigo episode.  MRI is negative for acute pathology, CTA negative for acute pathology, CT head negative for acute pathology.  Symptoms completely resolved my serial reassessment.  She would like to try Antivert in the outpatient setting follow-up with outpatient neurology and outpatient orthopedic for her bicep injury. Work note to ensure she does not continue to drive while still having vertigo episodes and strict turn precautions  reinforced.  Disposition:  I have considered need for hospitalization, however, considering all of the above, I believe this patient is stable for discharge at this time.  Patient/family educated about specific return precautions for given chief complaint and symptoms.  Patient/family educated about follow-up with PCP.     Patient/family expressed understanding of return precautions and need for follow-up. Patient spoken to regarding all imaging and laboratory results and appropriate follow up for these results. All education provided in verbal form with additional information in written form. Time was allowed for answering of patient questions. Patient discharged.    Emergency Department Medication Summary:   Medications  iohexol (OMNIPAQUE) 350 MG/ML injection 75 mL (75 mLs Intravenous Contrast Given 09/24/23 1831)  gadobutrol (GADAVIST) 1 MMOL/ML injection 10 mL (10 mLs Intravenous Contrast Given 09/24/23 2025)        Clinical Impression:  1. Dizzy      Discharge   Final Clinical Impression(s) / ED Diagnoses Final diagnoses:  Dizzy    Rx / DC Orders ED Discharge Orders          Ordered    meclizine (ANTIVERT)  50 MG tablet  3 times daily PRN        09/24/23 2250              Glyn Ade, MD 09/24/23 2251

## 2023-09-24 NOTE — ED Triage Notes (Signed)
 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 PT had to pull over and pt was leaning to RT side and was dizzy. Pt attempted to deliver a package and was leaning to RT side when walking. Last known normal was 2:45PM to 3:00PM

## 2023-09-24 NOTE — ED Notes (Signed)
 Patient transported to MRI

## 2023-09-24 NOTE — ED Provider Notes (Signed)
 [facility]    CSN: [id number] Arrival date & time: 09/24/23  1722      History   Chief Complaint Chief Complaint  Patient presents with   Dizziness   Gait Problem    HPI Lynn Vargas is a 43 y.o. female.   Patient presents to urgent care today for sudden onset of dizziness, leaning to the right side, and slurred speech that began around 1445 today.  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  She endorses blurred vision on the way to urgent care today.  Reports dizziness is exacerbated when she turns her head side to side.  No recent fall, head injury, or trauma.  No headache, nausea/vomiting.  Slurred speech and blurred vision has resolved, she still has dizziness when she turns her head back and forth.  No chest pain or shortness of breath.  Denies family history of strokes.    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   Hypertension    after pregnancy   MRSA infection    Shortness of breath    UTI (lower urinary tract infection)     Patient Active Problem List   Diagnosis Date Noted   Bacterial vaginosis 02/23/2023   Acid reflux 02/23/2023   Chest heaviness 05/15/2022   Loud snoring 01/02/2022   Elevated blood pressure reading 01/02/2022   Vitamin D deficiency 09/18/2020   Elevated low density lipoprotein (LDL) cholesterol level 09/18/2020   Obesity (BMI 35.0-39.9 without comorbidity) 03/15/2018   Depression    Anxiety    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    Past Surgical History:  Procedure Laterality Date   CESAREAN SECTION     Pionidal cyst      OB History     Gravida  6   Para  3   Term  3    Preterm  0   AB  2   Living  3      SAB  0   IAB  2   Ectopic  0   Multiple  0   Live Births               Home Medications    Prior to Admission medications   Medication Sig Start Date End Date Taking? Authorizing Provider  Biotin 5000 MCG CAPS Take 1 capsule by mouth daily.    [provider]  famotidine (PEPCID) 40 MG tablet Take 1 tablet (40 mg total) by mouth daily. 04/03/23 05/03/23  Anders Simmonds T, DO  triamcinolone cream (KENALOG) 0.1 % Apply 1 Application topically 2 (two) times daily. 02/23/23   Cannady, Corrie Dandy T, NP  VITAMIN D PO Take 5,000 Units by mouth daily.    [provider]    Family History Family History  Problem Relation Age of Onset   Asthma Mother    Cancer Daughter        placenta cancer   Diabetes Maternal Aunt    Heart disease Maternal Grandmother    Stroke Neg Hx    Breast cancer Neg Hx  Social History Social History   Tobacco Use   Smoking status: Never   Smokeless tobacco: Never  Vaping Use   Vaping status: Never Used  Substance Use Topics   Alcohol use: No   Drug use: No     Allergies   Latex   Review of Systems Review of Systems Per HPI  Physical Exam Triage Vital Signs ED Triage Vitals [09/24/23 1743]  Encounter Vitals Group     BP (!) 157/100     Systolic BP Percentile      Diastolic BP Percentile      Pulse Rate 80     Resp (!) 21     Temp 98.7 F (37.1 C)     Temp src      SpO2 100 %     Weight      Height      Head Circumference      Peak Flow      Pain Score      Pain Loc      Pain Education      Exclude from Growth Chart    No data found.  Updated Vital Signs BP (!) 157/100   Pulse 80   Temp 98.7 F (37.1 C)   Resp (!) 21   LMP 09/10/2023   SpO2 100%   Visual Acuity Right Eye Distance:   Left Eye Distance:   Bilateral Distance:    Right Eye Near:   Left Eye Near:    Bilateral Near:     Physical Exam Vitals and nursing note reviewed.   Constitutional:      General: She is not in acute distress.    Appearance: Normal appearance. She is not toxic-appearing.  HENT:     Head: Normocephalic and atraumatic.     Right Ear: External ear normal.     Left Ear: External ear normal.  Eyes:     General: No scleral icterus.    Extraocular Movements: Extraocular movements intact.     Right eye: Normal extraocular motion.     Left eye: Normal extraocular motion.     Pupils: Pupils are equal, round, and reactive to light.  Cardiovascular:     Rate and Rhythm: Normal rate and regular rhythm.  Pulmonary:     Effort: Pulmonary effort is normal. No respiratory distress.     Breath sounds: Normal breath sounds. No wheezing, rhonchi or rales.  Musculoskeletal:     Cervical back: Normal range of motion.  Lymphadenopathy:     Cervical: No cervical adenopathy.  Skin:    General: Skin is warm and dry.     Capillary Refill: Capillary refill takes less than 2 seconds.     Coloration: Skin is not jaundiced or pale.     Findings: No erythema.  Neurological:     General: No focal deficit present.     Mental Status: She is alert and oriented to person, place, and time.     Cranial Nerves: Cranial nerves 2-12 are intact.     Sensory: Sensation is intact.     Motor: Motor function is intact.     Coordination: Coordination is intact. Heel to Shin Test normal.      UC Treatments / Results  Labs (all labs ordered are listed, but only abnormal results are displayed) Labs Reviewed - No data to display  EKG   Radiology No results found.  Procedures Procedures (including critical care time)  Medications Ordered in UC Medications - No data to display  Initial Impression / Assessment and Plan / UC Course  I have reviewed the triage vital signs and the nursing notes.  Pertinent labs & imaging results that were available during my care of the patient were reviewed by me and considered in my medical decision making (see chart for  details).   Patient is hypertensive and tachypneic in triage, otherwise vital signs are stable.  1. Dizziness 2. Blurred vision 3. Slurred speech Code stroke activated given slurred speech along with dizziness and leaning to one side LKW 1445 Leaning to one side and slurred speech have since resolved, she is neurovascularly intact currently [facility] activated, patient to be transported to ER and patient left urgent care in stable condition  Final Clinical Impressions(s) / UC Diagnoses   Final diagnoses:  Dizziness  Blurred vision  Slurred speech   Discharge Instructions   None    ED Prescriptions   None    PDMP not reviewed this encounter.   Valentino Nose, NP 09/24/23 [phone]

## 2023-09-24 NOTE — Code Documentation (Signed)
 Stroke Response Nurse Documentation Code Documentation  Lynn Vargas is a 43 y.o. female arriving to [facility]  via [facility] on 09/24/2023 with past medical hx of HTN. On No antithrombotic. Code stroke was activated by Urgent Care (Where she initially sought care). Patient from work where she was LKW at 1445  and now complaining of dizziness .    Stroke team at the bedside on patient arrival. Labs drawn and patient cleared for CT by EDP Patient to CT with team. NIHSS 0.  See documentation for details and code stroke times. Patient with dizziness, ataxic gait on exam. The following imaging was completed:  CT Head and CTA. Patient is not a candidate for IV Thrombolytic due to low suspiscion for stroke. Patient is not a candidate for IR due to LVO negative..   Care Plan: Q 30 NIHSS and VS till outside treatment window (19:15) , then q 2hr for 12 hr, then q 4. NPO until passes swallow screen.   Bedside handoff with ED RN complete.    Argentina Donovan  Stroke Response RN

## 2023-09-24 NOTE — Consult Note (Signed)
 NEUROLOGY CONSULT NOTE   Date of service: September 24, 2023 Patient Name: Lynn Vargas MRN:  [id number] DOB:  18-Jul-1980 Chief Complaint: "Dizziness and unsteady walking" Requesting Provider: No att. providers found  History of Present Illness  Lynn Vargas is a 43 y.o. 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 Supervisors initially came to the scene than called EMS when they noted her to have slurred speech. BP per EMS was 157/100.   Denies CP, double vision, oscillopsia and limb weakness. Endorses that her gait instability involves swaying to the left and right, but no retropulsion.  She reports some pain in her left arm, sharp in character and intermittent.  She also states that she had some neck pain which felt like muscle soreness earlier this week and denies any sharp movements or chiropractic manipulation.  She reports that this neck pain improved with gentle massage over the weekend.  She does endorse being under a lot of stress recently.  LKW: 1445 Modified rankin score: 0-Completely asymptomatic and back to baseline post- stroke IV Thrombolysis: No, too mild to treat EVT: No, no LVO  NIHSS components Score: Comment  1a Level of Conscious 0[x]  1[]  2[]  3[]      1b LOC Questions 0[x]  1[]  2[]       1c LOC Commands 0[x]  1[]  2[]       2 Best Gaze 0[x]  1[]  2[]       3 Visual 0[x]  1[]  2[]  3[]      4 Facial Palsy 0[x]  1[]  2[]  3[]      5a Motor Arm - left 0[x]  1[]  2[]  3[]  4[]  UN[]    5b Motor Arm - Right 0[x]  1[]  2[]  3[]  4[]  UN[]    6a Motor Leg - Left 0[x]  1[]  2[]  3[]  4[]  UN[]    6b Motor Leg - Right 0[x]  1[]  2[]  3[]  4[]  UN[]    7 Limb Ataxia 0[x]  1[]  2[]  3[]  UN[]     8 Sensory 0[x]  1[]  2[]  UN[]      9 Best Language 0[x]  1[]  2[]  3[]      10 Dysarthria 0[x]  1[]  2[]  UN[]      11 Extinct. 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   Hypertension    after pregnancy   MRSA infection    Shortness of breath    UTI (lower urinary tract infection)     Past Surgical History:  Procedure Laterality Date   CESAREAN SECTION     Pionidal cyst      Family History: Family History  Problem Relation Age of Onset   Asthma Mother    Cancer Daughter        placenta cancer   Diabetes Maternal Aunt    Heart disease Maternal Grandmother    Stroke Neg Hx  Breast cancer Neg Hx     Social History  reports that she has never smoked. She has never used smokeless tobacco. She reports that she does not drink alcohol and does not use drugs.  Allergies  Allergen Reactions   Latex     rash    Medications  No current facility-administered medications for this encounter.  Current Outpatient Medications:    Biotin 5000 MCG CAPS, Take 1 capsule by mouth daily., Disp: , Rfl:    famotidine (PEPCID) 40 MG tablet, Take 1 tablet (40 mg total) by mouth daily., Disp: 30 tablet, Rfl: 0   triamcinolone cream (KENALOG) 0.1 %, Apply 1 Application topically 2 (two) times daily., Disp: 80 g, Rfl: 1   VITAMIN D PO, Take 5,000 Units by mouth daily., Disp: , Rfl:   Vitals    BP (!) 163/105   Pulse 83   Temp 98.5 F (36.9 C) (Oral)   Resp 15   Wt 101 kg   LMP 09/10/2023   SpO2 100%   BMI 38.82 kg/m    Physical Exam   Constitutional: Appears well-developed and well-nourished.  Psych: Anxious affect is appropriate to situation.  Eyes: No scleral  injection.  HENT: No OP obstruction.  Head: Normocephalic.  Cardiovascular: Normal rate and regular rhythm.  Respiratory: Effort normal, non-labored breathing.  Skin: WDI.   Neurologic Examination    NEURO:  Mental Status: AA&Ox3  Speech/Language: speech is without dysarthria or aphasia.  Naming, repetition, fluency, and comprehension intact.  Cranial Nerves:  II: PERRL. Visual fields full.  III, IV, VI: EOMI. Eyelids elevate symmetrically.  V: Sensation is intact to light touch and symmetrical to face.  VII: Smile is symmetrical.  VIII: hearing intact to voice. IX, X: Phonation is normal.  ZO:[id number] shrug 5/5. XII: Tongue is midline without fasciculations. Motor: 5/5 strength to all muscle groups tested.  Tone: is normal and bulk is normal Sensation- Intact to light touch bilaterally. Extinction absent to light touch to DSS.  Coordination: FTN intact bilaterally. HKS testing shows no ataxia in BLE. Gait- Slightly unsteady gait which is also slightly wide-based.  Other: HINTS exam negative  Labs/Imaging/Neurodiagnostic studies   CBC:  Recent Labs  Lab 23-Oct-2023 1815  HGB 12.2  HCT 36.0   Basic Metabolic Panel:  Lab Results  Component Value Date   NA 138 October 23, 2023   K 3.7 23-Oct-2023   CO2 25 04/03/2023   GLUCOSE 119 (H) 10/23/23   BUN 15 23-Oct-2023   CREATININE 0.80 10/23/23   CALCIUM 8.9 04/03/2023   GFRNONAA >60 04/03/2023   GFRAA 105 08/18/2020   Lipid Panel:  Lab Results  Component Value Date   LDLCALC 115 (H) 07/24/2022   HgbA1c:  Lab Results  Component Value Date   HGBA1C 5.1 03/29/2017   CT head: No acute abnormality  CTA head and neck: No LVO and no vertebral dissection  MRI brain with and without contrast: Pending  ASSESSMENT  Lynn Vargas is a 43 y.o. female with history of HTN, anemia, anxiety, depression, gestational diabetes, HSV2, SOB, prior UTI and prior MRSA infection who presents with acute onset dizziness, unsteady gait  and slurred speech.  Patient also reports recent neck pain which resolved with massage over the weekend, which felt like muscle soreness as well as a sharp pain in the left arm.  She denies headache.  She states that she has been under higher than normal amount of stress lately.   - Head CT reveals no acute abnormalities.   -  CTA head and neck revealed no LVO and no vertebral artery dissection.   - On exam, patient does have some dizziness when standing up and preparing to ambulate, and she does lean to the right and then to the left when ambulating.  HINTS exam was negative, pointing at a peripheral cause of patient's vertigo.   - Will order vestibular evaluation by physical therapy.   - As MS could cause demyelination of vestibular nerve leading to dizziness, will obtain brain MRI with and without contrast to evaluate for this as well as a possible small posterior circulation stroke.  RECOMMENDATIONS  - MRI brain with and without contrast - PT consult for vestibular evaluation ______________________________________________________________________    Dessa Phi, Chrysta Fulcher, MD [facility]

## 2023-09-24 NOTE — ED Notes (Signed)
 Provider in room to asses PT.

## 2023-09-29 ENCOUNTER — Encounter (HOSPITAL_COMMUNITY): Payer: Self-pay

## 2023-09-29 ENCOUNTER — Ambulatory Visit (HOSPITAL_COMMUNITY)
Admission: EM | Admit: 2023-09-29 | Discharge: 2023-09-29 | Disposition: A | Discharge status: Home / Self Care | Payer: Worker's Compensation | Attending: Family Medicine | Admitting: Family Medicine

## 2023-09-29 DIAGNOSIS — M79602 Pain in left arm: Secondary | ICD-10-CM

## 2023-09-29 MED ORDER — DICLOFENAC SODIUM 75 MG PO TBEC: 75.0000 mg | DELAYED_RELEASE_TABLET | Freq: Two times a day (BID) | ORAL | 0 refills | Status: AC

## 2023-09-29 NOTE — ED Triage Notes (Signed)
 Patient presents to the office for left arm pain and numbness. Patient was seen here for workers comp injury on 09/24/2023. Patient was then transferred to ED to r/o stroke.  Patient reports no one has address her arm Patient reports arm feel tight and numb.

## 2023-09-29 NOTE — Discharge Instructions (Signed)
 Keep your orthopaedic follow up.

## 2023-09-30 ENCOUNTER — Encounter: Payer: Self-pay | Admitting: Primary Care

## 2023-10-01 NOTE — Progress Notes (Unsigned)
   Office Visit Note   Patient: Lynn Vargas           Date of Birth: 09/21/1980           MRN: [id number] Visit Date: 10/02/2023              Requested by:  Larae Grooms, NP [address] [place],  [place] [address]  PCP: Larae Grooms, NP   Assessment & Plan: Visit Diagnoses: No diagnosis found.  Plan: ***  Follow-Up Instructions: No follow-ups on file.   Orders:  No orders of the defined types were placed in this encounter. No orders of the defined types were placed in this encounter.    Procedures: No procedures performed   Clinical Data: No additional findings.   Subjective: No chief complaint on file.  HPI  Review of Systems   Objective: Vital Signs: LMP 09/10/2023   Physical Exam  Ortho Exam  Specialty Comments:  No specialty comments available.  Imaging: No results found.   PMFS History: Patient Active Problem List   Diagnosis Date Noted  . Bacterial vaginosis 02/23/2023  . Acid reflux 02/23/2023  . Chest heaviness 05/15/2022  . Loud snoring 01/02/2022  . Elevated blood pressure reading 01/02/2022  . Vitamin D deficiency 09/18/2020  . Elevated low density lipoprotein (LDL) cholesterol level 09/18/2020  . Obesity (BMI 35.0-39.9 without comorbidity) 03/15/2018  . Depression   . Anxiety   . 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   Past Medical History:  Diagnosis Date  . Anemia   . Anxiety   . Cyst of perianal area    removed  . Depression   . 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 Gestational diabetes    diet controlled  . 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  . Hypertension    after pregnancy  . MRSA infection   . Shortness of breath   . UTI (lower urinary tract infection)     Family History  Problem Relation Age of Onset  . Asthma Mother   . Cancer Daughter        placenta cancer  . Diabetes Maternal Aunt    . Heart disease Maternal Grandmother   . Stroke Neg Hx   . Breast cancer Neg Hx     Past Surgical History:  Procedure Laterality Date  . CESAREAN SECTION    . Pionidal cyst     Social History   Occupational History  . Not on file  Tobacco Use  . Smoking status: Never  . Smokeless tobacco: Never  Vaping Use  . Vaping status: Never Used  Substance and Sexual Activity  . Alcohol use: No  . Drug use: No  . Sexual activity: Not Currently

## 2023-10-02 ENCOUNTER — Encounter: Payer: Self-pay | Admitting: Orthopaedic Surgery

## 2023-10-02 ENCOUNTER — Ambulatory Visit: Admitting: Orthopaedic Surgery

## 2023-10-02 ENCOUNTER — Telehealth: Payer: Self-pay | Admitting: Orthopaedic Surgery

## 2023-10-02 DIAGNOSIS — M79602 Pain in left arm: Secondary | ICD-10-CM | POA: Diagnosis not present

## 2023-10-02 NOTE — Telephone Encounter (Signed)
 Pt submitted medical release form, disability forms, and cash payment of $20.00. Accepted 10/02/23

## 2023-10-02 NOTE — Telephone Encounter (Signed)
 Pt refunded $20.00 in cash payment due to system not accepting payment on ipay.

## 2023-10-03 NOTE — ED Provider Notes (Signed)
 [facility]   [id number] 09/29/23 Arrival Time: 1610  ASSESSMENT & PLAN:  1. Left arm pain   Ques biceps issued. Discussed. No bony TTP of LUE. LUE with FROM. Trial of: Meds ordered this encounter  Medications   diclofenac (VOLTAREN) 75 MG EC tablet    Sig: Take 1 tablet (75 mg total) by mouth 2 (two) times daily.    Dispense:  14 tablet    Refill:  0   Has ortho f/u in a few days to discuss.    Reviewed expectations re: course of current medical issues. Questions answered. Outlined signs and symptoms indicating need for more acute intervention. Understanding verbalized. After Visit Summary given.   SUBJECTIVE: History from: Patient. Lynn Vargas is a 43 y.o. female. Patient presents to the office for left arm pain and numbness. Patient was seen here for workers comp injury on 09/24/2023. Patient was then transferred to ED to r/o stroke.  Patient reports no one has address her arm Patient reports arm feel tight and numb. Randomly; not curre nlty. Prev ED notes reviewed. Here with continued LUE pain around biceps muscle.  OBJECTIVE:  Vitals:   09/29/23 1649  BP: 137/89  Pulse: 87  Resp: 16  Temp: 98.5 F (36.9 C)  TempSrc: Oral  SpO2: 98%    General appearance: alert; no distress Neck: supple  Lungs: speaks full sentences without difficulty; unlabored Extremities: no edema; no bunching of muscle over upper LUE; is TTP over distal biceps; LUE with normal ROM/supination/pronation/grip strength Skin: warm and dry Neurologic: normal gait Psychological: alert and cooperative; normal mood and affect  Labs:  Labs Reviewed - No data to display  Imaging: No results found.  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   Hypertension    after pregnancy   MRSA infection    Shortness of breath    UTI (lower urinary tract infection)    Social History   Socioeconomic History   Marital status: Single    Spouse name: Not on file   Number of children: Not on file   Years of education: Not on file   Highest education level: Not on file  Occupational History   Not on file  Tobacco Use   Smoking status: Never   Smokeless tobacco: Never  Vaping Use   Vaping status: Never Used  Substance and Sexual Activity   Alcohol use: No   Drug use: No   Sexual activity: Not Currently  Other Topics Concern   Not on file  Social History Narrative   Not on file   Social Drivers of Health   Financial Resource Strain: Not on file  Food Insecurity: Not on file  Transportation Needs: Not on file  Physical Activity: Not on file  Stress: Not on file  Social Connections: Not on file  Intimate Partner Violence: Not on file   Family History  Problem Relation Age of Onset   Asthma Mother    Cancer Daughter        placenta cancer   Diabetes  Maternal Aunt    Heart disease Maternal Grandmother    Stroke Neg Hx    Breast cancer Neg Hx    Past Surgical History:  Procedure Laterality Date   CESAREAN SECTION     Pionidal cyst       Mardella Layman, MD 10/03/23 1024

## 2023-11-01 ENCOUNTER — Ambulatory Visit (INDEPENDENT_AMBULATORY_CARE_PROVIDER_SITE_OTHER): Admitting: Orthopaedic Surgery

## 2023-11-01 ENCOUNTER — Encounter: Payer: Self-pay | Admitting: Orthopaedic Surgery

## 2023-11-01 DIAGNOSIS — M79602 Pain in left arm: Secondary | ICD-10-CM | POA: Diagnosis not present

## 2023-11-01 NOTE — Progress Notes (Signed)
 Office Visit Note   Patient: Lynn Vargas           Date of Birth: 1981/07/08           MRN: [id number] Visit Date: 11/01/2023              Requested by: Aileen Alexanders, NP [address] [place],  [place] [address] PCP: Aileen Alexanders, NP   Assessment & Plan: Visit Diagnoses:  1. Left arm pain     Plan: Ms. Hocevar is a 43 year old female with left shoulder pain that occurred at work.  We will see what the MRI shows and go from there.  She will follow-up after the MRI.  Will hold off on any physical therapy until we have the MRI.  Work note provided.  Follow-Up Instructions: Return in about 4 weeks (around 11/29/2023).   Orders:  Orders Placed This Encounter  Procedures   Ambulatory referral to Physical Therapy   No orders of the defined types were placed in this encounter.     Procedures: No procedures performed   Clinical Data: No additional findings.   Subjective: Chief Complaint  Patient presents with   Left Arm - Follow-up    HPI Patient returns today for follow-up evaluation of left arm pain.  Feels like her arm is improving overall.  She has pain along the lateral deltoid.  She has an MRI scheduled for this coming Monday at [place] [facility].  Currently not taking any medications. Review of Systems  Constitutional: Negative.   HENT: Negative.    Eyes: Negative.   Respiratory: Negative.    Cardiovascular: Negative.   Endocrine: Negative.   Musculoskeletal: Negative.   Neurological: Negative.   Hematological: Negative.   Psychiatric/Behavioral: Negative.    All other systems reviewed and are negative.    Objective: Vital Signs: There were no vitals taken for this visit.  Physical Exam Vitals and nursing note reviewed.  Constitutional:      Appearance: She is well-developed.  HENT:     Head: Atraumatic.     Nose: Nose normal.  Eyes:     Extraocular Movements: Extraocular movements intact.  Cardiovascular:     Pulses: Normal  pulses.  Pulmonary:     Effort: Pulmonary effort is normal.  Abdominal:     Palpations: Abdomen is soft.  Musculoskeletal:     Cervical back: Neck supple.  Skin:    General: Skin is warm.     Capillary Refill: Capillary refill takes less than 2 seconds.  Neurological:     Mental Status: She is alert. Mental status is at baseline.  Psychiatric:        Behavior: Behavior normal.        Thought Content: Thought content normal.        Judgment: Judgment normal.     Ortho Exam Examination shows good strength with slight weakness secondary to pain to manual muscle testing of the supraspinatus and infraspinatus.  No impingement signs. Specialty Comments:  No specialty comments available.  Imaging: No results found.   PMFS History: Patient Active Problem List   Diagnosis Date Noted   Bacterial vaginosis 02/23/2023   Acid reflux 02/23/2023   Chest heaviness 05/15/2022   Loud snoring 01/02/2022   Elevated blood pressure reading 01/02/2022   Vitamin D  deficiency 09/18/2020   Elevated low density lipoprotein (LDL) cholesterol level 09/18/2020   Obesity (BMI 35.0-39.9 without comorbidity) 03/15/2018   Depression    Anxiety    HSV antigen DIF  positive 04/14/2011   Past Medical History:  Diagnosis Date   Anemia    Anxiety    Cyst of perianal area    removed   Depression    Fetal abnormality in antepartum pregnancy 2012   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   Hypertension    after pregnancy   MRSA infection    Shortness of breath    UTI (lower urinary tract infection)     Family History  Problem Relation Age of Onset   Asthma Mother    Cancer Daughter        placenta cancer   Diabetes Maternal Aunt    Heart disease Maternal Grandmother    Stroke Neg Hx    Breast cancer Neg Hx     Past Surgical History:  Procedure Laterality  Date   CESAREAN SECTION     Pionidal cyst     Social History   Occupational History   Not on file  Tobacco Use   Smoking status: Never   Smokeless tobacco: Never  Vaping Use   Vaping status: Never Used  Substance and Sexual Activity   Alcohol use: No   Drug use: No   Sexual activity: Not Currently

## 2023-11-13 ENCOUNTER — Ambulatory Visit (INDEPENDENT_AMBULATORY_CARE_PROVIDER_SITE_OTHER): Admitting: Orthopaedic Surgery

## 2023-11-13 DIAGNOSIS — M25512 Pain in left shoulder: Secondary | ICD-10-CM | POA: Diagnosis not present

## 2023-11-13 MED ORDER — BUPIVACAINE HCL 0.5 % IJ SOLN
3.0000 mL | INTRAMUSCULAR | Status: AC | PRN
  Administered 2023-11-13: 3 mL via INTRA_ARTICULAR

## 2023-11-13 MED ORDER — METHYLPREDNISOLONE ACETATE 40 MG/ML IJ SUSP
40.0000 mg | INTRAMUSCULAR | Status: AC | PRN
Start: 2023-11-13 — End: 2023-11-13
  Administered 2023-11-13: 40 mg via INTRA_ARTICULAR

## 2023-11-13 MED ORDER — LIDOCAINE HCL 1 % IJ SOLN
3.0000 mL | INTRAMUSCULAR | Status: AC | PRN
  Administered 2023-11-13: 3 mL

## 2023-11-13 NOTE — Progress Notes (Signed)
 Office Visit Note   Patient: Lynn Vargas           Date of Birth: Apr 06, 1981           MRN: [id number] Visit Date: 11/13/2023              Requested by: Aileen Alexanders, NP [address] [place],  [place] [address] PCP: Aileen Alexanders, NP   Assessment & Plan: Visit Diagnoses:  1. Acute pain of left shoulder     Plan: History of Present Illness Lynn Vargas is a 43 year old female who presents with shoulder pain.  She experiences shoulder pain, particularly when sleeping on the affected side, which exacerbates the discomfort. An MRI of the humerus identified a small partial thickness tear of the rotator cuff and bursitis. The pain improves overall but recurs with certain activities, such as sleeping on the affected side.  She has not yet undergone physical therapy for her shoulder. She wants to ensure full healing before returning to work, as her employer requires her to be fully healed before resuming duties.  Physical Exam No changes from prior visit  Results RADIOLOGY Cervical spine MRI: Mild degenerative changes, small bulging disc at C5-6 on the right side Humerus MRI: Bursitis, small partial thickness tear of the supraspinatus  Assessment and Plan Partial rotator cuff tear and bursitis Partial thickness supraspinatus tear and bursitis should be able to be managed conservatively.  Subacromial cortisone injection and physical therapy recommended for symptom relief and recovery. - Administer cortisone injection today. - Recommend 6 weeks of physical therapy for shoulder rehabilitation. - Refer to [facility] in [place]. - Advise 6 weeks off work for recovery.  Cervical disc disorder with radiculopathy Mild degenerative changes with small bulging disc at C5-6. No surgical intervention needed. - symptoms do not correlate with clinical findings  Follow-Up Instructions: No follow-ups on file.   Orders:  Orders Placed This Encounter   Procedures   Ambulatory referral to Physical Therapy   No orders of the defined types were placed in this encounter.     Procedures: Large Joint Inj: L subacromial bursa on 11/13/2023 6:48 PM Indications: pain Details: 22 G needle  Arthrogram: No  Medications: 3 mL lidocaine  1 %; 3 mL bupivacaine 0.5 %; 40 mg methylPREDNISolone acetate 40 MG/ML Outcome: tolerated well, no immediate complications Patient was prepped and draped in the usual sterile fashion.       Clinical Data: No additional findings.   Subjective: Chief Complaint  Patient presents with   Left Arm - Pain    HPI  Review of Systems  Constitutional: Negative.   HENT: Negative.    Eyes: Negative.   Respiratory: Negative.    Cardiovascular: Negative.   Endocrine: Negative.   Musculoskeletal: Negative.   Neurological: Negative.   Hematological: Negative.   Psychiatric/Behavioral: Negative.    All other systems reviewed and are negative.    Objective: Vital Signs: There were no vitals taken for this visit.  Physical Exam Vitals and nursing note reviewed.  Constitutional:      Appearance: She is well-developed.  HENT:     Head: Atraumatic.     Nose: Nose normal.  Eyes:     Extraocular Movements: Extraocular movements intact.  Cardiovascular:     Pulses: Normal pulses.  Pulmonary:     Effort: Pulmonary effort is normal.  Abdominal:     Palpations: Abdomen is soft.  Musculoskeletal:     Cervical back: Neck supple.  Skin:  General: Skin is warm.     Capillary Refill: Capillary refill takes less than 2 seconds.  Neurological:     Mental Status: She is alert. Mental status is at baseline.  Psychiatric:        Behavior: Behavior normal.        Thought Content: Thought content normal.        Judgment: Judgment normal.     Ortho Exam  Specialty Comments:  No specialty comments available.  Imaging: No results found.   PMFS History: Patient Active Problem List   Diagnosis Date  Noted   Bacterial vaginosis 02/23/2023   Acid reflux 02/23/2023   Chest heaviness 05/15/2022   Loud snoring 01/02/2022   Elevated blood pressure reading 01/02/2022   Vitamin D  deficiency 09/18/2020   Elevated low density lipoprotein (LDL) cholesterol level 09/18/2020   Obesity (BMI 35.0-39.9 without comorbidity) 03/15/2018   Depression    Anxiety    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   Past Medical History:  Diagnosis Date   Anemia    Anxiety    Cyst of perianal area    removed   Depression    Fetal abnormality in antepartum pregnancy 2012   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   Hypertension    after pregnancy   MRSA infection    Shortness of breath    UTI (lower urinary tract infection)     Family History  Problem Relation Age of Onset   Asthma Mother    Cancer Daughter        placenta cancer   Diabetes Maternal Aunt    Heart disease Maternal Grandmother    Stroke Neg Hx    Breast cancer Neg Hx     Past Surgical History:  Procedure Laterality Date   CESAREAN SECTION     Pionidal cyst     Social History   Occupational History   Not on file  Tobacco Use   Smoking status: Never   Smokeless tobacco: Never  Vaping Use   Vaping status: Never Used  Substance and Sexual Activity   Alcohol use: No   Drug use: No   Sexual activity: Not Currently

## 2023-11-20 ENCOUNTER — Telehealth: Payer: Self-pay | Admitting: Orthopaedic Surgery

## 2023-11-20 NOTE — Telephone Encounter (Signed)
 She can be out of work until June 24.

## 2023-11-20 NOTE — Telephone Encounter (Signed)
 Patient called and said that since she was [facility] denied her can you make a referral for just PT just using her insurance and also will you extend her going back to work because of the denial. CB#[phone]

## 2023-11-20 NOTE — Telephone Encounter (Signed)
 Please advise about return to work date.

## 2023-11-21 ENCOUNTER — Other Ambulatory Visit: Payer: Self-pay

## 2023-11-21 DIAGNOSIS — M25512 Pain in left shoulder: Secondary | ICD-10-CM

## 2023-11-21 NOTE — Telephone Encounter (Signed)
 Spoke with patient. New note provided and sent to MyChart account.  New order for PT entered and sent to [facility] in [place] as requested.

## 2023-12-25 ENCOUNTER — Ambulatory Visit: Admitting: Orthopaedic Surgery

## 2023-12-26 ENCOUNTER — Ambulatory Visit (INDEPENDENT_AMBULATORY_CARE_PROVIDER_SITE_OTHER): Admitting: Orthopaedic Surgery

## 2023-12-26 DIAGNOSIS — M25512 Pain in left shoulder: Secondary | ICD-10-CM

## 2023-12-26 NOTE — Progress Notes (Signed)
 Office Visit Note   Patient: Lynn Vargas           Date of Birth: March 03, 1981           MRN: [id number] Visit Date: 12/26/2023              Requested by: Aileen Alexanders, NP [address] [place],  [place] [address] PCP: Aileen Alexanders, NP   Assessment & Plan: Visit Diagnoses:  1. Acute pain of left shoulder     Plan: History of Present Illness Lynn Vargas is a 43 year old female who presents for follow up of left shoulder pain.  Pending work comp injury.  She experiences pain when lifting her arm straight up and extending it, with limited range of motion. Physical therapy is beneficial, but certain exercises are restricted due to pain. She is seeking worker's compensation approval and currently pays for treatments out of pocket.  Physical Exam MUSCULOSKELETAL: Restricted range of motion in shoulder of about 10-15 degrees with flexion, abduction, ER.  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 Symptoms improving with physical therapy, but some movements remain painful. - Refer to Dr. Vaughn Georges for intraarticular cortisone injection. - Continue physical therapy until resolution. - Proceed with injection pending workers' compensation approval.  Follow-Up Instructions: No follow-ups on file.   Orders:  No orders of the defined types were placed in this encounter.  No orders of the defined types were placed in this encounter.    Subjective: Chief Complaint  Patient presents with   Left Shoulder - Pain    HPI  Review of Systems  Constitutional: Negative.   HENT: Negative.    Eyes: Negative.   Respiratory: Negative.    Cardiovascular: Negative.   Endocrine: Negative.   Musculoskeletal: Negative.   Neurological: Negative.   Hematological: Negative.    Psychiatric/Behavioral: Negative.    All other systems reviewed and are negative.    Objective: Vital Signs: There were no vitals taken for this visit.  Physical Exam Vitals and nursing note reviewed.  Constitutional:      Appearance: She is well-developed.  HENT:     Head: Normocephalic and atraumatic.  Pulmonary:     Effort: Pulmonary effort is normal.  Abdominal:     Palpations: Abdomen is soft.   Musculoskeletal:     Cervical back: Neck supple.   Skin:    General: Skin is warm.     Capillary Refill: Capillary refill takes less than 2 seconds.   Neurological:     Mental Status: She is alert and oriented to person, place, and time.   Psychiatric:        Behavior: Behavior normal.        Thought Content: Thought content normal.        Judgment: Judgment normal.     PMFS History: Patient Active Problem List   Diagnosis Date Noted   Bacterial vaginosis 02/23/2023   Acid reflux 02/23/2023   Chest heaviness 05/15/2022   Loud snoring 01/02/2022   Elevated blood pressure reading 01/02/2022   Vitamin D  deficiency 09/18/2020   Elevated low density lipoprotein (LDL) cholesterol level 09/18/2020   Obesity (BMI 35.0-39.9 without comorbidity) 03/15/2018   Depression    Anxiety    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   Past Medical History:  Diagnosis Date  Anemia    Anxiety    Cyst of perianal area    removed   Depression    Fetal abnormality in antepartum pregnancy 2012   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   Hypertension    after pregnancy   MRSA infection    Shortness of breath    UTI (lower urinary tract infection)     Family History  Problem Relation Age of Onset   Asthma Mother    Cancer Daughter        placenta cancer   Diabetes Maternal Aunt    Heart disease Maternal Grandmother    Stroke Neg Hx    Breast  cancer Neg Hx     Past Surgical History:  Procedure Laterality Date   CESAREAN SECTION     Pionidal cyst     Social History   Occupational History   Not on file  Tobacco Use   Smoking status: Never   Smokeless tobacco: Never  Vaping Use   Vaping status: Never Used  Substance and Sexual Activity   Alcohol use: No   Drug use: No   Sexual activity: Not Currently

## 2024-01-04 ENCOUNTER — Encounter: Payer: Self-pay | Admitting: Sports Medicine

## 2024-01-04 ENCOUNTER — Ambulatory Visit (INDEPENDENT_AMBULATORY_CARE_PROVIDER_SITE_OTHER): Admitting: Sports Medicine

## 2024-01-04 ENCOUNTER — Other Ambulatory Visit: Payer: Self-pay

## 2024-01-04 DIAGNOSIS — M7502 Adhesive capsulitis of left shoulder: Secondary | ICD-10-CM

## 2024-01-04 DIAGNOSIS — M25512 Pain in left shoulder: Secondary | ICD-10-CM | POA: Diagnosis not present

## 2024-01-04 MED ORDER — LIDOCAINE HCL 1 % IJ SOLN
2.0000 mL | INTRAMUSCULAR | Status: AC | PRN
  Administered 2024-01-04: 2 mL

## 2024-01-04 MED ORDER — METHYLPREDNISOLONE ACETATE 40 MG/ML IJ SUSP
80.0000 mg | INTRAMUSCULAR | Status: AC | PRN
  Administered 2024-01-04: 80 mg via INTRA_ARTICULAR

## 2024-01-04 MED ORDER — BUPIVACAINE HCL 0.25 % IJ SOLN
2.0000 mL | INTRAMUSCULAR | Status: AC | PRN
  Administered 2024-01-04: 2 mL via INTRA_ARTICULAR

## 2024-01-04 NOTE — Progress Notes (Signed)
   Procedure Note  Patient: Lynn Vargas             Date of Birth: [birth year]           MRN: [id number]             Visit Date: 01/04/2024  Procedures: Visit Diagnoses:  1. Acute pain of left shoulder   2. Adhesive capsulitis of left shoulder    Large Joint Inj: L glenohumeral on 01/04/2024 9:30 AM Indications: pain Details: 22 G 3.5 in needle, ultrasound-guided posterior approach Medications: 2 mL lidocaine  1 %; 2 mL bupivacaine  0.25 %; 80 mg methylPREDNISolone  acetate 40 MG/ML Outcome: tolerated well, no immediate complications  US -guided glenohumeral joint injection, left shoulder After discussion on risks/benefits/indications, informed verbal consent was obtained. A timeout was then performed. The patient was positioned lying lateral recumbent on examination table. The patient's shoulder was prepped with betadine and multiple alcohol swabs and utilizing ultrasound guidance, the patient's glenohumeral joint was identified on ultrasound. Using ultrasound guidance a 22-gauge, 3.5 inch needle with a mixture of 2:2:2 cc's lidocaine :bupivicaine:depomedrol was directed from a lateral to medial direction via in-plane technique into the glenohumeral joint with visualization of appropriate spread of injectate into the joint. Patient tolerated the procedure well without immediate complications.      Procedure, treatment alternatives, risks and benefits explained, specific risks discussed. Consent was given by the patient. Immediately prior to procedure a time out was called to verify the correct patient, procedure, equipment, support staff and site/side marked as required. Patient was prepped and draped in the usual sterile fashion.     - f/u in 6 weeks with Dr. Jerri for repeat evaluation and re-evaluation of ROM. Could consider additional high-volume injection at that time only if not significantly improved, will leave this up to Dr. Jerri - post-injection protocol discussed   Lonell Sprang,  DO Primary Care Sports Medicine Physician  [facility] - Orthopedics  This note was dictated using Dragon naturally speaking software and may contain errors in syntax, spelling, or content which have not been identified prior to signing this note.

## 2024-02-06 ENCOUNTER — Ambulatory Visit (INDEPENDENT_AMBULATORY_CARE_PROVIDER_SITE_OTHER): Admitting: Orthopaedic Surgery

## 2024-02-06 DIAGNOSIS — M25512 Pain in left shoulder: Secondary | ICD-10-CM

## 2024-02-06 DIAGNOSIS — G8929 Other chronic pain: Secondary | ICD-10-CM | POA: Diagnosis not present

## 2024-02-06 NOTE — Progress Notes (Signed)
 Office Visit Note   Patient: Lynn Vargas           Date of Birth: 13-Oct-1980           MRN: [id number] Visit Date: 02/06/2024              Requested by: Melvin Pao, NP [address] [place],  [place] [address] PCP: Melvin Pao, NP   Assessment & Plan: Visit Diagnoses:  1. Chronic left shoulder pain     Plan: History of Present Illness Lynn Vargas is a 43 year old female who presents with ongoing shoulder soreness and limited flexibility following an injury.  She experiences a pulling sensation in her shoulder during overhead movements, which is not present in her other arm. She is not using any medication for pain, describing the discomfort as soreness rather than sharp pain. She attends physical therapy once a week at [facility], where overhead exercises have been added. She is concerned about the persistent soreness and slow progress in flexibility. She has been out of work for approximately four months due to her shoulder condition, as her job requires physical activity and lacks light duty options.  Exam left shoulder: - normal ROM with slight pain towards extremes - normal strength  Assessment and Plan Left shoulder pain Ongoing pain and limited flexibility with soreness and pulling sensation. Weekly physical therapy shows improvement but limitations persist. Concern about work impact due to job's physical demands. - Continue physical therapy weekly for 8 weeks. - Keep out of work for two months. - Consider functional capacity evaluation if not at maximum medical improvement after two months. - Reassess in eight weeks.  Follow-Up Instructions: No follow-ups on file.   Orders:  No orders of the defined types were placed in this encounter.  No orders of the defined types were placed in this encounter.     Procedures: No procedures performed   Clinical Data: No additional findings.   Subjective: Chief Complaint  Patient presents  with   Left Shoulder - Pain    HPI  Review of Systems  Constitutional: Negative.   HENT: Negative.    Eyes: Negative.   Respiratory: Negative.    Cardiovascular: Negative.   Endocrine: Negative.   Musculoskeletal: Negative.   Neurological: Negative.   Hematological: Negative.   Psychiatric/Behavioral: Negative.    All other systems reviewed and are negative.    Objective: Vital Signs: There were no vitals taken for this visit.  Physical Exam Vitals and nursing note reviewed.  Constitutional:      Appearance: She is well-developed.  HENT:     Head: Normocephalic and atraumatic.  Pulmonary:     Effort: Pulmonary effort is normal.  Abdominal:     Palpations: Abdomen is soft.  Musculoskeletal:     Cervical back: Neck supple.  Skin:    General: Skin is warm.     Capillary Refill: Capillary refill takes less than 2 seconds.  Neurological:     Mental Status: She is alert and oriented to person, place, and time.  Psychiatric:        Behavior: Behavior normal.        Thought Content: Thought content normal.        Judgment: Judgment normal.     Ortho Exam  Specialty Comments:  No specialty comments available.  Imaging: No results found.   PMFS History: Patient Active Problem List   Diagnosis Date Noted   Bacterial vaginosis 02/23/2023   Acid reflux  02/23/2023   Chest heaviness 05/15/2022   Loud snoring 01/02/2022   Elevated blood pressure reading 01/02/2022   Vitamin D  deficiency 09/18/2020   Elevated low density lipoprotein (LDL) cholesterol level 09/18/2020   Obesity (BMI 35.0-39.9 without comorbidity) 03/15/2018   Depression    Anxiety    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   Past Medical History:  Diagnosis Date   Anemia    Anxiety    Cyst of perianal area    removed   Depression    Fetal abnormality in antepartum pregnancy 2012   fetus 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   Hypertension    after pregnancy   MRSA infection    Shortness of breath    UTI (lower urinary tract infection)     Family History  Problem Relation Age of Onset   Asthma Mother    Cancer Daughter        placenta cancer   Diabetes Maternal Aunt    Heart disease Maternal Grandmother    Stroke Neg Hx    Breast cancer Neg Hx     Past Surgical History:  Procedure Laterality Date   CESAREAN SECTION     Pionidal cyst     Social History   Occupational History   Not on file  Tobacco Use   Smoking status: Never   Smokeless tobacco: Never  Vaping Use   Vaping status: Never Used  Substance and Sexual Activity   Alcohol use: No   Drug use: No   Sexual activity: Not Currently

## 2024-04-02 ENCOUNTER — Ambulatory Visit (INDEPENDENT_AMBULATORY_CARE_PROVIDER_SITE_OTHER): Admitting: Orthopaedic Surgery

## 2024-04-02 DIAGNOSIS — M7502 Adhesive capsulitis of left shoulder: Secondary | ICD-10-CM

## 2024-04-02 DIAGNOSIS — M25512 Pain in left shoulder: Secondary | ICD-10-CM | POA: Diagnosis not present

## 2024-04-02 DIAGNOSIS — G8929 Other chronic pain: Secondary | ICD-10-CM | POA: Diagnosis not present

## 2024-04-02 NOTE — Progress Notes (Signed)
   Office Visit Note   Patient: Lynn Vargas           Date of Birth: 1981-02-27           MRN: [id number] Visit Date: 04/02/2024              Requested by: Melvin Pao, NP [address] [place],  [place] [address] PCP: Melvin Pao, NP   Assessment & Plan: Visit Diagnoses:  1. Chronic left shoulder pain   2. Adhesive capsulitis of left shoulder     Plan: History of Present Illness Lynn Vargas is a 43 year old female who follows up for work comp left shoulder injury.    She experiences significant improvement in arm pain following an injection, with substantial relief noted. Soreness persists with arm movement but has improved since the last visit.  She is about 6 months status post the injury.  She attends physical therapy once a week, which she finds beneficial. Concerns exist about balancing therapy with her work schedule as a Loss adjuster, chart[employer], which requires no restrictions. She emphasizes the importance of full recovery before returning to work to prevent setbacks.  Exam of the left shoulder shows normal range of motion without pain.  Normal strength with manual muscle testing.  Assessment and Plan Left arm injury with ongoing pain and functional limitation Significant improvement post-injection. Physical therapy beneficial. Discussed work balance and potential setbacks. - Provide work release note allowing return to work with condition of attending physical therapy once a week. - Continue physical therapy sessions once a week. - Discuss with employer the possibility of accommodating physical therapy appointments. - Consider functional capacity evaluation if setback occurs or work restrictions are needed.  Total face to face encounter time was greater than 25 minutes and over half of this time was spent in counseling and/or coordination of care.  Follow-Up Instructions: No follow-ups on file.   Orders:  No orders of the defined types were placed in this  encounter.  No orders of the defined types were placed in this encounter.

## 2024-05-12 ENCOUNTER — Encounter: Payer: Self-pay | Admitting: Radiology

## 2024-06-10 ENCOUNTER — Encounter: Admitting: Nurse Practitioner

## 2024-06-24 ENCOUNTER — Ambulatory Visit: Admitting: Orthopaedic Surgery

## 2024-06-24 ENCOUNTER — Telehealth: Payer: Self-pay

## 2024-06-24 DIAGNOSIS — G8929 Other chronic pain: Secondary | ICD-10-CM

## 2024-06-24 NOTE — Telephone Encounter (Signed)
 Workers Comp order for PT entered into chart.  Patient requesting [facility], but has currently been going to [facility]. Hard copy script given to patient as well.

## 2024-06-24 NOTE — Progress Notes (Signed)
 Office Visit Note   Patient: Lynn Vargas           Date of Birth: 1980/08/26           MRN: [id number] Visit Date: 06/24/2024              Requested by: Melvin Pao, NP [address] [place],  [place] [address] PCP: Melvin Pao, NP   Assessment & Plan: Visit Diagnoses:  1. Chronic left shoulder pain     Plan: History of Present Illness Lynn Vargas is a 43 year old female with a partial tear of the left supraspinatus tendon who presents with recurrent left shoulder pain and functional limitation.  Two weeks ago, she had acute left shoulder pain and weakness while lifting a box, describing her arm as having given out. Pain was initially 7/10 and improved over several days, with a similar episode when lifting a backpack. She now has persistent right shoulder pain at 1-2/10 and feels her range of motion is decreased compared to baseline.  She has continued physical therapy but notes increased pain, a sense of regression, and that the most recent session was more painful than prior visits. She is concerned about limited individualized attention and inadequate focus on certain muscle groups, and she requests referral to a different PT office for more focused care.  She continues to work at [employer] with a helper. She is self-paying because worker's compensation is not covering her treatment and has legal representation with a hearing scheduled in March.  Physical Exam MUSCULOSKELETAL: Shoulder flexibility is good but painful on movement. Supraspinatus test elicits minor pain.  Excellent strength.  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 No complete rupture. Symptoms improving but not at baseline. Healing expected with time. - Prescribed referral to new physical therapy office [facility]) for individualized  care. - Advised continuation of physical therapy to support recovery and strengthen compensatory musculature.  Follow-Up Instructions: No follow-ups on file.   Orders:  Orders Placed This Encounter  Procedures   Ambulatory referral to Physical Therapy   No orders of the defined types were placed in this encounter.     Procedures: No procedures performed   Clinical Data: No additional findings.   Subjective: Chief Complaint  Patient presents with   Left Shoulder - Pain    HPI  Review of Systems  Constitutional: Negative.   HENT: Negative.    Eyes: Negative.   Respiratory: Negative.    Cardiovascular: Negative.   Endocrine: Negative.   Musculoskeletal: Negative.   Neurological: Negative.   Hematological: Negative.   Psychiatric/Behavioral: Negative.    All other systems reviewed and are negative.    Objective: Vital Signs: There were no vitals taken for this visit.  Physical Exam Vitals and nursing note reviewed.  Constitutional:      Appearance: She is well-developed.  HENT:     Head: Atraumatic.     Nose: Nose normal.  Eyes:     Extraocular Movements: Extraocular movements intact.  Cardiovascular:     Pulses: Normal pulses.  Pulmonary:     Effort: Pulmonary effort is normal.  Abdominal:     Palpations: Abdomen is soft.  Musculoskeletal:     Cervical back: Neck supple.  Skin:    General: Skin is warm.     Capillary Refill: Capillary refill takes less than 2 seconds.  Neurological:     Mental Status: She is alert. Mental status is at baseline.  Psychiatric:        Behavior: Behavior normal.        Thought Content: Thought content normal.        Judgment: Judgment normal.     Ortho Exam  Specialty Comments:  No specialty comments available.  Imaging: No results found.   PMFS History: Patient Active Problem List   Diagnosis Date Noted   Bacterial vaginosis 02/23/2023   Acid reflux 02/23/2023   Chest heaviness 05/15/2022   Loud  snoring 01/02/2022   Elevated blood pressure reading 01/02/2022   Vitamin D  deficiency 09/18/2020   Elevated low density lipoprotein (LDL) cholesterol level 09/18/2020   Obesity (BMI 35.0-39.9 without comorbidity) 03/15/2018   Depression    Anxiety    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   Past Medical History:  Diagnosis Date   Anemia    Anxiety    Cyst of perianal area    removed   Depression    Fetal abnormality in antepartum pregnancy 2012   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   Hypertension    after pregnancy   MRSA infection    Shortness of breath    UTI (lower urinary tract infection)     Family History  Problem Relation Age of Onset   Asthma Mother    Cancer Daughter        placenta cancer   Diabetes Maternal Aunt    Heart disease Maternal Grandmother    Stroke Neg Hx    Breast cancer Neg Hx     Past Surgical History:  Procedure Laterality Date   CESAREAN SECTION     Pionidal cyst     Social History   Occupational History   Not on file  Tobacco Use   Smoking status: Never   Smokeless tobacco: Never  Vaping Use   Vaping status: Never Used  Substance and Sexual Activity   Alcohol use: No   Drug use: No   Sexual activity: Not Currently
